# Patient Record
Sex: Male | Born: 1950 | Race: White | Hispanic: No | Marital: Married | Smoking: Never smoker
Health system: Southern US, Community
[De-identification: ages and names within clinical notes are randomized; demographics above are authoritative.]

## PROBLEM LIST (undated history)

## (undated) DIAGNOSIS — K219 Gastro-esophageal reflux disease without esophagitis: Secondary | ICD-10-CM

## (undated) DIAGNOSIS — E785 Hyperlipidemia, unspecified: Secondary | ICD-10-CM

## (undated) DIAGNOSIS — E7800 Pure hypercholesterolemia, unspecified: Secondary | ICD-10-CM

## (undated) DIAGNOSIS — E119 Type 2 diabetes mellitus without complications: Secondary | ICD-10-CM

## (undated) DIAGNOSIS — N200 Calculus of kidney: Secondary | ICD-10-CM

## (undated) DIAGNOSIS — M2550 Pain in unspecified joint: Secondary | ICD-10-CM

## (undated) HISTORY — PX: LITHOTRIPSY: SUR834

---

## 2009-06-24 ENCOUNTER — Ambulatory Visit: Payer: Self-pay | Admitting: Diagnostic Radiology

## 2009-06-25 ENCOUNTER — Emergency Department (HOSPITAL_BASED_OUTPATIENT_CLINIC_OR_DEPARTMENT_OTHER): Admission: EM | Admit: 2009-06-25 | Discharge: 2009-06-25 | Payer: Self-pay | Admitting: Emergency Medicine

## 2013-07-17 ENCOUNTER — Emergency Department (HOSPITAL_BASED_OUTPATIENT_CLINIC_OR_DEPARTMENT_OTHER)
Admission: EM | Admit: 2013-07-17 | Discharge: 2013-07-17 | Disposition: A | Discharge status: Home / Self Care | Payer: 59 | Attending: Emergency Medicine | Admitting: Emergency Medicine

## 2013-07-17 ENCOUNTER — Encounter (HOSPITAL_BASED_OUTPATIENT_CLINIC_OR_DEPARTMENT_OTHER): Payer: Self-pay

## 2013-07-17 ENCOUNTER — Emergency Department (HOSPITAL_BASED_OUTPATIENT_CLINIC_OR_DEPARTMENT_OTHER): Payer: 59

## 2013-07-17 DIAGNOSIS — Z8719 Personal history of other diseases of the digestive system: Secondary | ICD-10-CM | POA: Insufficient documentation

## 2013-07-17 DIAGNOSIS — Z79899 Other long term (current) drug therapy: Secondary | ICD-10-CM | POA: Insufficient documentation

## 2013-07-17 DIAGNOSIS — N200 Calculus of kidney: Secondary | ICD-10-CM

## 2013-07-17 DIAGNOSIS — E119 Type 2 diabetes mellitus without complications: Secondary | ICD-10-CM | POA: Insufficient documentation

## 2013-07-17 HISTORY — DX: Pure hypercholesterolemia, unspecified: E78.00

## 2013-07-17 HISTORY — DX: Pain in unspecified joint: M25.50

## 2013-07-17 HISTORY — DX: Type 2 diabetes mellitus without complications: E11.9

## 2013-07-17 HISTORY — DX: Calculus of kidney: N20.0

## 2013-07-17 HISTORY — DX: Gastro-esophageal reflux disease without esophagitis: K21.9

## 2013-07-17 LAB — URINALYSIS, ROUTINE W REFLEX MICROSCOPIC
Bilirubin Urine: NEGATIVE
Ketones, ur: 15 mg/dL — AB
Leukocytes, UA: NEGATIVE
Nitrite: NEGATIVE
Protein, ur: NEGATIVE mg/dL
Urobilinogen, UA: 1 mg/dL (ref 0.0–1.0)

## 2013-07-17 LAB — URINE MICROSCOPIC-ADD ON

## 2013-07-17 MED ORDER — HYDROCODONE-ACETAMINOPHEN 5-325 MG PO TABS: 1.0000 | ORAL_TABLET | Freq: Three times a day (TID) | ORAL | Status: DC | PRN

## 2013-07-17 MED ORDER — TAMSULOSIN HCL 0.4 MG PO CAPS: 0.4000 mg | ORAL_CAPSULE | Freq: Every day | ORAL | Status: DC

## 2013-07-17 MED ORDER — HYDROMORPHONE HCL PF 1 MG/ML IJ SOLN
1.0000 mg | Freq: Once | INTRAMUSCULAR | Status: AC
  Administered 2013-07-17: 1 mg via INTRAVENOUS
  Filled 2013-07-17: qty 1

## 2013-07-17 MED ORDER — KETOROLAC TROMETHAMINE 30 MG/ML IJ SOLN
30.0000 mg | Freq: Once | INTRAMUSCULAR | Status: AC
  Administered 2013-07-17: 30 mg via INTRAVENOUS
  Filled 2013-07-17: qty 1

## 2013-07-17 MED ORDER — METOCLOPRAMIDE HCL 10 MG PO TABS: 10.0000 mg | ORAL_TABLET | Freq: Four times a day (QID) | ORAL | Status: DC | PRN

## 2013-07-17 MED ORDER — SODIUM CHLORIDE 0.9 % IV BOLUS (SEPSIS)
1000.0000 mL | Freq: Once | INTRAVENOUS | Status: AC
  Administered 2013-07-17: 1000 mL via INTRAVENOUS

## 2013-07-17 NOTE — ED Notes (Signed)
Left flank pain, nausea since 430pm-reports advised he has kidney stones but has never passed one

## 2013-07-17 NOTE — ED Provider Notes (Signed)
CSN: [id number]     Arrival date & time 07/17/13  2142 History     First MD Initiated Contact with Patient 07/17/13 2159     Chief Complaint  Patient presents with  . Flank Pain   (Consider location/radiation/quality/duration/timing/severity/associated sxs/prior Treatment) HPI Patient presents with new left flank pain.  He had one brief episode several days ago, but essentially pain began today.  Since onset there been no clear alleviating or gastric factors.  Pain is severe, sharp.  No associated dysuria, hematuria, other abdominal pain, fevers, chills.  Past Medical History  Diagnosis Date  . Kidney stone   . High cholesterol   . GERD (gastroesophageal reflux disease)   . Diabetes mellitus without complication   . Joint pain    History reviewed. No pertinent past surgical history. No family history on file. History  Substance Use Topics  . Smoking status: Never Smoker   . Smokeless tobacco: Not on file  . Alcohol Use: No    Review of Systems  Constitutional:       Per HPI, otherwise negative  HENT:       Per HPI, otherwise negative  Respiratory:       Per HPI, otherwise negative  Cardiovascular:       Per HPI, otherwise negative  Gastrointestinal: Negative for nausea, vomiting and diarrhea.  Endocrine:       Negative aside from HPI  Genitourinary:       Neg aside from HPI   Musculoskeletal:       Per HPI, otherwise negative  Skin: Negative.   Neurological: Negative for syncope.    Allergies  Review of patient's allergies indicates no known allergies.  Home Medications   Current Outpatient Rx  Name  Route  Sig  Dispense  Refill  . Celecoxib (CELEBREX PO)   Oral   Take by mouth.         . METFORMIN HCL PO   Oral   Take by mouth.         Marland Kitchen SIMVASTATIN PO   Oral   Take by mouth.          BP 146/83  Pulse 86  Temp(Src) 97.7 F (36.5 C) (Oral)  Resp 16  Ht 6\' 1"  (1.854 m)  Wt 190 lb (86.183 kg)  BMI 25.07 kg/m2  SpO2 97% Physical Exam   Nursing note and vitals reviewed. Constitutional: He is oriented to person, place, and time. He appears well-developed. No distress.  HENT:  Head: Normocephalic and atraumatic.  Eyes: Conjunctivae and EOM are normal.  Cardiovascular: Normal rate and regular rhythm.   Pulmonary/Chest: Effort normal. No stridor. No respiratory distress.  Abdominal: He exhibits no distension. There is no tenderness.  Musculoskeletal: He exhibits no edema.  Neurological: He is alert and oriented to person, place, and time.  Skin: Skin is warm and dry.  Psychiatric: He has a normal mood and affect.    ED Course   Procedures (including critical care time)  Labs Reviewed  URINALYSIS, ROUTINE W REFLEX MICROSCOPIC - Abnormal; Notable for the following:    Hgb urine dipstick SMALL (*)    Ketones, ur 15 (*)    All other components within normal limits  URINE MICROSCOPIC-ADD ON   No results found. No diagnosis found.   11:38 PM I reviewed the CT imaging, demonstrated images to the patient and his wife.  MDM  This generally well-appearing male presents with new left flank pain.  On exam he is afebrile, in  no distress.  Patient has evidence of a 4 mm stone in the left proximal ureter.  No suggestion of infection.  Patient discharged to follow up with urology.  Gerhard Munch, MD 07/17/13 [phone]

## 2013-07-17 NOTE — ED Notes (Signed)
Pt aware of need for urine specimen. Urinal at bedside

## 2016-12-14 ENCOUNTER — Encounter (HOSPITAL_COMMUNITY): Payer: Self-pay | Admitting: Emergency Medicine

## 2016-12-14 ENCOUNTER — Emergency Department (HOSPITAL_COMMUNITY): Payer: 59

## 2016-12-14 ENCOUNTER — Emergency Department (HOSPITAL_COMMUNITY)
Admission: EM | Admit: 2016-12-14 | Discharge: 2016-12-14 | Disposition: A | Discharge status: Home / Self Care | Payer: 59 | Attending: Emergency Medicine | Admitting: Emergency Medicine

## 2016-12-14 DIAGNOSIS — Y999 Unspecified external cause status: Secondary | ICD-10-CM | POA: Diagnosis not present

## 2016-12-14 DIAGNOSIS — S0101XA Laceration without foreign body of scalp, initial encounter: Secondary | ICD-10-CM

## 2016-12-14 DIAGNOSIS — W19XXXA Unspecified fall, initial encounter: Secondary | ICD-10-CM

## 2016-12-14 DIAGNOSIS — Y9301 Activity, walking, marching and hiking: Secondary | ICD-10-CM | POA: Insufficient documentation

## 2016-12-14 DIAGNOSIS — W01198A Fall on same level from slipping, tripping and stumbling with subsequent striking against other object, initial encounter: Secondary | ICD-10-CM | POA: Insufficient documentation

## 2016-12-14 DIAGNOSIS — S0990XA Unspecified injury of head, initial encounter: Secondary | ICD-10-CM | POA: Diagnosis present

## 2016-12-14 DIAGNOSIS — Z7982 Long term (current) use of aspirin: Secondary | ICD-10-CM | POA: Insufficient documentation

## 2016-12-14 DIAGNOSIS — E119 Type 2 diabetes mellitus without complications: Secondary | ICD-10-CM | POA: Insufficient documentation

## 2016-12-14 DIAGNOSIS — Y92009 Unspecified place in unspecified non-institutional (private) residence as the place of occurrence of the external cause: Secondary | ICD-10-CM | POA: Diagnosis not present

## 2016-12-14 MED ORDER — TETANUS-DIPHTH-ACELL PERTUSSIS 5-2.5-18.5 LF-MCG/0.5 IM SUSP
0.5000 mL | Freq: Once | INTRAMUSCULAR | Status: AC
  Administered 2016-12-14: 0.5 mL via INTRAMUSCULAR
  Filled 2016-12-14: qty 0.5

## 2016-12-14 MED ORDER — ACETAMINOPHEN 500 MG PO TABS
1000.0000 mg | ORAL_TABLET | Freq: Once | ORAL | Status: AC
  Administered 2016-12-14: 1000 mg via ORAL
  Filled 2016-12-14: qty 2

## 2016-12-14 MED ORDER — LIDOCAINE-EPINEPHRINE 1 %-1:100000 IJ SOLN
20.0000 mL | Freq: Once | INTRAMUSCULAR | Status: AC
  Administered 2016-12-14: 20 mL via INTRADERMAL
  Filled 2016-12-14: qty 20

## 2016-12-14 NOTE — ED Notes (Signed)
Pt reports slipping on ice in their driveway while walking their dog this am.  Pt reports falling backwards, hitting the back of his head.  Reports LOC.  Pt is A&Ox 4. Denies any n/v or dizziness at this time.  Lac noted to back of his head.

## 2016-12-14 NOTE — ED Triage Notes (Addendum)
Pt reports that he was out walking the dog and slipped on the ice,. Does not think he passed out. Pt has a large abrasion on back of head. Raised area noted. Denies use of blood thinners.  Small abrasion noted on r/wrist. Slight swelling noted on wrist. Pt is alert, oriented and ambulatory. Wife at bedside.

## 2016-12-14 NOTE — ED Triage Notes (Signed)
Per EMS- Family called EMS, pt slipped and fell on the ice one hour ago. Abrasion to back of head, bleeding controlled. Pt denies LOC. Pt is alert, oriented and ambulatory. Denies blood thinners

## 2016-12-14 NOTE — ED Provider Notes (Signed)
[facility]-EMERGENCY DEPT Provider Note   CSN: [id number] Arrival date & time: 12/14/16  1016     History   Chief Complaint Chief Complaint  Patient presents with  . Fall  . Head Injury    HPI Raymond Stuart is a 66 y.o. male.  HPI Raymond Stuart is a 66 y.o. male with PMH significant for DM, GERD, nephrolithiasis, and hyperlipidemia who presents with fall and subsequent head injury.  Patient states he was walking his dog this morning when he slipped on ice and fell backwards striking his head against the concrete.  He also has small abrasion to volar wrist.  No anticoagulation/antiplatelet use.  Unsure last tetanus.  Unsure LOC, maybe "just a couple of seconds".  No diplopia, N/V, neck pain, back pain, numbness, or weakness.  Ambulatory since event. No preceding dizziness, syncope, CP, or SOB.  Past Medical History:  Diagnosis Date  . Diabetes mellitus without complication (HCC)   . GERD (gastroesophageal reflux disease)   . High cholesterol   . Joint pain   . Kidney stone     There are no active problems to display for this patient.   Past Surgical History:  Procedure Laterality Date  . LITHOTRIPSY         Home Medications    Prior to Admission medications   Medication Sig Start Date End Date Taking? Authorizing Provider  aspirin 81 MG chewable tablet Chew 81 mg by mouth daily.   Yes Historical Provider, MD  calcium carbonate (TUMS - DOSED IN MG ELEMENTAL CALCIUM) 500 MG chewable tablet Chew 1 tablet by mouth daily.   Yes Historical Provider, MD  cholecalciferol (VITAMIN D) 1000 units tablet Take 1,000 Units by mouth daily.   Yes Historical Provider, MD  Chromium 200 MCG CAPS Take 200 mcg by mouth 2 (two) times daily.   Yes Historical Provider, MD  Cinnamon 500 MG capsule Take 500 mg by mouth 2 (two) times daily.   Yes Historical Provider, MD  co-enzyme Q-10 30 MG capsule Take 250 mg by mouth daily.   Yes Historical Provider, MD  esomeprazole (NEXIUM) 20 MG capsule Take 20  mg by mouth daily at 12 noon.   Yes Historical Provider, MD  magnesium gluconate (MAGONATE) 500 MG tablet Take 500 mg by mouth daily.   Yes Historical Provider, MD  OVER THE COUNTER MEDICATION Take 1 capsule by mouth 2 (two) times daily. Alean Rinnesteoha   Yes Historical Provider, MD  simvastatin (ZOCOR) 40 MG tablet Take 40 mg by mouth at bedtime.  11/12/16  Yes Historical Provider, MD  triamcinolone (NASACORT ALLERGY 24HR) 55 MCG/ACT AERO nasal inhaler Place 2 sprays into the nose at bedtime.   Yes Historical Provider, MD  Turmeric 500 MG CAPS Take 500 mg by mouth daily.   Yes Historical Provider, MD  HYDROcodone-acetaminophen (NORCO/VICODIN) 5-325 MG per tablet Take 1 tablet by mouth every 8 (eight) hours as needed for pain. Patient not taking: Reported on 12/14/2016 07/17/13   Gerhard Munchobert Lockwood, MD  metoCLOPramide (REGLAN) 10 MG tablet Take 1 tablet (10 mg total) by mouth every 6 (six) hours as needed (nausea). Patient not taking: Reported on 12/14/2016 07/17/13   Gerhard Munchobert Lockwood, MD  tamsulosin (FLOMAX) 0.4 MG CAPS capsule Take 1 capsule (0.4 mg total) by mouth daily. Patient not taking: Reported on 12/14/2016 07/17/13   Gerhard Munchobert Lockwood, MD    Family History Family History  Problem Relation Age of Onset  . Hypertension Mother     Social History Social History  Substance  Use Topics  . Smoking status: Never Smoker  . Smokeless tobacco: Never Used  . Alcohol use No     Allergies   Patient has no known allergies.   Review of Systems Review of Systems All other systems negative unless otherwise stated in HPI   Physical Exam Updated Vital Signs BP 128/76   Pulse 97   Temp 98 F (36.7 C) (Oral)   Resp 16   Wt 93 kg   SpO2 98%   BMI 27.05 kg/m   Physical Exam  Constitutional: He is oriented to person, place, and time. He appears well-developed and well-nourished.  Non-toxic appearance. He does not have a sickly appearance. He does not appear ill.  HENT:  Head: Normocephalic.    Mouth/Throat: Oropharynx is clear and moist.  Hematoma to posterior scalp with 1.5 cm laceration.  Bleeding controlled with pressure.  No FBs seen or palpated within a bloodless field.   Eyes: Conjunctivae are normal. Pupils are equal, round, and reactive to light.  Neck: Normal range of motion. Neck supple.  No cervical midline tenderness.   Cardiovascular: Normal rate and regular rhythm.   Pulmonary/Chest: Effort normal and breath sounds normal. No accessory muscle usage or stridor. No respiratory distress. He has no wheezes. He has no rhonchi. He has no rales.  Abdominal: Soft. Bowel sounds are normal. He exhibits no distension. There is no tenderness.  Musculoskeletal: Normal range of motion.  No thoracic or lumbar midline tenderness.   Lymphadenopathy:    He has no cervical adenopathy.  Neurological: He is alert and oriented to person, place, and time.  Mental Status:   AOx3.  Speech clear without dysarthria. Cranial Nerves:  I-not tested  II-PERRLA  III, IV, VI-EOMs intact  V-temporal and masseter strength intact  VII-symmetrical facial movements intact, no facial droop  VIII-hearing grossly intact bilaterally  IX, X-gag intact  XI-strength of sternomastoid and trapezius muscles 5/5  XII-tongue midline Motor:   Good muscle bulk and tone  Strength 5/5 bilaterally in upper and lower extremities   Cerebellar--intact RAMs, finger to nose intact bilaterally.  Gait normal  No pronator drift Sensory:  Intact in upper and lower extremities  Skin: Skin is warm and dry.  Psychiatric: He has a normal mood and affect. His behavior is normal.     ED Treatments / Results  Labs (all labs ordered are listed, but only abnormal results are displayed) Labs Reviewed - No data to display  EKG  EKG Interpretation None       Radiology Ct Head Wo Contrast  Result Date: 12/14/2016 CLINICAL DATA:  Lare[place] Seat at home. Slipped on ice. Hit back of head. Laceration. Possible loss of  consciousness. EXAM: CT HEAD WITHOUT CONTRAST CT CERVICAL SPINE WITHOUT CONTRAST TECHNIQUE: Multidetector CT imaging of the head and cervical spine was performed following the standard protocol without intravenous contrast. Multiplanar CT image reconstructions of the cervical spine were also generated. COMPARISON:  None. FINDINGS: CT HEAD FINDINGS Brain: No acute infarct, hemorrhage, or mass lesion is present. The ventricles are of normal size. No significant extraaxial fluid collection is present. Vascular: No hyperdense vessel or unexpected calcification. Skull: Soft tissue swelling and hematoma is seen posteriorly near the vertex. There is no underlying fracture. No foreign body is present. Sinuses/Orbits: The left sphenoid sinus is near completely opacified. This appears chronic. Circumferential mucosal thickening and wall thickening is present in the right maxillary sinus. This also appears chronic. Remaining paranasal sinuses and the mastoid air cells are clear. CT  CERVICAL SPINE FINDINGS Alignment: AP alignment is anatomic. Skull base and vertebrae: The skullbase is within normal limits. Craniocervical junction is unremarkable. No acute fracture is present. Incomplete fusion of posterior C1 arch is noted. Soft tissues and spinal canal: The spinal canal is grossly patent. The soft tissues the neck demonstrate minimal atherosclerotic calcifications on the right. No other focal lesions are present. Disc levels: Chronic endplate change in uncovertebral spurring is most evident at C6-7 with osseous foraminal narrowing bilaterally. Upper chest: The lung apices are clear. The superior mediastinum is unremarkable. IMPRESSION: 1. Soft tissue swelling and hematoma along the posterior scalp near the vertex without underlying fracture. 2. Normal CT appearance of the brain. 3. Sinus disease as described. 4. 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  Electronically Signed   By: Marin Roberts M.D.   On: 12/14/2016 14:28   Ct Cervical Spine Wo Contrast  Result Date: 12/14/2016 CLINICAL DATA:  [place] Seat at home. Slipped on ice. Hit back of head. Laceration. Possible loss of consciousness. EXAM: CT HEAD WITHOUT CONTRAST CT CERVICAL SPINE WITHOUT CONTRAST TECHNIQUE: Multidetector CT imaging of the head and cervical spine was performed following the standard protocol without intravenous contrast. Multiplanar CT image reconstructions of the cervical spine were also generated. COMPARISON:  None. FINDINGS: CT HEAD FINDINGS Brain: No acute infarct, hemorrhage, or mass lesion is present. The ventricles are of normal size. No significant extraaxial fluid collection is present. Vascular: No hyperdense vessel or unexpected calcification. Skull: Soft tissue swelling and hematoma is seen posteriorly near the vertex. There is no underlying fracture. No foreign body is present. Sinuses/Orbits: The left sphenoid sinus is near completely opacified. This appears chronic. Circumferential mucosal thickening and wall thickening is present in the right maxillary sinus. This also appears chronic. Remaining paranasal sinuses and the mastoid air cells are clear. CT CERVICAL SPINE FINDINGS Alignment: AP alignment is anatomic. Skull base and vertebrae: The skullbase is within normal limits. Craniocervical junction is unremarkable. No acute fracture is present. Incomplete fusion of posterior C1 arch is noted. Soft tissues and spinal canal: The spinal canal is grossly patent. The soft tissues the neck demonstrate minimal atherosclerotic calcifications on the right. No other focal lesions are present. Disc levels: Chronic endplate change in uncovertebral spurring is most evident at C6-7 with osseous foraminal narrowing bilaterally. Upper chest: The lung apices are clear. The superior mediastinum is unremarkable. IMPRESSION: 1. Soft tissue swelling and hematoma along the posterior scalp  near the vertex without underlying fracture. 2. Normal CT appearance of the brain. 3. Sinus disease as described. 4. 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 Electronically Signed   By: Marin Roberts M.D.   On: 12/14/2016 14:28    Procedures Procedures (including critical care time)  LACERATION REPAIR Performed by: Cheri Fowler Consent: Verbal consent obtained. Risks and benefits: risks, benefits and alternatives were discussed Patient identity confirmed: provided demographic data Time out performed prior to procedure Prepped and Draped in normal sterile fashion Wound explored Laceration Location: posterior scalp Laceration Length: 1.5cm No Foreign Bodies seen or palpated Anesthesia: local infiltration Local anesthetic: lidocaine 1% with epinephrine Anesthetic total: 8 ml Irrigation method: syringe Amount of cleaning: standard Skin closure: staple Number of sutures or staples: 3 Technique: staples Patient tolerance: Patient tolerated the procedure well with no immediate complications.   Medications Ordered in ED Medications  lidocaine-EPINEPHrine (XYLOCAINE W/EPI) 1 %-1:100000 (with pres) injection 20  mL (not administered)  Tdap (BOOSTRIX) injection 0.5 mL (0.5 mLs Intramuscular Given 12/14/16 1439)  acetaminophen (TYLENOL) tablet 1,000 mg (1,000 mg Oral Given 12/14/16 1438)     Initial Impression / Assessment and Plan / ED Course  I have reviewed the triage vital signs and the nursing notes.  Pertinent labs & imaging results that were available during my care of the patient were reviewed by me and considered in my medical decision making (see chart for details).     Patient presents s/p mechanical fall now with head lac.  No focal neurological deficits.  CT head and cervical spine without acute abnormalities.  Lac repaired with 3 staples.  Tetanus up dated.  Strict return precautions.  Follow up  in 7 days for staple removal.  Stable for discharge.   Final Clinical Impressions(s) / ED Diagnoses   Final diagnoses:  Fall, initial encounter  Laceration of scalp without foreign body, initial encounter    New Prescriptions New Prescriptions   No medications on file     Cheri Fowler, PA-C 12/14/16 1505    Pricilla Loveless, MD 12/20/16 2319

## 2016-12-14 NOTE — Discharge Instructions (Signed)
Your head and cervical spine CTs are normal.  We placed 3 staples.  These need to be removed in 7 days.  You may take Ibuprofen or Tylenol for pain.  Return to the ED for visual changes, persistent vomiting, numbness, weakness, or any new or concerning symptoms.

## 2019-12-18 ENCOUNTER — Ambulatory Visit: Payer: Medicare Other | Attending: Internal Medicine

## 2019-12-18 DIAGNOSIS — Z23 Encounter for immunization: Secondary | ICD-10-CM | POA: Insufficient documentation

## 2019-12-18 NOTE — Progress Notes (Signed)
   Covid-19 Vaccination Clinic  Name:  Raymond Stuart    MRN: [id number] DOB: [birth year]  12/18/2019  [name] was observed post Covid-19 immunization for 15 minutes without incidence. He was provided with Vaccine Information Sheet and instruction to access the V-Safe system.   [name] was instructed to call 911 with any severe reactions post vaccine: Marland Kitchen Difficulty breathing  . Swelling of your face and throat  . A fast heartbeat  . A bad rash all over your body  . Dizziness and weakness    Immunizations Administered    Name Date Dose VIS Date Route   Pfizer COVID-19 Vaccine 12/18/2019  9:02 AM 0.3 mL 11/06/2019 Intramuscular   Manufacturer: [employer], [employer]   Lot: [id number]   NDC: [address]-2

## 2020-01-07 ENCOUNTER — Ambulatory Visit: Payer: Medicare Other | Attending: Internal Medicine

## 2020-01-07 DIAGNOSIS — Z23 Encounter for immunization: Secondary | ICD-10-CM

## 2020-01-07 NOTE — Progress Notes (Signed)
   Covid-19 Vaccination Clinic  Name:  Raymond Stuart    MRN: [id number] DOB: [birth year]  01/07/2020  [name] was observed post Covid-19 immunization for 15 minutes without incidence. He was provided with Vaccine Information Sheet and instruction to access the V-Safe system.   [name] was instructed to call 911 with any severe reactions post vaccine: Marland Kitchen Difficulty breathing  . Swelling of your face and throat  . A fast heartbeat  . A bad rash all over your body  . Dizziness and weakness    Immunizations Administered    Name Date Dose VIS Date Route   Pfizer COVID-19 Vaccine 01/07/2020  9:05 AM 0.3 mL 11/06/2019 Intramuscular   Manufacturer: [employer], [employer]   Lot: [id number]   NDC: [address]-5

## 2021-09-27 ENCOUNTER — Encounter (HOSPITAL_COMMUNITY): Payer: Self-pay

## 2021-09-27 ENCOUNTER — Emergency Department (HOSPITAL_COMMUNITY): Payer: Medicare Other

## 2021-09-27 ENCOUNTER — Other Ambulatory Visit: Payer: Self-pay

## 2021-09-27 ENCOUNTER — Inpatient Hospital Stay (HOSPITAL_COMMUNITY)
Admission: EM | Admit: 2021-09-27 | Discharge: 2021-10-10 | DRG: 698 | Disposition: A | Discharge status: Home / Self Care | Payer: Medicare Other | Attending: Internal Medicine | Admitting: Internal Medicine

## 2021-09-27 DIAGNOSIS — N319 Neuromuscular dysfunction of bladder, unspecified: Secondary | ICD-10-CM | POA: Diagnosis present

## 2021-09-27 DIAGNOSIS — Z79899 Other long term (current) drug therapy: Secondary | ICD-10-CM

## 2021-09-27 DIAGNOSIS — Z6823 Body mass index (BMI) 23.0-23.9, adult: Secondary | ICD-10-CM

## 2021-09-27 DIAGNOSIS — E8809 Other disorders of plasma-protein metabolism, not elsewhere classified: Secondary | ICD-10-CM | POA: Diagnosis present

## 2021-09-27 DIAGNOSIS — B370 Candidal stomatitis: Secondary | ICD-10-CM | POA: Diagnosis present

## 2021-09-27 DIAGNOSIS — B965 Pseudomonas (aeruginosa) (mallei) (pseudomallei) as the cause of diseases classified elsewhere: Secondary | ICD-10-CM | POA: Diagnosis present

## 2021-09-27 DIAGNOSIS — D649 Anemia, unspecified: Secondary | ICD-10-CM | POA: Diagnosis present

## 2021-09-27 DIAGNOSIS — R197 Diarrhea, unspecified: Secondary | ICD-10-CM | POA: Diagnosis present

## 2021-09-27 DIAGNOSIS — J9601 Acute respiratory failure with hypoxia: Secondary | ICD-10-CM | POA: Diagnosis present

## 2021-09-27 DIAGNOSIS — L89152 Pressure ulcer of sacral region, stage 2: Secondary | ICD-10-CM | POA: Diagnosis present

## 2021-09-27 DIAGNOSIS — R7989 Other specified abnormal findings of blood chemistry: Secondary | ICD-10-CM

## 2021-09-27 DIAGNOSIS — Z96641 Presence of right artificial hip joint: Secondary | ICD-10-CM | POA: Diagnosis present

## 2021-09-27 DIAGNOSIS — R7401 Elevation of levels of liver transaminase levels: Secondary | ICD-10-CM | POA: Diagnosis present

## 2021-09-27 DIAGNOSIS — E1165 Type 2 diabetes mellitus with hyperglycemia: Secondary | ICD-10-CM | POA: Diagnosis present

## 2021-09-27 DIAGNOSIS — E871 Hypo-osmolality and hyponatremia: Secondary | ICD-10-CM | POA: Diagnosis present

## 2021-09-27 DIAGNOSIS — E43 Unspecified severe protein-calorie malnutrition: Secondary | ICD-10-CM | POA: Insufficient documentation

## 2021-09-27 DIAGNOSIS — R6520 Severe sepsis without septic shock: Secondary | ICD-10-CM | POA: Diagnosis present

## 2021-09-27 DIAGNOSIS — T83518A Infection and inflammatory reaction due to other urinary catheter, initial encounter: Principal | ICD-10-CM | POA: Diagnosis present

## 2021-09-27 DIAGNOSIS — M25562 Pain in left knee: Secondary | ICD-10-CM

## 2021-09-27 DIAGNOSIS — A419 Sepsis, unspecified organism: Secondary | ICD-10-CM | POA: Diagnosis present

## 2021-09-27 DIAGNOSIS — R0902 Hypoxemia: Secondary | ICD-10-CM

## 2021-09-27 DIAGNOSIS — E872 Acidosis, unspecified: Secondary | ICD-10-CM | POA: Diagnosis present

## 2021-09-27 DIAGNOSIS — R627 Adult failure to thrive: Secondary | ICD-10-CM | POA: Diagnosis present

## 2021-09-27 DIAGNOSIS — Y846 Urinary catheterization as the cause of abnormal reaction of the patient, or of later complication, without mention of misadventure at the time of the procedure: Secondary | ICD-10-CM | POA: Diagnosis present

## 2021-09-27 DIAGNOSIS — I69354 Hemiplegia and hemiparesis following cerebral infarction affecting left non-dominant side: Secondary | ICD-10-CM

## 2021-09-27 DIAGNOSIS — R0602 Shortness of breath: Secondary | ICD-10-CM

## 2021-09-27 DIAGNOSIS — Z888 Allergy status to other drugs, medicaments and biological substances status: Secondary | ICD-10-CM

## 2021-09-27 DIAGNOSIS — E785 Hyperlipidemia, unspecified: Secondary | ICD-10-CM | POA: Diagnosis present

## 2021-09-27 DIAGNOSIS — Z20822 Contact with and (suspected) exposure to covid-19: Secondary | ICD-10-CM | POA: Diagnosis present

## 2021-09-27 DIAGNOSIS — Z8249 Family history of ischemic heart disease and other diseases of the circulatory system: Secondary | ICD-10-CM

## 2021-09-27 DIAGNOSIS — T17908A Unspecified foreign body in respiratory tract, part unspecified causing other injury, initial encounter: Secondary | ICD-10-CM | POA: Diagnosis present

## 2021-09-27 DIAGNOSIS — Z7982 Long term (current) use of aspirin: Secondary | ICD-10-CM

## 2021-09-27 DIAGNOSIS — Z7401 Bed confinement status: Secondary | ICD-10-CM

## 2021-09-27 DIAGNOSIS — B961 Klebsiella pneumoniae [K. pneumoniae] as the cause of diseases classified elsewhere: Secondary | ICD-10-CM | POA: Diagnosis present

## 2021-09-27 DIAGNOSIS — E440 Moderate protein-calorie malnutrition: Secondary | ICD-10-CM | POA: Diagnosis present

## 2021-09-27 DIAGNOSIS — I69391 Dysphagia following cerebral infarction: Secondary | ICD-10-CM

## 2021-09-27 DIAGNOSIS — A4159 Other Gram-negative sepsis: Secondary | ICD-10-CM | POA: Diagnosis present

## 2021-09-27 DIAGNOSIS — L8990 Pressure ulcer of unspecified site, unspecified stage: Secondary | ICD-10-CM | POA: Insufficient documentation

## 2021-09-27 DIAGNOSIS — K219 Gastro-esophageal reflux disease without esophagitis: Secondary | ICD-10-CM | POA: Diagnosis present

## 2021-09-27 DIAGNOSIS — I700 Atherosclerosis of aorta: Secondary | ICD-10-CM | POA: Diagnosis present

## 2021-09-27 DIAGNOSIS — N3000 Acute cystitis without hematuria: Secondary | ICD-10-CM

## 2021-09-27 DIAGNOSIS — N390 Urinary tract infection, site not specified: Secondary | ICD-10-CM | POA: Diagnosis present

## 2021-09-27 DIAGNOSIS — J690 Pneumonitis due to inhalation of food and vomit: Secondary | ICD-10-CM | POA: Diagnosis present

## 2021-09-27 DIAGNOSIS — E876 Hypokalemia: Secondary | ICD-10-CM | POA: Diagnosis not present

## 2021-09-27 HISTORY — DX: Hyperlipidemia, unspecified: E78.5

## 2021-09-27 LAB — COMPREHENSIVE METABOLIC PANEL
ALT: 78 U/L — ABNORMAL HIGH (ref 0–44)
AST: 46 U/L — ABNORMAL HIGH (ref 15–41)
Albumin: 2.6 g/dL — ABNORMAL LOW (ref 3.5–5.0)
Alkaline Phosphatase: 160 U/L — ABNORMAL HIGH (ref 38–126)
Anion gap: 9 (ref 5–15)
BUN: 27 mg/dL — ABNORMAL HIGH (ref 8–23)
CO2: 23 mmol/L (ref 22–32)
Calcium: 8.7 mg/dL — ABNORMAL LOW (ref 8.9–10.3)
Chloride: 105 mmol/L (ref 98–111)
Creatinine, Ser: 0.91 mg/dL (ref 0.61–1.24)
GFR, Estimated: >60 mL/min (ref >60)
Glucose, Bld: 161 mg/dL — ABNORMAL HIGH (ref 70–99)
Potassium: 4.4 mmol/L (ref 3.5–5.1)
Sodium: 137 mmol/L (ref 135–145)
Total Bilirubin: 0.9 mg/dL (ref 0.3–1.2)
Total Protein: 7 g/dL (ref 6.5–8.1)

## 2021-09-27 LAB — RESP PANEL BY RT-PCR (FLU A&B, COVID) ARPGX2
Influenza A by PCR: NEGATIVE
Influenza B by PCR: NEGATIVE
SARS Coronavirus 2 by RT PCR: NEGATIVE

## 2021-09-27 LAB — LACTIC ACID, PLASMA
Lactic Acid, Venous: 3 mmol/L (ref 0.5–1.9)
Lactic Acid, Venous: 3.1 mmol/L (ref 0.5–1.9)
Lactic Acid, Venous: 2.6 mmol/L (ref 0.5–1.9)
Lactic Acid, Venous: 1.8 mmol/L (ref 0.5–1.9)

## 2021-09-27 LAB — GLUCOSE, CAPILLARY
Glucose-Capillary: 104 mg/dL — ABNORMAL HIGH (ref 70–99)
Glucose-Capillary: 89 mg/dL (ref 70–99)

## 2021-09-27 LAB — CBC WITH DIFFERENTIAL/PLATELET
Abs Immature Granulocytes: 0.16 10*3/uL — ABNORMAL HIGH (ref 0.00–0.07)
Basophils Absolute: 0 10*3/uL (ref 0.0–0.1)
Basophils Relative: 0 %
Eosinophils Absolute: 0 10*3/uL (ref 0.0–0.5)
Eosinophils Relative: 0 %
HCT: 42.6 % (ref 39.0–52.0)
Hemoglobin: 13.6 g/dL (ref 13.0–17.0)
Immature Granulocytes: 1 %
Lymphocytes Relative: 12 %
Lymphs Abs: 1.9 10*3/uL (ref 0.7–4.0)
MCH: 28.9 pg (ref 26.0–34.0)
MCHC: 31.9 g/dL (ref 30.0–36.0)
MCV: 90.6 fL (ref 80.0–100.0)
Monocytes Absolute: 1.2 10*3/uL — ABNORMAL HIGH (ref 0.1–1.0)
Monocytes Relative: 8 %
Neutro Abs: 12.1 10*3/uL — ABNORMAL HIGH (ref 1.7–7.7)
Neutrophils Relative %: 79 %
Platelets: 280 10*3/uL (ref 150–400)
RBC: 4.7 MIL/uL (ref 4.22–5.81)
RDW: 14.3 % (ref 11.5–15.5)
WBC: 15.3 10*3/uL — ABNORMAL HIGH (ref 4.0–10.5)
nRBC: 0 % (ref 0.0–0.2)

## 2021-09-27 LAB — URINALYSIS, ROUTINE W REFLEX MICROSCOPIC
Bilirubin Urine: NEGATIVE
Glucose, UA: NEGATIVE mg/dL
Ketones, ur: NEGATIVE mg/dL
Nitrite: NEGATIVE
Protein, ur: 100 mg/dL — AB
RBC / HPF: >50 RBC/hpf — ABNORMAL HIGH (ref 0–5)
Specific Gravity, Urine: 1.026 (ref 1.005–1.030)
WBC, UA: >50 WBC/hpf — ABNORMAL HIGH (ref 0–5)
pH: 5 (ref 5.0–8.0)

## 2021-09-27 LAB — PROTIME-INR
INR: 1.1 (ref 0.8–1.2)
Prothrombin Time: 13.9 seconds (ref 11.4–15.2)

## 2021-09-27 LAB — APTT: aPTT: 31 seconds (ref 24–36)

## 2021-09-27 MED ORDER — ACETAMINOPHEN 650 MG RE SUPP: 650.0000 mg | Freq: Four times a day (QID) | RECTAL | Status: DC | PRN

## 2021-09-27 MED ORDER — ONDANSETRON HCL 4 MG PO TABS: 4.0000 mg | ORAL_TABLET | Freq: Four times a day (QID) | ORAL | Status: DC | PRN

## 2021-09-27 MED ORDER — INSULIN ASPART 100 UNIT/ML IJ SOLN: 0.0000 [IU] | Freq: Four times a day (QID) | INTRAMUSCULAR | Status: DC

## 2021-09-27 MED ORDER — ENOXAPARIN SODIUM 40 MG/0.4ML IJ SOSY
40.0000 mg | PREFILLED_SYRINGE | INTRAMUSCULAR | Status: DC
  Administered 2021-09-27: 40 mg via SUBCUTANEOUS
  Filled 2021-09-27: qty 0.4

## 2021-09-27 MED ORDER — SODIUM CHLORIDE 0.9 % IV SOLN
2.0000 g | INTRAVENOUS | Status: DC
  Administered 2021-09-27: 2 g via INTRAVENOUS
  Filled 2021-09-27: qty 20

## 2021-09-27 MED ORDER — ACETAMINOPHEN 325 MG PO TABS: 650.0000 mg | ORAL_TABLET | Freq: Four times a day (QID) | ORAL | Status: DC | PRN

## 2021-09-27 MED ORDER — LACTATED RINGERS IV BOLUS (SEPSIS)
1500.0000 mL | Freq: Once | INTRAVENOUS | Status: AC
  Administered 2021-09-27: 1500 mL via INTRAVENOUS

## 2021-09-27 MED ORDER — SODIUM CHLORIDE 0.9 % IV SOLN
2.0000 g | Freq: Three times a day (TID) | INTRAVENOUS | Status: DC
  Administered 2021-09-27 – 2021-09-28 (×3): 2 g via INTRAVENOUS
  Filled 2021-09-27 (×3): qty 2

## 2021-09-27 MED ORDER — CHLORHEXIDINE GLUCONATE CLOTH 2 % EX PADS
6.0000 | MEDICATED_PAD | Freq: Every day | CUTANEOUS | Status: DC
  Administered 2021-09-27 – 2021-10-10 (×14): 6 via TOPICAL

## 2021-09-27 MED ORDER — LACTATED RINGERS IV SOLN: INTRAVENOUS | Status: AC

## 2021-09-27 MED ORDER — LACTATED RINGERS IV BOLUS
1000.0000 mL | Freq: Once | INTRAVENOUS | Status: AC
  Administered 2021-09-27: 1000 mL via INTRAVENOUS

## 2021-09-27 MED ORDER — ACETAMINOPHEN 650 MG RE SUPP
650.0000 mg | Freq: Once | RECTAL | Status: AC
  Administered 2021-09-27: 650 mg via RECTAL
  Filled 2021-09-27: qty 1

## 2021-09-27 MED ORDER — SODIUM CHLORIDE 0.9 % IV SOLN
2.0000 g | Freq: Once | INTRAVENOUS | Status: AC
  Administered 2021-09-27: 2 g via INTRAVENOUS
  Filled 2021-09-27: qty 2

## 2021-09-27 MED ORDER — ONDANSETRON HCL 4 MG/2ML IJ SOLN
4.0000 mg | Freq: Four times a day (QID) | INTRAMUSCULAR | Status: DC | PRN
  Administered 2021-10-06: 4 mg via INTRAVENOUS
  Filled 2021-09-27: qty 2

## 2021-09-27 MED ORDER — ORAL CARE MOUTH RINSE
15.0000 mL | Freq: Two times a day (BID) | OROMUCOSAL | Status: DC
  Administered 2021-09-27 – 2021-10-10 (×27): 15 mL via OROMUCOSAL

## 2021-09-27 MED ORDER — SODIUM CHLORIDE 0.9 % IV SOLN
500.0000 mg | INTRAVENOUS | Status: DC
  Administered 2021-09-27: 500 mg via INTRAVENOUS
  Filled 2021-09-27: qty 500

## 2021-09-27 MED ORDER — LACTATED RINGERS IV BOLUS (SEPSIS)
1000.0000 mL | Freq: Once | INTRAVENOUS | Status: AC
  Administered 2021-09-27: 1000 mL via INTRAVENOUS

## 2021-09-27 NOTE — H&P (Signed)
History and Physical    Darey Hershberger SNK:[id number] DOB: May 10, 1951 DOA: 09/27/2021  PCP: Caffie Damme, MD   Patient coming from: SNF.  I have personally briefly reviewed patient's old medical records in [facility]  Chief Complaint: Aspiration.  HPI: Raymond Stuart is a 70 y.o. male with medical history significant of GERD hyperlipidemia, osteoarthritis, urolithiasis who was recently admitted to [facility] and then [facility] after having subdural hematoma, traumatic intraparenchymal hemorrhage, ventricular hemorrhage in the setting of acute head injury with an occipital fracture, ischemic stroke, followed by aspiration pneumonia in the setting of stroke dysphagia, PEG placement, transaminitis, AKI, acute urinary retention with indwelling Foley catheter who was transferred from his facility this morning after being found covered in vomit, febrile at 101.2 F, tachypneic/dyspneic and hypoxic in the 80s.  He responded to NRB oxygen at 10 LPM then subsequently brought to the emergency department.  At the facility, he was started getting tube feeding around 2300 last night.  He has been bedbound.  A significant portion of the history is taken from his wife, but he was able to say a few things and answer simple questions.  No headache, sore throat, chest pain, back or abdominal pain at this time.  ED Course: Initial vital signs were temperature 101.2 F, pulse 120, respirations 25, BP 122/85 mmHg and O2 sat 94% on NRB mask.  His O2 oxygen requirement has improved as he is satting mid 90s to 100% on nasal cannula oxygen at 3 LPM.  Lab work: His urinalysis from a catheterized sample was cloudy, showed large hemoglobin, proteinuria 100 mg/dL and moderate leukocyte esterase.  Microscopic examination with more than 50 RBC and more than 50 WBC with many bacteria, presence of mucus and WBC clumps.  CBC is her white count of 15.3, hemoglobin 13.6 g/dL platelets 937.  PT/INR/PTT within normal limits.  Lactic acid was 3.0,  then 3.1 and then 2.6 mmol/CMP showed normal electrolytes when calcium corrected to albumin.  Glucose 161, BUN 27 creatinine 0.91 mg deciliter with a GFR more than 60 mL/min.  Total protein 7.0, albumin 2.6 g/dL.  AST was 46, ALT was 78 and alkaline phosphatase was 116 units/L.  Total bilirubin was normal.  Imaging: Portable 1 view chest radiograph showed low lung volumes without evidence of acute cardiopulmonary disease.  There was aortic atherosclerosis.  Please see images and full radiology report for further detail.  Review of Systems: As per HPI otherwise all other systems reviewed and are negative.  Past Medical History:  Diagnosis Date   GERD (gastroesophageal reflux disease)    Hyperlipidemia    Joint pain    Kidney stone    Past Surgical History:  Procedure Laterality Date   LITHOTRIPSY     Social History  reports that he has never smoked. He has never used smokeless tobacco. He reports that he does not drink alcohol. No history on file for drug use.  No Known Allergies  Family History  Problem Relation Age of Onset   Hypertension Mother    Prior to Admission medications   Medication Sig Start Date End Date Taking? Authorizing Provider  aspirin 81 MG chewable tablet Chew 81 mg by mouth daily.    [provider]  calcium carbonate (TUMS - DOSED IN MG ELEMENTAL CALCIUM) 500 MG chewable tablet Chew 500 mg by mouth daily.    [provider]  cholecalciferol (VITAMIN D) 1000 units tablet Take 1,000 Units by mouth daily.    [provider]  Chromium  200 MCG CAPS Take 200 mcg by mouth 2 (two) times daily.    [provider]  Cinnamon 500 MG capsule Take 500 mg by mouth 2 (two) times daily.    [provider]  co-enzyme Q-10 30 MG capsule Take 250 mg by mouth daily.    [provider]  esomeprazole (NEXIUM) 20 MG capsule Take 20 mg by mouth daily at 12 noon.    [provider]  magnesium gluconate (MAGONATE) 500 MG  tablet Take 500 mg by mouth daily.    [provider]  OVER THE COUNTER MEDICATION Take 1 capsule by mouth 2 (two) times daily. Rufina Falco    [provider]  simvastatin (ZOCOR) 40 MG tablet Take 40 mg by mouth at bedtime.  11/12/16   [provider]  triamcinolone (NASACORT ALLERGY 24HR) 55 MCG/ACT AERO nasal inhaler Place 2 sprays into the nose at bedtime.    [provider]  Turmeric 500 MG CAPS Take 500 mg by mouth daily.    [provider]   Physical Exam: Vitals:   09/27/21 0745 09/27/21 0800 09/27/21 0815 09/27/21 0830  BP: 127/85 132/81 120/80 119/85  Pulse: (!) 111 (!) 110 (!) 106 (!) 105  Resp: (!) 27 (!) 24 20 (!) 25  Temp:      TempSrc:      SpO2: 100% 100% 100% 99%  Weight:      Height:       Constitutional: Chronically ill-appearing.  NAD, calm, comfortable Eyes: PERRL, lids and conjunctivae normal ENMT: Mucous membranes are moist. Posterior pharynx clear of any exudate or lesions. Neck: normal, supple, no masses, no thyromegaly Respiratory: Mildly dyspneic, disc creased breath sounds in the bases, but no crackles, rhonchi or wheezing.  No accessory muscle use.  Cardiovascular: Sinus tachycardia in the low 100s, no murmurs / rubs / gallops. No extremity edema. 2+ pedal pulses. No carotid bruits.  Abdomen: No distention.  PEG in place.  Bowel sounds positive.  Soft, no tenderness, no masses palpated. No hepatosplenomegaly. Musculoskeletal: Severe generalized weakness.  No clubbing / cyanosis.  Good ROM, no contractures. Normal muscle tone.  Skin: Stage I sacral pressure ulcer. Neurologic: CN 2-12 grossly intact. Sensation intact, DTR normal. Strength 3/5 left-sided hemiparesis. Psychiatric: Alert and oriented x 2, oriented to situation partially oriented to time/date..   Labs on Admission: I have personally reviewed following labs and imaging studies  CBC: Recent Labs  Lab 09/27/21 0648  WBC 15.3*  NEUTROABS 12.1*  HGB 13.6   HCT 42.6  MCV 90.6  PLT 123456   Basic Metabolic Panel: Recent Labs  Lab 09/27/21 0648  NA 137  K 4.4  CL 105  CO2 23  GLUCOSE 161*  BUN 27*  CREATININE 0.91  CALCIUM 8.7*    GFR: Estimated Creatinine Clearance: 85.3 mL/min (by C-G formula based on SCr of 0.91 mg/dL).  Liver Function Tests: Recent Labs  Lab 09/27/21 0648  AST 46*  ALT 78*  ALKPHOS 160*  BILITOT 0.9  PROT 7.0  ALBUMIN 2.6*   Urine analysis:    Component Value Date/Time   COLORURINE AMBER (A) 09/27/2021 0648   APPEARANCEUR CLOUDY (A) 09/27/2021 0648   LABSPEC 1.026 09/27/2021 0648   PHURINE 5.0 09/27/2021 0648   GLUCOSEU NEGATIVE 09/27/2021 0648   HGBUR LARGE (A) 09/27/2021 0648   BILIRUBINUR NEGATIVE 09/27/2021 0648   KETONESUR NEGATIVE 09/27/2021 0648   PROTEINUR 100 (A) 09/27/2021 0648   UROBILINOGEN 1.0 07/17/2013 2249   NITRITE NEGATIVE 09/27/2021  [id number]   LEUKOCYTESUR MODERATE (A) 09/27/2021 0648   Radiological Exams on Admission: DG Chest Port 1 View  Result Date: 09/27/2021 CLINICAL DATA:  70 year old male with possible sepsis. EXAM: PORTABLE CHEST 1 VIEW COMPARISON:  Chest x-ray 09/18/2021. FINDINGS: Lung volumes are low. No consolidative airspace disease. No pleural effusions. No pneumothorax. No pulmonary nodule or mass noted. Pulmonary vasculature and the cardiomediastinal silhouette are within normal limits. Atherosclerosis in the thoracic aorta. IMPRESSION: 1. Low lung volumes without radiographic evidence of acute cardiopulmonary disease. 2. Aortic atherosclerosis. Electronically Signed   By: Vinnie Langton M.D.   On: 09/27/2021 07:14    EKG: Independently reviewed.  Vent. rate 119 BPM PR interval 160 ms QRS duration 117 ms QT/QTcB 311/438 ms P-R-T axes 61 37 13 Sinus tachycardia Right bundle branch block Low voltage, precordial leads  Assessment/Plan Principal Problem:   Sepsis due to undetermined organism ([place]) Observation/PCU. Continue IV fluids. Continue Cefepime per  Pharmacy. Foley catheter was changed.   Follow urine culture and sensitivity. Follow blood culture and sensitivity.  Active Problems:   Lactic acidosis Trending down. Continue IV fluids per Continue IV antibiotics. Repeat level if no clinical improvement.    Stage I pressure ulcer POA Continue local care and preventive measures.    GERD (gastroesophageal reflux disease) Continue PPI.    Type 2 diabetes mellitus with hyperglycemia (HCC) Currently on tube feedings. Consult nutritional services. CBG monitoring every 6 hours with RI SS.    Hyperlipidemia/transaminitis Follow-up LFTs. Hold statin.    Moderate protein malnutrition ([place]) Consult nutritional services.   DVT prophylaxis: SCDs. Code Status:   Full code. Family Communication:  His wife was present in the ED room and provided HPI. Disposition Plan:   Patient is from:  SNF.  Anticipated DC to:  SNF.  Anticipated DC date:  09/29/2021 or 09/30/2021.  Anticipated DC barriers: Clinical status.  Consults called:   Admission status:  Observation/progressive care unit.   Severity of Illness:High severity after presenting with fever, tachypnea and tachycardia in the setting of sepsis due to undetermined organism.  The patient will remain in the hospital for IV antibiotic therapy for 2 to 3 days.  Reubin Milan MD [facility]  How to contact the [facility] Attending or Consulting provider [place] or covering provider during after hours [place], for this patient?   Check the care team in [facility] and look for a) attending/consulting [facility] provider listed and b) the [facility] team listed Log into [email or web address] and use 's universal password to access. If you do not have the password, please contact the hospital operator. Locate the [facility] provider you are looking for under [facility] and page to a number that you can be directly reached. If you still have difficulty reaching the provider, please page the [facility]  (Director on Call) for the Hospitalists listed on amion for assistance.  09/27/2021, 8:57 AM   This document was prepared using Dragon voice recognition software and may contain some unintended transcription errors.

## 2021-09-27 NOTE — ED Notes (Signed)
Pt to room 1239, nursing at bedside.

## 2021-09-27 NOTE — ED Notes (Signed)
Assumed care of pt at 0700 pt resting pts wife at bedside.

## 2021-09-27 NOTE — Sepsis Progress Note (Signed)
Notified bedside nurse of need to draw repeat lactic acid after fluid bolus. 

## 2021-09-27 NOTE — ED Triage Notes (Signed)
Pt is able to talk and knows where he is, said that he doesn't feel well. Lung sounds are diminished on the lower lobes

## 2021-09-27 NOTE — ED Notes (Signed)
Oral care performed.

## 2021-09-27 NOTE — ED Notes (Signed)
Repeat lactic sent 

## 2021-09-27 NOTE — ED Provider Notes (Addendum)
[facility]-EMERGENCY DEPT Provider Note   CSN: [id number] Arrival date & time: 09/27/21  0548     History Chief Complaint  Patient presents with   Aspiration    Pt was feed last night by peg tube at 2300, found this am laying flat, vomiting, now is tachypnic and has a high heart rate    Maycen Degregory is a 70 y.o. male.  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  He was brought to the emergency department today for fever and hypoxia.  Patient's oxygen level was reportedly in the 80s.  It was reported he received tube feed last night at around 11 PM, was lying flat overnight and was found covered with vomit this morning.  Fever of 101.2 F on arrival with elevated heart rate, respiratory rate.  Normal oxygen saturation on 10 L by mask.  Onset of symptoms acute.  Course is constant.         Past Medical History:  Diagnosis Date   Diabetes mellitus without complication (HCC)    GERD (gastroesophageal reflux disease)    High cholesterol    Joint pain    Kidney stone     There are no problems to display for this patient.   Past Surgical History:  Procedure Laterality Date   LITHOTRIPSY         Family History  Problem Relation Age of Onset   Hypertension Mother     Social History   Tobacco Use   Smoking status: Never   Smokeless tobacco: Never  Substance Use Topics   Alcohol use: No    Home Medications Prior to Admission medications   Medication Sig Start Date End Date Taking? Authorizing Provider  aspirin 81 MG chewable tablet Chew 81 mg by mouth daily.    [provider]  calcium carbonate (TUMS - DOSED IN  MG ELEMENTAL CALCIUM) 500 MG chewable tablet Chew 1 tablet by mouth daily.    [provider]  cholecalciferol (VITAMIN D) 1000 units tablet Take 1,000 Units by mouth daily.    [provider]  Chromium 200 MCG CAPS Take 200 mcg by mouth 2 (two) times daily.    [provider]  Cinnamon 500 MG capsule Take 500 mg by mouth 2 (two) times daily.    [provider]  co-enzyme Q-10 30 MG capsule Take 250 mg by mouth daily.    [provider]  esomeprazole (NEXIUM) 20 MG capsule Take 20 mg by mouth daily at 12 noon.    [provider]  HYDROcodone-acetaminophen (NORCO/VICODIN) 5-325 MG per tablet Take 1 tablet by mouth every 8 (eight) hours as needed for pain. Patient not taking: Reported on 12/14/2016 07/17/13   Gerhard Munch, MD  magnesium gluconate (MAGONATE) 500 MG tablet Take 500 mg by mouth daily.    [provider]  metoCLOPramide (REGLAN) 10 MG tablet Take 1 tablet (10 mg total) by mouth every 6 (six) hours as needed (nausea). Patient not taking: Reported on 12/14/2016 07/17/13   Gerhard Munch, MD  OVER THE COUNTER MEDICATION Take 1 capsule by mouth 2 (two) times daily. Alean Rinne    [provider]  simvastatin (ZOCOR) 40 MG tablet Take 40 mg by mouth at bedtime.  11/12/16   [provider]  tamsulosin (FLOMAX) 0.4 MG CAPS capsule Take 1 capsule (0.4 mg total) by mouth daily. Patient not taking: Reported on 12/14/2016 07/17/13   Carmin Muskrat, MD  triamcinolone (NASACORT ALLERGY 24HR) 55 MCG/ACT AERO nasal inhaler Place 2 sprays into the nose at bedtime.    [provider]  Turmeric 500 MG CAPS Take 500 mg by mouth daily.    [provider]    Allergies    Patient has no known allergies.  Review of Systems   Review of Systems  Constitutional:  Positive for chills, fatigue and fever.  HENT:  Negative for rhinorrhea and sore throat.   Eyes:  Negative for redness.  Respiratory:  Positive for  shortness of breath. Negative for cough.   Cardiovascular:  Negative for chest pain.  Gastrointestinal:  Positive for nausea and vomiting. Negative for abdominal pain and diarrhea.  Genitourinary:  Negative for dysuria and hematuria.  Musculoskeletal:  Negative for myalgias.  Skin:  Negative for rash.  Neurological:  Positive for weakness. Negative for headaches.   Physical Exam Updated Vital Signs BP 118/77 (BP Location: Right Arm)   Pulse (!) 120   Temp (!) 101.2 F (38.4 C) (Rectal)   Resp (!) 30   Ht 6' (1.829 m)   Wt 79.4 kg   SpO2 99%   BMI 23.73 kg/m   Physical Exam Vitals and nursing note reviewed.  Constitutional:      General: He is not in acute distress.    Appearance: He is well-developed.  HENT:     Head: Normocephalic and atraumatic.     Nose: Nose normal. No congestion or rhinorrhea.     Mouth/Throat:     Mouth: Mucous membranes are moist.  Eyes:     General:        Right eye: No discharge.        Left eye: No discharge.     Conjunctiva/sclera: Conjunctivae normal.  Cardiovascular:     Rate and Rhythm: Regular rhythm. Tachycardia present.     Heart sounds: Normal heart sounds.  Pulmonary:     Effort: Pulmonary effort is normal. Tachypnea (mild) present. No accessory muscle usage or respiratory distress.     Breath sounds: Examination of the right-lower field reveals decreased breath sounds. Examination of the left-lower field reveals decreased breath sounds. Decreased breath sounds present.  Abdominal:     Palpations: Abdomen is soft.     Tenderness: There is no abdominal tenderness.  Musculoskeletal:     Cervical back: Normal range of motion and neck supple.  Skin:    General: Skin is warm and dry.     Comments: There is a small area of skin breakdown just superior to the gluteal cleft.  This is superficial.  It is about the size of a dime.  There was some stool contaminating this area.  Over the sacrum, skin is intact and dry.  Neurological:      Mental Status: He is alert.    ED Results / Procedures / Treatments   Labs (all labs ordered are listed, but only abnormal results are displayed) Labs Reviewed  LACTIC ACID, PLASMA - Abnormal; Notable for the following components:      Result Value   Lactic Acid, Venous 3.0 (*)    All other components within normal limits  COMPREHENSIVE METABOLIC PANEL - Abnormal; Notable for the following components:   Glucose, Bld 161 (*)    BUN 27 (*)    Calcium 8.7 (*)    Albumin  2.6 (*)    AST 46 (*)    ALT 78 (*)    Alkaline Phosphatase 160 (*)    All other components within normal limits  CBC WITH DIFFERENTIAL/PLATELET - Abnormal; Notable for the following components:   WBC 15.3 (*)    Neutro Abs 12.1 (*)    Monocytes Absolute 1.2 (*)    Abs Immature Granulocytes 0.16 (*)    All other components within normal limits  URINALYSIS, ROUTINE W REFLEX MICROSCOPIC - Abnormal; Notable for the following components:   Color, Urine AMBER (*)    APPearance CLOUDY (*)    Hgb urine dipstick LARGE (*)    Protein, ur 100 (*)    Leukocytes,Ua MODERATE (*)    RBC / HPF >50 (*)    WBC, UA >50 (*)    Bacteria, UA MANY (*)    All other components within normal limits  RESP PANEL BY RT-PCR (FLU A&B, COVID) ARPGX2  CULTURE, BLOOD (ROUTINE X 2)  CULTURE, BLOOD (ROUTINE X 2)  URINE CULTURE  PROTIME-INR  APTT  LACTIC ACID, PLASMA    EKG EKG Interpretation  Date/Time:  Wednesday September 27 2021 06:06:43 EDT Ventricular Rate:  119 PR Interval:  160 QRS Duration: 117 QT Interval:  311 QTC Calculation: 438 R Axis:   37 Text Interpretation: Sinus tachycardia Right bundle branch block Low voltage, precordial leads Confirmed by Ripley Fraise [id number]) on 09/27/2021 7:27:37 AM  Radiology DG Chest Port 1 View  Result Date: 09/27/2021 CLINICAL DATA:  70 year old male with possible sepsis. EXAM: PORTABLE CHEST 1 VIEW COMPARISON:  Chest x-ray 09/18/2021. FINDINGS: Lung volumes are low. No consolidative  airspace disease. No pleural effusions. No pneumothorax. No pulmonary nodule or mass noted. Pulmonary vasculature and the cardiomediastinal silhouette are within normal limits. Atherosclerosis in the thoracic aorta. IMPRESSION: 1. Low lung volumes without radiographic evidence of acute cardiopulmonary disease. 2. Aortic atherosclerosis. Electronically Signed   By: Vinnie Langton M.D.   On: 09/27/2021 07:14    Procedures Procedures   Medications Ordered in ED Medications  lactated ringers infusion ( Intravenous New Bag/Given 09/27/21 0756)  ceFEPIme (MAXIPIME) 2 g in sodium chloride 0.9 % 100 mL IVPB (has no administration in time range)  lactated ringers bolus 1,500 mL (has no administration in time range)  acetaminophen (TYLENOL) suppository 650 mg (650 mg Rectal Given 09/27/21 0659)  lactated ringers bolus 1,000 mL (0 mLs Intravenous Stopped 09/27/21 0759)    ED Course  I have reviewed the triage vital signs and the nursing notes.  Pertinent labs & imaging results that were available during my care of the patient were reviewed by me and considered in my medical decision making (see chart for details).  Patient seen and examined. Work-up initiated. Medications ordered.   Vital signs reviewed and are as follows: BP 118/77 (BP Location: Right Arm)   Pulse (!) 120   Temp (!) 101.2 F (38.4 C) (Rectal)   Resp (!) 30   Ht 6' (1.829 m)   Wt 79.4 kg   SpO2 99%   BMI 23.73 kg/m   Lactic acid 3.0. Urine appears infected.   Discussed with Dr. Doren Custard.  He ordered cefepime due to apparent urine infection.  8:41 AM patient and wife Izora Gala updated on results and plan.  Patient is full code.  I discussed the case with Dr. Olevia Bowens.  Plan to admit to stepdown bed.  Requested that Foley catheter be changed.  On reassessment, patient appears stable.  Sepsis - Repeat Assessment  Performed at:    [place]     Blood pressure 119/85, pulse (!) 105, temperature (!) 101.2 F (38.4 C),  temperature source Rectal, resp. rate (!) 25, height 6' (1.829 m), weight 79.4 kg, SpO2 99 %.  Heart:     Regular rate and rhythm  Lungs:    CTA  Capillary Refill:   <2 sec  Peripheral Pulse:   Radial pulse palpable  Skin:     Normal Color   CRITICAL CARE Performed by: Carlisle Cater PA-C Total critical care time: 45 minutes Critical care time was exclusive of separately billable procedures and treating other patients. Critical care was necessary to treat or prevent imminent or life-threatening deterioration. Critical care was time spent personally by me on the following activities: development of treatment plan with patient and/or surrogate as well as nursing, discussions with consultants, evaluation of patient's response to treatment, examination of patient, obtaining history from patient or surrogate, ordering and performing treatments and interventions, ordering and review of laboratory studies, ordering and review of radiographic studies, pulse oximetry and re-evaluation of patient's condition.     MDM Rules/Calculators/A&P                           Patient with sepsis, possible pulmonary or urinary source.  Treatment as above.  Admission to hospital.    Final Clinical Impression(s) / ED Diagnoses Final diagnoses:  Sepsis with acute hypoxic respiratory failure without septic shock, due to unspecified organism [facility])  Acute cystitis without hematuria  Hypoxia    Rx / DC Orders ED Discharge Orders     None        Carlisle Cater, PA-C 09/27/21 0846    Carlisle Cater, PA-C 09/27/21 [id number]    Godfrey Pick, MD 09/27/21 1849

## 2021-09-27 NOTE — Progress Notes (Signed)
A consult was received from an ED physician for cefepime per pharmacy dosing.  The patient's profile has been reviewed for ht/wt/allergies/indication/available labs.   A one time order has been placed by EDP for cefepime 2 g.  Further antibiotics/pharmacy consults should be ordered by admitting physician if indicated.                       Thank you, Valentina Gu 09/27/2021  8:02 AM

## 2021-09-27 NOTE — Progress Notes (Signed)
Sepsis tracking by eLINK 

## 2021-09-27 NOTE — Sepsis Progress Note (Signed)
Notified bedside nurse of need to draw a third lactic acid after completion of the 1.5 L bolus.

## 2021-09-28 DIAGNOSIS — E785 Hyperlipidemia, unspecified: Secondary | ICD-10-CM

## 2021-09-28 DIAGNOSIS — R6520 Severe sepsis without septic shock: Secondary | ICD-10-CM | POA: Diagnosis present

## 2021-09-28 DIAGNOSIS — E876 Hypokalemia: Secondary | ICD-10-CM | POA: Diagnosis not present

## 2021-09-28 DIAGNOSIS — T17908D Unspecified foreign body in respiratory tract, part unspecified causing other injury, subsequent encounter: Secondary | ICD-10-CM | POA: Diagnosis not present

## 2021-09-28 DIAGNOSIS — G479 Sleep disorder, unspecified: Secondary | ICD-10-CM | POA: Diagnosis not present

## 2021-09-28 DIAGNOSIS — K219 Gastro-esophageal reflux disease without esophagitis: Secondary | ICD-10-CM | POA: Diagnosis present

## 2021-09-28 DIAGNOSIS — N390 Urinary tract infection, site not specified: Secondary | ICD-10-CM

## 2021-09-28 DIAGNOSIS — R609 Edema, unspecified: Secondary | ICD-10-CM | POA: Diagnosis not present

## 2021-09-28 DIAGNOSIS — A4159 Other Gram-negative sepsis: Secondary | ICD-10-CM | POA: Diagnosis present

## 2021-09-28 DIAGNOSIS — R5381 Other malaise: Secondary | ICD-10-CM | POA: Diagnosis not present

## 2021-09-28 DIAGNOSIS — L8990 Pressure ulcer of unspecified site, unspecified stage: Secondary | ICD-10-CM | POA: Insufficient documentation

## 2021-09-28 DIAGNOSIS — A419 Sepsis, unspecified organism: Secondary | ICD-10-CM | POA: Diagnosis not present

## 2021-09-28 DIAGNOSIS — R339 Retention of urine, unspecified: Secondary | ICD-10-CM | POA: Diagnosis not present

## 2021-09-28 DIAGNOSIS — L89152 Pressure ulcer of sacral region, stage 2: Secondary | ICD-10-CM | POA: Diagnosis present

## 2021-09-28 DIAGNOSIS — I69391 Dysphagia following cerebral infarction: Secondary | ICD-10-CM | POA: Diagnosis not present

## 2021-09-28 DIAGNOSIS — J9601 Acute respiratory failure with hypoxia: Secondary | ICD-10-CM | POA: Diagnosis present

## 2021-09-28 DIAGNOSIS — E872 Acidosis, unspecified: Secondary | ICD-10-CM

## 2021-09-28 DIAGNOSIS — E43 Unspecified severe protein-calorie malnutrition: Secondary | ICD-10-CM | POA: Diagnosis present

## 2021-09-28 DIAGNOSIS — E871 Hypo-osmolality and hyponatremia: Secondary | ICD-10-CM | POA: Diagnosis not present

## 2021-09-28 DIAGNOSIS — R0902 Hypoxemia: Secondary | ICD-10-CM | POA: Diagnosis present

## 2021-09-28 DIAGNOSIS — I639 Cerebral infarction, unspecified: Secondary | ICD-10-CM | POA: Diagnosis not present

## 2021-09-28 DIAGNOSIS — E8809 Other disorders of plasma-protein metabolism, not elsewhere classified: Secondary | ICD-10-CM | POA: Diagnosis present

## 2021-09-28 DIAGNOSIS — Y846 Urinary catheterization as the cause of abnormal reaction of the patient, or of later complication, without mention of misadventure at the time of the procedure: Secondary | ICD-10-CM | POA: Diagnosis present

## 2021-09-28 DIAGNOSIS — J690 Pneumonitis due to inhalation of food and vomit: Secondary | ICD-10-CM | POA: Diagnosis present

## 2021-09-28 DIAGNOSIS — Z931 Gastrostomy status: Secondary | ICD-10-CM | POA: Diagnosis not present

## 2021-09-28 DIAGNOSIS — E440 Moderate protein-calorie malnutrition: Secondary | ICD-10-CM

## 2021-09-28 DIAGNOSIS — B965 Pseudomonas (aeruginosa) (mallei) (pseudomallei) as the cause of diseases classified elsewhere: Secondary | ICD-10-CM | POA: Diagnosis present

## 2021-09-28 DIAGNOSIS — B370 Candidal stomatitis: Secondary | ICD-10-CM | POA: Diagnosis not present

## 2021-09-28 DIAGNOSIS — D62 Acute posthemorrhagic anemia: Secondary | ICD-10-CM | POA: Diagnosis not present

## 2021-09-28 DIAGNOSIS — E1165 Type 2 diabetes mellitus with hyperglycemia: Secondary | ICD-10-CM

## 2021-09-28 DIAGNOSIS — Z6823 Body mass index (BMI) 23.0-23.9, adult: Secondary | ICD-10-CM | POA: Diagnosis not present

## 2021-09-28 DIAGNOSIS — I700 Atherosclerosis of aorta: Secondary | ICD-10-CM | POA: Diagnosis present

## 2021-09-28 DIAGNOSIS — I69354 Hemiplegia and hemiparesis following cerebral infarction affecting left non-dominant side: Secondary | ICD-10-CM | POA: Diagnosis not present

## 2021-09-28 DIAGNOSIS — T83518A Infection and inflammatory reaction due to other urinary catheter, initial encounter: Secondary | ICD-10-CM | POA: Diagnosis present

## 2021-09-28 DIAGNOSIS — D649 Anemia, unspecified: Secondary | ICD-10-CM | POA: Diagnosis present

## 2021-09-28 DIAGNOSIS — B961 Klebsiella pneumoniae [K. pneumoniae] as the cause of diseases classified elsewhere: Secondary | ICD-10-CM | POA: Diagnosis present

## 2021-09-28 DIAGNOSIS — Z20822 Contact with and (suspected) exposure to covid-19: Secondary | ICD-10-CM | POA: Diagnosis present

## 2021-09-28 DIAGNOSIS — R7401 Elevation of levels of liver transaminase levels: Secondary | ICD-10-CM

## 2021-09-28 DIAGNOSIS — T17908A Unspecified foreign body in respiratory tract, part unspecified causing other injury, initial encounter: Secondary | ICD-10-CM | POA: Diagnosis present

## 2021-09-28 LAB — COMPREHENSIVE METABOLIC PANEL
ALT: 49 U/L — ABNORMAL HIGH (ref 0–44)
AST: 33 U/L (ref 15–41)
Albumin: 2 g/dL — ABNORMAL LOW (ref 3.5–5.0)
Alkaline Phosphatase: 113 U/L (ref 38–126)
Anion gap: 6 (ref 5–15)
BUN: 20 mg/dL (ref 8–23)
CO2: 24 mmol/L (ref 22–32)
Calcium: 8.3 mg/dL — ABNORMAL LOW (ref 8.9–10.3)
Chloride: 107 mmol/L (ref 98–111)
Creatinine, Ser: 0.6 mg/dL — ABNORMAL LOW (ref 0.61–1.24)
GFR, Estimated: >60 mL/min (ref >60)
Glucose, Bld: 83 mg/dL (ref 70–99)
Potassium: 4.2 mmol/L (ref 3.5–5.1)
Sodium: 137 mmol/L (ref 135–145)
Total Bilirubin: 0.8 mg/dL (ref 0.3–1.2)
Total Protein: 5.6 g/dL — ABNORMAL LOW (ref 6.5–8.1)

## 2021-09-28 LAB — GLUCOSE, CAPILLARY
Glucose-Capillary: 106 mg/dL — ABNORMAL HIGH (ref 70–99)
Glucose-Capillary: 88 mg/dL (ref 70–99)
Glucose-Capillary: 89 mg/dL (ref 70–99)
Glucose-Capillary: 89 mg/dL (ref 70–99)
Glucose-Capillary: 91 mg/dL (ref 70–99)
Glucose-Capillary: 92 mg/dL (ref 70–99)

## 2021-09-28 LAB — CBC WITH DIFFERENTIAL/PLATELET
Abs Immature Granulocytes: 0.09 10*3/uL — ABNORMAL HIGH (ref 0.00–0.07)
Basophils Absolute: 0 10*3/uL (ref 0.0–0.1)
Basophils Relative: 0 %
Eosinophils Absolute: 0.2 10*3/uL (ref 0.0–0.5)
Eosinophils Relative: 2 %
HCT: 33.8 % — ABNORMAL LOW (ref 39.0–52.0)
Hemoglobin: 10.9 g/dL — ABNORMAL LOW (ref 13.0–17.0)
Immature Granulocytes: 1 %
Lymphocytes Relative: 24 %
Lymphs Abs: 2.3 10*3/uL (ref 0.7–4.0)
MCH: 29.1 pg (ref 26.0–34.0)
MCHC: 32.2 g/dL (ref 30.0–36.0)
MCV: 90.4 fL (ref 80.0–100.0)
Monocytes Absolute: 0.8 10*3/uL (ref 0.1–1.0)
Monocytes Relative: 8 %
Neutro Abs: 6.1 10*3/uL (ref 1.7–7.7)
Neutrophils Relative %: 65 %
Platelets: 171 10*3/uL (ref 150–400)
RBC: 3.74 MIL/uL — ABNORMAL LOW (ref 4.22–5.81)
RDW: 14.1 % (ref 11.5–15.5)
WBC: 9.5 10*3/uL (ref 4.0–10.5)
nRBC: 0 % (ref 0.0–0.2)

## 2021-09-28 LAB — MRSA NEXT GEN BY PCR, NASAL: MRSA by PCR Next Gen: DETECTED — AB

## 2021-09-28 LAB — PHOSPHORUS
Phosphorus: 3.1 mg/dL (ref 2.5–4.6)
Phosphorus: 3.6 mg/dL (ref 2.5–4.6)

## 2021-09-28 LAB — MAGNESIUM
Magnesium: 2.1 mg/dL (ref 1.7–2.4)
Magnesium: 1.9 mg/dL (ref 1.7–2.4)

## 2021-09-28 LAB — HIV ANTIBODY (ROUTINE TESTING W REFLEX): HIV Screen 4th Generation wRfx: NONREACTIVE

## 2021-09-28 MED ORDER — LANSOPRAZOLE 3 MG/ML SUSP: 15.0000 mg | Freq: Every day | ORAL | Status: DC

## 2021-09-28 MED ORDER — GUAIFENESIN-DM 100-10 MG/5ML PO SYRP
5.0000 mL | ORAL_SOLUTION | ORAL | Status: DC | PRN
  Filled 2021-09-28: qty 10

## 2021-09-28 MED ORDER — GUAIFENESIN 100 MG/5ML PO LIQD
5.0000 mL | ORAL | Status: DC | PRN
  Administered 2021-09-28 – 2021-09-29 (×2): 5 mL
  Filled 2021-09-28: qty 10

## 2021-09-28 MED ORDER — JEVITY 1.5 CAL/FIBER PO LIQD
1000.0000 mL | ORAL | Status: DC
  Filled 2021-09-28: qty 1000

## 2021-09-28 MED ORDER — LEVALBUTEROL HCL 0.63 MG/3ML IN NEBU: 0.6300 mg | INHALATION_SOLUTION | Freq: Four times a day (QID) | RESPIRATORY_TRACT | Status: DC | PRN

## 2021-09-28 MED ORDER — MUPIROCIN 2 % EX OINT
1.0000 "application " | TOPICAL_OINTMENT | Freq: Two times a day (BID) | CUTANEOUS | Status: AC
  Administered 2021-09-28 – 2021-10-02 (×8): 1 via NASAL
  Filled 2021-09-28: qty 22

## 2021-09-28 MED ORDER — AMPICILLIN-SULBACTAM SODIUM 3 (2-1) G IJ SOLR
3.0000 g | Freq: Four times a day (QID) | INTRAMUSCULAR | Status: DC
Start: 2021-09-28 — End: 2021-09-29
  Administered 2021-09-28 – 2021-09-29 (×4): 3 g via INTRAVENOUS
  Filled 2021-09-28 (×5): qty 8

## 2021-09-28 MED ORDER — IPRATROPIUM BROMIDE 0.02 % IN SOLN: 0.5000 mg | Freq: Four times a day (QID) | RESPIRATORY_TRACT | Status: DC | PRN

## 2021-09-28 MED ORDER — ASPIRIN 81 MG PO CHEW
81.0000 mg | CHEWABLE_TABLET | Freq: Every morning | ORAL | Status: DC
  Administered 2021-09-29 – 2021-10-10 (×12): 81 mg
  Filled 2021-09-28 (×13): qty 1

## 2021-09-28 MED ORDER — PROSOURCE TF PO LIQD
45.0000 mL | Freq: Two times a day (BID) | ORAL | Status: DC
  Administered 2021-09-28 – 2021-10-08 (×21): 45 mL
  Filled 2021-09-28 (×22): qty 45

## 2021-09-28 MED ORDER — ACETAMINOPHEN 160 MG/5ML PO SOLN
650.0000 mg | Freq: Four times a day (QID) | ORAL | Status: DC | PRN
  Administered 2021-09-28 – 2021-10-09 (×11): 650 mg
  Filled 2021-09-28 (×12): qty 20.3

## 2021-09-28 MED ORDER — GUAIFENESIN 100 MG/5ML PO LIQD
5.0000 mL | ORAL | Status: DC | PRN
  Filled 2021-09-28: qty 10

## 2021-09-28 MED ORDER — PANTOPRAZOLE 2 MG/ML SUSPENSION
40.0000 mg | Freq: Every day | ORAL | Status: DC
  Administered 2021-09-29 – 2021-10-10 (×12): 40 mg
  Filled 2021-09-28 (×13): qty 20

## 2021-09-28 MED ORDER — FREE WATER
150.0000 mL | Status: DC
  Administered 2021-09-28 – 2021-10-10 (×71): 150 mL

## 2021-09-28 MED ORDER — SIMVASTATIN 40 MG PO TABS
40.0000 mg | ORAL_TABLET | Freq: Every evening | ORAL | Status: DC
  Administered 2021-09-28 – 2021-10-09 (×12): 40 mg
  Filled 2021-09-28 (×12): qty 1

## 2021-09-28 MED ORDER — VITAL HIGH PROTEIN PO LIQD: 1000.0000 mL | ORAL | Status: DC

## 2021-09-28 MED ORDER — JEVITY 1.5 CAL/FIBER PO LIQD
948.0000 mL | ORAL | Status: DC
  Administered 2021-09-28 – 2021-09-29 (×2): 948 mL
  Administered 2021-10-01: 712 mL
  Administered 2021-10-02 – 2021-10-04 (×3): 948 mL
  Filled 2021-09-28 (×16): qty 948

## 2021-09-28 MED ORDER — GERHARDT'S BUTT CREAM
TOPICAL_CREAM | Freq: Two times a day (BID) | CUTANEOUS | Status: DC
  Administered 2021-10-01 – 2021-10-08 (×4): 1 via TOPICAL
  Filled 2021-09-28 (×3): qty 1

## 2021-09-28 MED ORDER — INSULIN ASPART 100 UNIT/ML IJ SOLN
0.0000 [IU] | INTRAMUSCULAR | Status: DC
Start: 2021-09-28 — End: 2021-10-10
  Administered 2021-09-29 – 2021-10-03 (×16): 1 [IU] via SUBCUTANEOUS
  Administered 2021-10-03: 2 [IU] via SUBCUTANEOUS
  Administered 2021-10-03 – 2021-10-04 (×3): 1 [IU] via SUBCUTANEOUS
  Administered 2021-10-04: 2 [IU] via SUBCUTANEOUS
  Administered 2021-10-04 – 2021-10-05 (×2): 1 [IU] via SUBCUTANEOUS
  Administered 2021-10-05 (×2): 2 [IU] via SUBCUTANEOUS
  Administered 2021-10-05 – 2021-10-06 (×4): 1 [IU] via SUBCUTANEOUS
  Administered 2021-10-06: 2 [IU] via SUBCUTANEOUS
  Administered 2021-10-06 – 2021-10-09 (×12): 1 [IU] via SUBCUTANEOUS
  Administered 2021-10-09 – 2021-10-10 (×2): 2 [IU] via SUBCUTANEOUS

## 2021-09-28 NOTE — Consult Note (Signed)
WOC Nurse Consult Note: Reason for Consult:Stage 2 pressure injury to sacrum with surrounding moisture associated skin damage along gluteal fold at the apex. Wound type:Pressure and moisture Pressure Injury POA: Yes- was recently hospitalized for over 30 days at neighboring hospitals, wife says this began there.  Measurement: sacrum:  0.3 cm x 0.4 cm with thin fibrin to 25% and 75% pale pink Gluteal fold:  3 cmx 0.5 cm x 0.1 cm  Wound QQV:ZDGL and moist Drainage (amount, consistency, odor) scant weeping Periwound: MASD in gluteal fold Dressing procedure/placement/frequency:Cleanse sacral/gluteal skin breakdown with soap and water and pat dry. Apply gerhardts butt paste twice daily.  Open to air.   Turn and reposition every two hours.  Will not follow at this time.  Please re-consult if needed.  Maple Hudson MSN, RN, FNP-BC CWON Wound, Ostomy, Continence Nurse Pager [phone]

## 2021-09-28 NOTE — Evaluation (Signed)
Clinical/Bedside Swallow Evaluation Patient Details  Name: Raymond Stuart MRN: [id number] Date of Birth: 11-Oct-1951  Today's Date: 09/28/2021 Time: SLP Start Time (ACUTE ONLY): 1054 SLP Stop Time (ACUTE ONLY): 1120 SLP Time Calculation (min) (ACUTE ONLY): 26 min  Past Medical History:  Past Medical History:  Diagnosis Date   GERD (gastroesophageal reflux disease)    Hyperlipidemia    Joint pain    Kidney stone    Past Surgical History:  Past Surgical History:  Procedure Laterality Date   LITHOTRIPSY     HPI:  Raymond Stuart is a 70 y.o. male with medical history significant of GERD hyperlipidemia, osteoarthritis, urolithiasis who was recently admitted to [facility] and then [facility] after having subdural hematoma, traumatic intraparenchymal hemorrhage, ventricular hemorrhage in the setting of acute head injury with an occipital fracture, ischemic stroke, followed by aspiration pneumonia in the setting of stroke dysphagia, PEG placement, transaminitis, AKI, acute urinary retention with indwelling Foley catheter.  Pt was transferred from his facility after being there for less than 24 hours to hospital after being found covered in vomit, febrile at 101.2 F,, tachypneic/dyspneic and hypoxic in the 80s.  Per Md notes, "he responded to [facility] oxygen at 10 LPM then subsequently brought to the emergency department.  At the facility, he was started getting tube feeding around 2300 last night.  He has been bedbound.  A significant portion of the history is taken from his wife, but he was able to say a few things and answer simple questions."  Swallow eval ordered.  Pt has hx of dysphagia diagnosed initially 10/4 and mech soft diet ordered, mentation waxed/waned and pt was made npo with Dobhoff.  MBS then conducted 10/19 and 11/1 - recommendation was npo x frazier water protocol.  Large barrier to adequate po intake has been his mentation.    Rec npo and ice chips with aggressive SLP at next venue of care.  11/3, spouse  advised pt fully alert, much improved the last 24 hours compared to last 40 days!    Assessment / Plan / Recommendation  Clinical Impression  Pt with complex medical history with 2 admits to hospitals prior to one day at SNF before transferred to [facility] with n/v and ? aspiration of tube feeding.  Reviewed in detail prior hx with pt and his wife, Raymond Stuart.  Oral motor exam largely unremarkable except viscous secretions retained in oral cavity.  Pt able to orally manipulate to swish and orally suction with water to facilitate oral clearance.  Baseline cough x3 without po - ? due to secretion aspiration?  Cough and voice is strong despite pt's mild delay in verbal responses.    Few boluse of ice chips and thin water provided to pt -delayed cough after swallow with first ice chip that may indicate airway infiltration of even secretions.  No indication of aspiration and clinically timely swallow with water via tsp and cup. Laryngeal elevation appreciated via observation and palpation.    Pt then informed SLP he needed to have a bowel movement and could not do further boluses due to "pressure" down there.  SLP ceased BSE and planned to conduct MBS.    After approval by MD, RN, xray establishing time, pt/wife approval - when arrived back to room to share testing time, pt was nodding off to sleep.  Pt's waxing/waning mentation have been issue prior to this admit.  His wife states mornings are better for him and that he was awake at 0300 calling her.  Will defer MBS until next date to assure mentation is optimal for evaluation. Although pt just had MBS 09/26/2021, he was only observed to swallow 2 boluses of puree with pharyngeal retention  Today, pt clinically judged to be much improved and likely sensorimotor deficits from TBI/CVA, repeat MBS indicated.     In the interim, when pt fully alert, recommend ice chips and tsps of water - allowing pt to swish and orally suction water.  Educated wife and pt to  recommendations and posted precaution sign - RN, pt, wife in agreement to plan. Thanks for this consult.  SLP Visit Diagnosis: Dysphagia, unspecified (R13.10);Dysphagia, pharyngeal phase (R13.13)    Aspiration Risk  Moderate aspiration risk;Risk for inadequate nutrition/hydration    Diet Recommendation Ice chips PRN after oral care (tsps water after oral care)   Medication Administration: Via alternative means Supervision: Full supervision/cueing for compensatory strategies Postural Changes: Seated upright at 90 degrees    Other  Recommendations Oral Care Recommendations: Oral care QID;Oral care prior to ice chip/H20    Recommendations for follow up therapy are one component of a multi-disciplinary discharge planning process, led by the attending physician.  Recommendations may be updated based on patient status, additional functional criteria and insurance authorization.  Follow up Recommendations Skilled Nursing facility;Inpatient Rehab (vs)      Frequency and Duration min 1 x/week  2 weeks       Prognosis Prognosis for Safe Diet Advancement: Fair (depending on mentation) Barriers to Reach Goals: Other (Comment) (mentation)      Swallow Study   General HPI: Raymond Stuart is a 70 y.o. male with medical history significant of GERD hyperlipidemia, osteoarthritis, urolithiasis who was recently admitted to [facility] and then [facility] after having subdural hematoma, traumatic intraparenchymal hemorrhage, ventricular hemorrhage in the setting of acute head injury with an occipital fracture, ischemic stroke, followed by aspiration pneumonia in the setting of stroke dysphagia, PEG placement, transaminitis, AKI, acute urinary retention with indwelling Foley catheter.  Pt was transferred from his facility after being there for less than 24 hours to hospital after being found covered in vomit, febrile at 101.2 F,, tachypneic/dyspneic and hypoxic in the 80s.  Per Md notes, "he responded to [facility] oxygen at  10 LPM then subsequently brought to the emergency department.  At the facility, he was started getting tube feeding around 2300 last night.  He has been bedbound.  A significant portion of the history is taken from his wife, but he was able to say a few things and answer simple questions."  Swallow eval ordered.  Pt has hx of dysphagia diagnosed initially 10/4 and mech soft diet ordered, mentation waxed/waned and pt was made npo with Dobhoff.  MBS then conducted 10/19 and 11/1 - recommendation was npo x frazier water protocol.  Large barrier to adequate po intake has been his mentation.    Rec npo and ice chips with aggressive SLP at next venue of care.  11/3, spouse advised pt fully alert, much improved the last 24 hours compared to last 40 days! Previous Swallow Assessment: MBS 11/1 Limited MBS 09/26/2021 due to pt only swallowing a few boluses.  Minimal lingual propulsion noted with pt allowing spillage of boluses into pharynx with puree.  Could not form adequate intraoral pressure to siphon from straw with liquids.  All material retained at valleculae with limited hyoid excurtion and laryngeal elevation. Only assess with puree. Diet Prior to this Study: NPO Temperature Spikes Noted: No Respiratory Status: Room air History of Recent  Intubation: No Behavior/Cognition: Alert;Cooperative Oral Cavity Assessment: Dry;Dried secretions;Excessive secretions Oral Care Completed by SLP: Yes Oral Cavity - Dentition: Adequate natural dentition Vision: Functional for self-feeding Self-Feeding Abilities: Able to feed self Patient Positioning: Upright in chair Baseline Vocal Quality: Normal Volitional Cough: Strong (spouse reports pt's cough is at baseline) Volitional Swallow: Able to elicit    Oral/Motor/Sensory Function Overall Oral Motor/Sensory Function: Mild impairment (pt able to seal lips on spoon and suction) Facial ROM: Within Functional Limits Facial Strength: Within Functional Limits Lingual ROM:  Within Functional Limits Lingual Symmetry: Within Functional Limits Lingual Strength: Within Functional Limits Velum: Within Functional Limits   Ice Chips Ice chips: Impaired Presentation: Spoon Pharyngeal Phase Impairments: Cough - Delayed   Thin Liquid Thin Liquid: Within functional limits Presentation: Self Fed;Cup;Spoon    Nectar Thick Nectar Thick Liquid: Not tested   Honey Thick Honey Thick Liquid: Not tested   Puree Puree: Not tested   Solid     Solid: Not tested      Chales Abrahams 09/28/2021,1:49 PM  Rolena Infante, MS [facility] SLP Acute Rehab Services Office [phone] Pager [phone]

## 2021-09-28 NOTE — Progress Notes (Signed)
Initial Nutrition Assessment  DOCUMENTATION CODES:   Severe malnutrition in context of acute illness/injury  INTERVENTION:  - will order Jevity 1.5 @ 30 ml/hr to advance by 10 ml every 8 hours to reach goal rate of 60 ml/hr with 45 ml Prosource TF BID and 150 ml free water every 4 hours. - slow advancement d/t concern for aspiration. - at goal rate, this regimen will provide 2240 kcal, 114 grams protein (95% protein need), and 1993 ml free water.   - will monitor for ability for diet advancement and will adjust TF accordingly dependent on PO intakes.    NUTRITION DIAGNOSIS:   Severe Malnutrition related to acute illness (fall on 9/26 with subsequent hemorrages and strokes) as evidenced by moderate fat depletion, moderate muscle depletion, severe muscle depletion, percent weight loss.  GOAL:   Patient will meet greater than or equal to 90% of their needs  MONITOR:   Diet advancement, TF tolerance, Labs, Weight trends, Skin  REASON FOR ASSESSMENT:   Consult Enteral/tube feeding initiation and management  ASSESSMENT:   70 y.o. male with medical history of GERD, HLD, osteoarthritis, and urolithiasis. He had a fall on 08/21/21 which caused subdural hematoma, traumatic intraparenchymal hemorrhage, ventricular hemorrhage in the setting of acute head injury with an occipital fracture, ischemic stroke, followed by aspiration pneumonia in the setting of stroke dysphagia, PEG placement, transaminitis, AKI, acute urinary retention with indwelling Foley catheter. He presented to the [facility] ED from [facility], where he was for <24 hours, after an episode of vomiting, fever of 101.2 F, tachypneic/dyspneic and hypoxic in the 80s.  He has been NPO since admission. Able to talk with Charge Nurse and SLP prior to visit to patient's room. His wife was at bedside and provides all information.   While he was at [facility], he had PEG placed (RD unsure on date) and was initiated on Jevity 1.5 @ 20 ml/hr and  advanced by 10 ml every 4 hours to reach goal rate of 60 ml/hr. This regimen provides 2160 kcal, 92 grams protein, and 1093 ml free water.   SLP shared that patient had been advanced to a Soft diet previously but that mentation has been waxing and waning. Plan for MBS later today to assess.   Prior to patient's accident on 9/26 he was functioning at baseline with no changes in appetite, intakes, or weight. On 9/30 he weighed 206 lb which was a typical weight for him. He greatly enjoys eating.  Weight yesterday and at [facility] on 10/26 was 175 lb. This indicates 31 lb weight loss (15% body weight) in 5 weeks; significant for time frame.    Labs reviewed; CBGs: 88, 89, 89 mg/dl, creatinine: 0.6 mg/dl, Ca: 8.3 mg/dl. Medications reviewed; sliding scale novolog.    NUTRITION - FOCUSED PHYSICAL EXAM:  Flowsheet Row Most Recent Value  Orbital Region Mild depletion  Upper Arm Region Moderate depletion  Thoracic and Lumbar Region Unable to assess  Buccal Region Moderate depletion  Temple Region Mild depletion  Clavicle Bone Region Moderate depletion  Clavicle and Acromion Bone Region Moderate depletion  Scapular Bone Region Mild depletion  Dorsal Hand Mild depletion  Patellar Region Severe depletion  Anterior Thigh Region Severe depletion  Posterior Calf Region Severe depletion  Edema (RD Assessment) Mild  [BLE]  Hair Reviewed  Eyes Reviewed  Mouth Reviewed  Skin Reviewed  Nails Reviewed       Diet Order:   Diet Order  Diet NPO time specified  Diet effective now                   EDUCATION NEEDS:   No education needs have been identified at this time  Skin:  Skin Assessment: Skin Integrity Issues: Skin Integrity Issues:: Stage II, DTI DTI: L buttocks Stage II: coccyx  Last BM:  PTA/unknown  Height:   Ht Readings from Last 1 Encounters:  09/27/21 6' (1.829 m)    Weight:   Wt Readings from Last 1 Encounters:  09/27/21 79.4 kg     Estimated  Nutritional Needs:  Kcal:  2225-2540 kcal Protein:  120-135 grams Fluid:  >/= 2.4 L/day     Trenton Gammon, MS, RD, LDN, CNSC Inpatient Clinical Dietitian RD pager # available in [facility]  After hours/weekend pager # available in [facility]

## 2021-09-28 NOTE — Progress Notes (Addendum)
PROGRESS NOTE    Raymond Stuart  FYB:[id number] DOB: [birth year] DOA: 09/27/2021 PCP: Glendon Axe, MD  Brief Narrative:  The patient is a 70 year old Caucasian male with a past medical history significant for but not limited to GERD, hyperlipidemia, osteoarthritis, urolithiasis as well as other comorbidities who was recently admitted to [facility] and then [facility] after having a subdural hematoma, traumatic intraparenchymal hemorrhage, ventricular hemorrhage in the setting of acute head injury with occipital fracture, ischemic stroke followed by aspiration pneumonia in setting of stroke dysphagia status post PEG placement, transaminitis, AKI, acute urinary tension with indwelling Foley catheter was then transferred from his rehab facility yesterday morning after being found covered in vomit and febrile with 101.2 as well as being dyspneic and hypoxic in the 80s.  He responded to nonrebreather oxygen at 10L/min and then subsequently was brought to the ED.  At the facility he was started getting tube feedings around 11 PM and he has been bedbound since his stroke.  In the ED he is noted to be septic with a temperature of 101.2 and a pulse of 120 and respirations of 25.  He had a new O2 requirement and was placed on 3 L supplemental oxygen via nasal cannula.  His urinalysis was concerning for urinary tract infection and he had microscopic blood with 50 RBCs and 50 WBCs and many bacteria.  WBC was elevated at 15.3 and lactic acid was elevated.  He was also found to have some mildly abnormal LFTs.  Chest x-ray was done and showed low lung volumes without any evidence of acute cardiopulmonary disease.  He was admitted for sepsis secondary to likely aspiration pneumonia with concomitant UTI and he is improving.  Assessment & Plan:   Principal Problem:   Sepsis due to undetermined organism [facility]) Active Problems:   Lactic acidosis   GERD (gastroesophageal reflux disease)   Type 2 diabetes  mellitus with hyperglycemia (HCC)   Hyperlipidemia   Transaminitis   Moderate protein malnutrition (HCC)   Pressure injury of skin  Severe sepsis secondary to likely aspiration pneumonia with concomitant catheter associated UTI, present on admission -He was placed in the stepdown unit -Met sepsis criteria on admission as he is febrile, tachypneic, tachycardic, had a leukocytosis as well as a source of infection as well as a new O2 requirement -He was given IV fluid hydration with LR at 150 MLS per hour which is now stopped after 20 hours -Lactic acid was elevated at 3.1 and then trended down to 1.8 with fluid resuscitation -Patient has a chronic Foley since his last admission and he was unable to have it discontinued at the last hospitalization; he presented with a Foley and so this was changed yesterday but did show signs of an infection -WBC has trended down from 15.3 is now normalized at 9.5 -Blood cultures x2 show no growth to date less than 24 hours -He had a urinalysis done which showed a cloudy appearance with amber color urine, large hemoglobin, moderate leukocytes, many bacteria, greater than 50 RBCs per high-power field, greater than 50 WBCs and a urine culture that shows greater than 100,000 colony-forming unit of gram-negative rods -We will obtain SLP evaluation they are planning on doing a modified barium swallow in the morning -Continue to follow cultures and continue with empiric antibiotics but will change given concern for aspiration to IV Unasyn -Added guaifenesin 5 mL every 4 hours to loosen cough and some -Continue to monitor temperature curve and WBC and follow-up on the  cultures  Acute respiratory failure with hypoxia in the setting of aspiration from his vomit -Noted to have an O2 saturation in the 80s on arrival to the ED had to be placed on a nonrebreather -SpO2: 95 % O2 Flow Rate (L/min): 1 L/min -Now was weaned off of supplemental oxygen via nasal  cannula -Continuous pulse oximetry maintain O2 saturation greater than 90% -Continue supplemental oxygen via nasal cath warranted and wean to room air as able -We will repeat the chest x-ray in the a.m. -We will add Xopenex/Atrovent every 6 as needed for wheezing and shortness of breath  Pressure ulcer -Present on admission -Continue with local and preventive measures and will consult wound care for further evaluation -Continue with Gerhardt's Butt cream topically 2 times daily  GERD/GI prophylaxis -Continue with PPI with Lansoprazole 15 mg per tube  Diabetes mellitus type 2 with hyperglycemia -Continue with sensitive NovoLog sliding scale insulin every 4h -Continue to monitor blood sugars carefully; CBG's have been ranging from 88-104  Dysphagia in the setting of CVA -Continue tube feedings with Jevity at 60 mL/h continuous along with Prosource plus tube feedings 45 mL per tube twice daily and 150 mL free water flushes every 4h -Have consulted SLP for further evaluation and they are planning on MBS in the morning  Normocytic Anemia -Patient's hemoglobin/hematocrit went from 13.6/42.6 and trended down to 10.9/33.8 -Likely dilutional drop in the setting of IV fluid resuscitation -Check anemia panel in the a.m. -Continue to monitor for signs and symptoms of bleeding; currently no overt bleeding noted -Repeat CBC in a.m.  Hyperlipidemia  -Holding statin in the setting of his transaminitis and abnormal LFTs  Abnormal LFTs -Improving; likely in the setting of sepsis -AST went from 46 and trended down to 33 -ALT went from 78 and trended down to 49 -Continue to monitor and trend hepatic function carefully and not improving worsening will consider a right upper quadrant ultrasound as well as an acute hepatitis panel -Repeat CMP in a.m.  Generalized weakness and deconditioning in the setting of his acute CVA and prolonged hospitalization -Has some residual left hemiplegia -We will  consult PT and OT to further evaluate and treat  Severe malnutrition in the context of acute illness/injury -Nutritionist has been consulted for further evaluation recommendations -Have ordered the patient Jevity 1.5 x 30 mL/h to advance by 10 mils per hour every 8 hours to reach a goal rate of 60 mils per hour with 45 mL of Prosource TF twice daily and 150 mL free water flushes every 4 hours -They are recommending slow advancement due to concern for aspiration -SLP consulted for MBS  DVT prophylaxis: SCDs; will consider pharmacological prophylaxis in the morning with enoxaparin Code Status: FULL CODE Family Communication: Discussed with wife at bedside  Disposition Plan: Pending further clinical improvement and evaluation by PT and OT as well as SLP  Status is: Observation  The patient will require care spanning > 2 midnights and should be moved to inpatient because: Patient was meeting sepsis criteria and is improving however still not back to his baseline.  SLP is evaluating and planning for an MBS in the morning and PT OT will still need to see the patient for further evaluation.   Consultants:  None  Procedures: None  Antimicrobials:  Anti-infectives (From admission, onward)    Start     Dose/Rate Route Frequency Ordered Stop   09/27/21 1700  ceFEPIme (MAXIPIME) 2 g in sodium chloride 0.9 % 100 mL IVPB  2 g 200 mL/hr over 30 Minutes Intravenous Every 8 hours 09/27/21 0848     09/27/21 0815  ceFEPIme (MAXIPIME) 2 g in sodium chloride 0.9 % 100 mL IVPB        2 g 200 mL/hr over 30 Minutes Intravenous  Once 09/27/21 0800 09/27/21 0943   09/27/21 0645  cefTRIAXone (ROCEPHIN) 2 g in sodium chloride 0.9 % 100 mL IVPB  Status:  Discontinued        2 g 200 mL/hr over 30 Minutes Intravenous Every 24 hours 09/27/21 0642 09/27/21 0800   09/27/21 0645  azithromycin (ZITHROMAX) 500 mg in sodium chloride 0.9 % 250 mL IVPB  Status:  Discontinued        500 mg 250 mL/hr over 60  Minutes Intravenous Every 24 hours 09/27/21 2992 09/27/21 0801        Subjective: Seen and examined at bedside and he is awake and interactive.  Denied any chest pain or shortness of breath.  No nausea or vomiting.  Will be getting MBS in the morning.  His mentation is much better per nursing.  No other concerns or plans this time..  Objective: Vitals:   09/28/21 0200 09/28/21 0300 09/28/21 0400 09/28/21 0500  BP: (!) 117/56 113/86 (!) 127/55 (!) 111/55  Pulse: 88 73 84 81  Resp: 17 20 20 17   Temp:   (!) 97.5 F (36.4 C)   TempSrc:   Oral   SpO2: 97% 98% 97% 97%  Weight:      Height:        Intake/Output Summary (Last 24 hours) at 09/28/2021 0815 Last data filed at 09/28/2021 0700 Gross per 24 hour  Intake 5129.24 ml  Output 1750 ml  Net 3379.24 ml   Filed Weights   09/27/21 0601  Weight: 79.4 kg   Examination: Physical Exam:  Constitutional: WN/WD chronically ill-appearing Caucasian male currently in no acute distress appears calm  Eyes: Lids and conjunctivae normal, sclerae anicteric  ENMT: External Ears, Nose appear normal. Grossly normal hearing. Neck: Appears normal, supple, no cervical masses, normal ROM, no appreciable thyromegaly; no appreciable JVD Respiratory: Diminished to auscultation bilaterally with coarse breath sounds and some slight rhonchi.  No appreciable wheezing, rales, or crackles. Normal respiratory effort and patient is not tachypenic. No accessory muscle use.  Unlabored breathing and not wearing supplemental oxygen via nasal cannula Cardiovascular: RRR, no murmurs / rubs / gallops. S1 and S2 auscultated.  Very trace extremity edema Abdomen: Soft, non-tender, non-distended.  PEG tube is in place but is high up in the mid abdomen. bowel sounds positive.  GU: Deferred.  Foley catheter is in place Musculoskeletal: No clubbing / cyanosis of digits/nails. No joint deformity upper and lower extremities.  Skin: Has a pressure ulcer noted. no induration;  Warm and dry.  Neurologic: CN 2-12 grossly intact with no focal deficits.  Does have some left-sided weakness and mild hemiplegia Psychiatric: Normal judgment and insight. Alert and oriented x 3. Normal mood and appropriate affect.   Data Reviewed: I have personally reviewed following labs and imaging studies  CBC: Recent Labs  Lab 09/27/21 0648 09/28/21 0305  WBC 15.3* 9.5  NEUTROABS 12.1* 6.1  HGB 13.6 10.9*  HCT 42.6 33.8*  MCV 90.6 90.4  PLT 280 426   Basic Metabolic Panel: Recent Labs  Lab 09/27/21 0648 09/28/21 0305  NA 137 137  K 4.4 4.2  CL 105 107  CO2 23 24  GLUCOSE 161* 83  BUN 27* 20  CREATININE 0.91  0.60*  CALCIUM 8.7* 8.3*   GFR: Estimated Creatinine Clearance: 97 mL/min (A) (by C-G formula based on SCr of 0.6 mg/dL (L)). Liver Function Tests: Recent Labs  Lab 09/27/21 0648 09/28/21 0305  AST 46* 33  ALT 78* 49*  ALKPHOS 160* 113  BILITOT 0.9 0.8  PROT 7.0 5.6*  ALBUMIN 2.6* 2.0*   No results for input(s): LIPASE, AMYLASE in the last 168 hours. No results for input(s): AMMONIA in the last 168 hours. Coagulation Profile: Recent Labs  Lab 09/27/21 0648  INR 1.1   Cardiac Enzymes: No results for input(s): CKTOTAL, CKMB, CKMBINDEX, TROPONINI in the last 168 hours. BNP (last 3 results) No results for input(s): PROBNP in the last 8760 hours. HbA1C: No results for input(s): HGBA1C in the last 72 hours. CBG: Recent Labs  Lab 09/27/21 1556 09/27/21 2217 09/28/21 0606 09/28/21 0800  GLUCAP 104* 89 88 89   Lipid Profile: No results for input(s): CHOL, HDL, LDLCALC, TRIG, CHOLHDL, LDLDIRECT in the last 72 hours. Thyroid Function Tests: No results for input(s): TSH, T4TOTAL, FREET4, T3FREE, THYROIDAB in the last 72 hours. Anemia Panel: No results for input(s): VITAMINB12, FOLATE, FERRITIN, TIBC, IRON, RETICCTPCT in the last 72 hours. Sepsis Labs: Recent Labs  Lab 09/27/21 3220 09/27/21 0803 09/27/21 1000 09/27/21 1620  LATICACIDVEN  3.0* 3.1* 2.6* 1.8    Recent Results (from the past 240 hour(s))  Blood Culture (routine x 2)     Status: None (Preliminary result)   Collection Time: 09/27/21  6:47 AM   Specimen: BLOOD  Result Value Ref Range Status   Specimen Description   Final    BLOOD BLOOD RIGHT FOREARM Performed at [facility], [place] [address] [place], [place] [address]    Special Requests   Final    BOTTLES DRAWN AEROBIC AND ANAEROBIC Blood Culture adequate volume Performed at [facility] [address], [place], [place] [address]    Culture   Final    NO GROWTH < 24 HOURS Performed at [facility] [address] [place], [place] [address]    Report Status PENDING  Incomplete  Resp Panel by RT-PCR (Flu A&B, Covid) Nasopharyngeal Swab     Status: None   Collection Time: 09/27/21  6:48 AM   Specimen: Nasopharyngeal Swab; Nasopharyngeal(NP) swabs in vial transport medium  Result Value Ref Range Status   SARS Coronavirus 2 by RT PCR NEGATIVE NEGATIVE Final    Comment: (NOTE) SARS-CoV-2 target nucleic acids are NOT DETECTED.  The SARS-CoV-2 RNA is generally detectable in upper respiratory specimens during the acute phase of infection. The lowest concentration of SARS-CoV-2 viral copies this assay can detect is 138 copies/mL. A negative result does not preclude SARS-Cov-2 infection and should not be used as the sole basis for treatment or other patient management decisions. A negative result may occur with  improper specimen collection/handling, submission of specimen other than nasopharyngeal swab, presence of viral mutation(s) within the areas targeted by this assay, and inadequate number of viral copies(<138 copies/mL). A negative result must be combined with clinical observations, patient history, and epidemiological information. The expected result is Negative.  Fact Sheet for Patients:  EntrepreneurPulse.com.au  Fact Sheet for  Healthcare Providers:  IncredibleEmployment.be  This test is no t yet approved or cleared by the [place] FDA and  has been authorized for detection and/or diagnosis of SARS-CoV-2 by FDA under an Emergency Use Authorization (EUA). This EUA will remain  in effect (meaning this test can  be used) for the duration of the COVID-19 declaration under Section 564(b)(1) of the Act, 21 U.S.C.section 360bbb-3(b)(1), unless the authorization is terminated  or revoked sooner.       Influenza A by PCR NEGATIVE NEGATIVE Final   Influenza B by PCR NEGATIVE NEGATIVE Final    Comment: (NOTE) The Xpert Xpress SARS-CoV-2/FLU/RSV plus assay is intended as an aid in the diagnosis of influenza from Nasopharyngeal swab specimens and should not be used as a sole basis for treatment. Nasal washings and aspirates are unacceptable for Xpert Xpress SARS-CoV-2/FLU/RSV testing.  Fact Sheet for Patients: EntrepreneurPulse.com.au  Fact Sheet for Healthcare Providers: IncredibleEmployment.be  This test is not yet approved or cleared by the [place] FDA and has been authorized for detection and/or diagnosis of SARS-CoV-2 by FDA under an Emergency Use Authorization (EUA). This EUA will remain in effect (meaning this test can be used) for the duration of the COVID-19 declaration under Section 564(b)(1) of the Act, 21 U.S.C. section 360bbb-3(b)(1), unless the authorization is terminated or revoked.  Performed at [facility], [place] [address], [place], [place] [address]   Urine Culture     Status: Abnormal (Preliminary result)   Collection Time: 09/27/21  6:48 AM   Specimen: In/Out Cath Urine  Result Value Ref Range Status   Specimen Description   Final    IN/OUT CATH URINE Performed at [facility] [address], [place], [place] [address]    Special Requests   Final    NONE Performed at [facility], [place] [address] [place], [place] [address]    Culture >=100,000 COLONIES/mL GRAM NEGATIVE RODS (A)  Final   Report Status PENDING  Incomplete  Blood Culture (routine x 2)     Status: None (Preliminary result)   Collection Time: 09/27/21  6:53 AM   Specimen: BLOOD  Result Value Ref Range Status   Specimen Description   Final    BLOOD BLOOD LEFT FOREARM Performed at [facility] [address], [place], [place] [address]    Special Requests   Final    BOTTLES DRAWN AEROBIC AND ANAEROBIC Blood Culture results may not be optimal due to an inadequate volume of blood received in culture bottles Performed at [facility] [address], [place], [place] [address]    Culture   Final    NO GROWTH < 24 HOURS Performed at [facility] [address] [place], [place] [address]    Report Status PENDING  Incomplete  MRSA Next Gen by PCR, Nasal     Status: Abnormal   Collection Time: 09/27/21 12:34 PM   Specimen: Nasal Mucosa; Nasal Swab  Result Value Ref Range Status   MRSA by PCR Next Gen DETECTED (A) NOT DETECTED Final    Comment: RESULT CALLED TO, READ BACK BY AND VERIFIED WITH:  AMANDA WATSON RN 09/27/21 @ 2993 VS (NOTE) The GeneXpert MRSA Assay (FDA approved for NASAL specimens only), is one component of a comprehensive MRSA colonization surveillance program. It is not intended to diagnose MRSA infection nor to guide or monitor treatment for MRSA infections. Test performance is not FDA approved in patients less than 39 years old. Performed at [facility], [place] [address], [place], [place] [address]     RN Pressure Injury Documentation: Pressure Injury 09/27/21 Coccyx Medial Stage 2 -  Partial thickness loss of dermis presenting as a shallow open injury with a red, pink wound bed  without slough. stage 2, 1.5X.6 (Active)  09/27/21 1300  Location: Coccyx  Location Orientation: Medial  Staging: Stage  2 -  Partial thickness loss of dermis presenting as a shallow open injury with a red, pink wound bed without slough.  Wound Description (Comments): stage 2, 1.5X.6  Present on Admission: Yes     Pressure Injury Buttocks Left;Anterior;Medial Deep Tissue Pressure Injury - Purple or maroon localized area of discolored intact skin or blood-filled blister due to damage of underlying soft tissue from pressure and/or shear. 1.5X1 inner buttock beside s (Active)     Location: Buttocks  Location Orientation: Left;Anterior;Medial  Staging: Deep Tissue Pressure Injury - Purple or maroon localized area of discolored intact skin or blood-filled blister due to damage of underlying soft tissue from pressure and/or shear.  Wound Description (Comments): 1.5X1 inner buttock beside stage 2  Present on Admission: Yes    Estimated body mass index is 23.73 kg/m as calculated from the following:   Height as of this encounter: 6' (1.829 m).   Weight as of this encounter: 79.4 kg.  Malnutrition Type:   Malnutrition Characteristics:   Nutrition Interventions:   Radiology Studies: DG Chest Port 1 View  Result Date: 09/27/2021 CLINICAL DATA:  71 year old male with possible sepsis. EXAM: PORTABLE CHEST 1 VIEW COMPARISON:  Chest x-ray 09/18/2021. FINDINGS: Lung volumes are low. No consolidative airspace disease. No pleural effusions. No pneumothorax. No pulmonary nodule or mass noted. Pulmonary vasculature and the cardiomediastinal silhouette are within normal limits. Atherosclerosis in the thoracic aorta. IMPRESSION: 1. Low lung volumes without radiographic evidence of acute cardiopulmonary disease. 2. Aortic atherosclerosis. Electronically Signed   By: Vinnie Langton M.D.   On: 09/27/2021 07:14    Scheduled Meds:  Chlorhexidine Gluconate Cloth  6 each Topical Daily   insulin aspart  0-9 Units Subcutaneous Q6H   mouth rinse  15 mL Mouth Rinse BID   mupirocin ointment  1 application Nasal BID   Continuous  Infusions:  ceFEPime (MAXIPIME) IV Stopped (09/28/21 0603)    LOS: 0 days   Kerney Elbe, DO [facility] PAGER is on AMION  If 7PM-7AM, please contact night-coverage [email or web address]

## 2021-09-29 ENCOUNTER — Inpatient Hospital Stay (HOSPITAL_COMMUNITY): Payer: Medicare Other

## 2021-09-29 DIAGNOSIS — T17908D Unspecified foreign body in respiratory tract, part unspecified causing other injury, subsequent encounter: Secondary | ICD-10-CM | POA: Diagnosis not present

## 2021-09-29 DIAGNOSIS — E785 Hyperlipidemia, unspecified: Secondary | ICD-10-CM | POA: Diagnosis not present

## 2021-09-29 DIAGNOSIS — B965 Pseudomonas (aeruginosa) (mallei) (pseudomallei) as the cause of diseases classified elsewhere: Secondary | ICD-10-CM

## 2021-09-29 DIAGNOSIS — K219 Gastro-esophageal reflux disease without esophagitis: Secondary | ICD-10-CM | POA: Diagnosis not present

## 2021-09-29 DIAGNOSIS — A419 Sepsis, unspecified organism: Secondary | ICD-10-CM | POA: Diagnosis not present

## 2021-09-29 DIAGNOSIS — B9689 Other specified bacterial agents as the cause of diseases classified elsewhere: Secondary | ICD-10-CM

## 2021-09-29 LAB — COMPREHENSIVE METABOLIC PANEL
ALT: 46 U/L — ABNORMAL HIGH (ref 0–44)
AST: 37 U/L (ref 15–41)
Albumin: 2.1 g/dL — ABNORMAL LOW (ref 3.5–5.0)
Alkaline Phosphatase: 134 U/L — ABNORMAL HIGH (ref 38–126)
Anion gap: 6 (ref 5–15)
BUN: 21 mg/dL (ref 8–23)
CO2: 25 mmol/L (ref 22–32)
Calcium: 8 mg/dL — ABNORMAL LOW (ref 8.9–10.3)
Chloride: 105 mmol/L (ref 98–111)
Creatinine, Ser: 0.63 mg/dL (ref 0.61–1.24)
GFR, Estimated: >60 mL/min (ref >60)
Glucose, Bld: 112 mg/dL — ABNORMAL HIGH (ref 70–99)
Potassium: 3.4 mmol/L — ABNORMAL LOW (ref 3.5–5.1)
Sodium: 136 mmol/L (ref 135–145)
Total Bilirubin: 0.7 mg/dL (ref 0.3–1.2)
Total Protein: 5.6 g/dL — ABNORMAL LOW (ref 6.5–8.1)

## 2021-09-29 LAB — GLUCOSE, CAPILLARY
Glucose-Capillary: 110 mg/dL — ABNORMAL HIGH (ref 70–99)
Glucose-Capillary: 115 mg/dL — ABNORMAL HIGH (ref 70–99)
Glucose-Capillary: 136 mg/dL — ABNORMAL HIGH (ref 70–99)
Glucose-Capillary: 120 mg/dL — ABNORMAL HIGH (ref 70–99)
Glucose-Capillary: 134 mg/dL — ABNORMAL HIGH (ref 70–99)

## 2021-09-29 LAB — CBC WITH DIFFERENTIAL/PLATELET
Abs Immature Granulocytes: 0.06 10*3/uL (ref 0.00–0.07)
Basophils Absolute: 0 10*3/uL (ref 0.0–0.1)
Basophils Relative: 0 %
Eosinophils Absolute: 0.2 10*3/uL (ref 0.0–0.5)
Eosinophils Relative: 3 %
HCT: 33.4 % — ABNORMAL LOW (ref 39.0–52.0)
Hemoglobin: 10.6 g/dL — ABNORMAL LOW (ref 13.0–17.0)
Immature Granulocytes: 1 %
Lymphocytes Relative: 26 %
Lymphs Abs: 1.6 10*3/uL (ref 0.7–4.0)
MCH: 28.3 pg (ref 26.0–34.0)
MCHC: 31.7 g/dL (ref 30.0–36.0)
MCV: 89.3 fL (ref 80.0–100.0)
Monocytes Absolute: 0.6 10*3/uL (ref 0.1–1.0)
Monocytes Relative: 10 %
Neutro Abs: 3.8 10*3/uL (ref 1.7–7.7)
Neutrophils Relative %: 60 %
Platelets: 188 10*3/uL (ref 150–400)
RBC: 3.74 MIL/uL — ABNORMAL LOW (ref 4.22–5.81)
RDW: 13.9 % (ref 11.5–15.5)
WBC: 6.3 10*3/uL (ref 4.0–10.5)
nRBC: 0 % (ref 0.0–0.2)

## 2021-09-29 LAB — MAGNESIUM
Magnesium: 1.9 mg/dL (ref 1.7–2.4)
Magnesium: 1.9 mg/dL (ref 1.7–2.4)

## 2021-09-29 LAB — PHOSPHORUS
Phosphorus: 3.4 mg/dL (ref 2.5–4.6)
Phosphorus: 3.3 mg/dL (ref 2.5–4.6)

## 2021-09-29 MED ORDER — PIPERACILLIN-TAZOBACTAM 3.375 G IVPB
3.3750 g | Freq: Three times a day (TID) | INTRAVENOUS | Status: AC
  Administered 2021-09-29 – 2021-10-05 (×20): 3.375 g via INTRAVENOUS
  Filled 2021-09-29 (×21): qty 50

## 2021-09-29 NOTE — Progress Notes (Signed)
Inpatient Rehab Admissions Coordinator:   Per therapy recommendations pt was screened for CIR by Estill Dooms, PT, DPT.  Note that insurance authorization would be difficult (though not impossible) to get for CIR.  I do feel he could be a potential candidate, though, and I will place a consult order, per our protocol, so we can follow up for a full assessment.    Estill Dooms, PT, DPT Admissions Coordinator [phone] 09/29/21  12:56 PM

## 2021-09-29 NOTE — Evaluation (Signed)
Occupational Therapy Evaluation Patient Details Name: Raymond Stuart MRN: [id number] DOB: 10/20/1951 Today's Date: 09/29/2021   History of Present Illness 70 y.o. male admitted for sepsis secondary likely aspiration pnuemonia with UTI. PMH significant for GERD, OA, recently admitted to [facility] for Subdural hematoma, intaparyenchemal hemorrhage, ventricular hemorrhage, ischemic stroke.   Clinical Impression   PTA, pt had arrived to SNF (only there for 1 day), required Max-total A for hoyer to recliner and required extensive assist with ADLs. Per wife, pt was independent at baseline 5 weeks ago before accident and stroke. Upon evaluation pt with decreased functional strength (especially LUE and LLE) and coordination limiting functional UE and LE use. Pt also with decreased activity tolerance, balance, and cognition. Clock drawing test revealed visuospatial deficits, perseveration, decreased attention, problem solving, and short term recall. Pt wife reports he is much more aware and alert than PTA and and that pt was "much better". Pt currently requires Max A for UB ADLs, Total A for LB ADLs, and Max A +2 for partial sit to stand with stedy. Patient will benefit from skilled OT services while in hospital to improve deficits and learn compensatory strategies as needed in order to return to PLOF.  Pt wife expressed strong wish to pursue CIR, pt in agreeance. Due to pts motivation, good caregiver support, current level of function, and need for additional rehabilitation, recommending CIR post acute stay.      Recommendations for follow up therapy are one component of a multi-disciplinary discharge planning process, led by the attending physician.  Recommendations may be updated based on patient status, additional functional criteria and insurance authorization.   Follow Up Recommendations  Acute inpatient rehab (3hours/day)    Assistance Recommended at Discharge    Functional Status Assessment  Patient has  had a recent decline in their functional status and demonstrates the ability to make significant improvements in function in a reasonable and predictable amount of time.  Equipment Recommendations  Other (comment) (TBD)    Recommendations for Other Services       Precautions / Restrictions Precautions Precautions: Fall Restrictions Weight Bearing Restrictions: No      Mobility Bed Mobility Overal bed mobility: Needs Assistance Bed Mobility: Supine to Sit     Supine to sit: Max assist;+2 for physical assistance;HOB elevated     General bed mobility comments: required +2 with one therapist in back supporting trunk and one in front pivoting LEs off EOB into sitting    Transfers Overall transfer level: Needs assistance   Transfers: Sit to/from Stand Sit to Stand: Max assist;+2 physical assistance;+2 safety/equipment;From elevated surface           General transfer comment: partial stand with +2 assist and BUE support with stedty Transfer via Lift Equipment: Stedy    Balance Overall balance assessment: Needs assistance Sitting-balance support: Feet supported;No upper extremity supported Sitting balance-Leahy Scale: Fair Sitting balance - Comments: satic sitting fair, dynamic poor                                   ADL either performed or assessed with clinical judgement   ADL Overall ADL's : Needs assistance/impaired Eating/Feeding: Set up;Sitting   Grooming: Moderate assistance;Sitting   Upper Body Bathing: Maximal assistance;Sitting   Lower Body Bathing: Total assistance;Sitting/lateral leans;+2 for physical assistance   Upper Body Dressing : Maximal assistance;Sitting   Lower Body Dressing: Total assistance;+2 for physical assistance;Sit to/from stand  Toilet Transfer Details (indicate cue type and reason): toileting bed level at this time Toileting- Clothing Manipulation and Hygiene: Maximal assistance;Bed level       Functional  mobility during ADLs: +2 for safety/equipment;+2 for physical assistance;Maximal assistance       Vision Patient Visual Report: Blurring of vision Vision Assessment?: Vision impaired- to be further tested in functional context Additional Comments: Pt able to visually track all cardinal points, however required min verbal cues for attention to visual stimuli after ~15 seconds of testing. reported blurry vision when thearpist hand moved away from face, but able to read sign on opposite side of room with accuracy     Perception     Praxis      Pertinent Vitals/Pain Pain Assessment: Faces Faces Pain Scale: Hurts little more Pain Location: hip - left and aching in neck Pain Descriptors / Indicators: Aching;Sharp;Grimacing Pain Intervention(s): Monitored during session;Limited activity within patient's tolerance;Repositioned     Hand Dominance     Extremity/Trunk Assessment Upper Extremity Assessment Upper Extremity Assessment: RUE deficits/detail;LUE deficits/detail RUE Deficits / Details: MMT 3+/5, biceps and triceps 4/5, gross grip 4+/5 RUE Sensation: WNL RUE Coordination: decreased fine motor LUE Deficits / Details: MMT 2/5, biceps and triceps 4/5, gross grip 3+/5 LUE: Shoulder pain with ROM LUE Sensation: WNL LUE Coordination: decreased fine motor   Lower Extremity Assessment Lower Extremity Assessment: Defer to PT evaluation   Cervical / Trunk Assessment Cervical / Trunk Assessment: Kyphotic   Communication Communication Communication: No difficulties   Cognition Arousal/Alertness: Awake/alert Behavior During Therapy: Flat affect Overall Cognitive Status: Impaired/Different from baseline Area of Impairment: Orientation;Attention;Following commands;Safety/judgement;Awareness;Problem solving                 Orientation Level: Person;Place;Time;Situation Current Attention Level: Sustained   Following Commands: Follows one step commands with increased  time;Follows multi-step commands inconsistently Safety/Judgement: Decreased awareness of safety Awareness: Emergent Problem Solving: Slow processing;Requires verbal cues General Comments: A&O x4     General Comments       Exercises Exercises: Other exercises (educated spouse and pt on performing BUE hand gross flexion/extension and ankle pumps)   Shoulder Instructions      Home Living Family/patient expects to be discharged to:: Skilled nursing facility Living Arrangements: Spouse/significant other Available Help at Discharge: Family Type of Home: House Home Access: Level entry     Home Layout: One level     Bathroom Shower/Tub: Walk-in shower         Home Equipment: Shower seat;Rolling Environmental consultant (2 wheels)   Additional Comments: lived with wife before accident and stroke. discharged to SNF for one day before admittance to [facility]      Prior Functioning/Environment Prior Level of Function : Needs assist  Cognitive Assist : ADLs (cognitive);Mobility (cognitive)     Physical Assist : ADLs (physical);Mobility (physical)     Mobility Comments: required hoyer lift from bed to recliner in SNF ADLs Comments: max to total assist with ADLs bed level.        OT Problem List: Decreased strength;Decreased activity tolerance;Decreased range of motion;Impaired balance (sitting and/or standing);Decreased coordination;Decreased cognition;Decreased safety awareness;Decreased knowledge of use of DME or AE;Decreased knowledge of precautions;Pain;Impaired UE functional use      OT Treatment/Interventions: Self-care/ADL training;Therapeutic exercise;Neuromuscular education;DME and/or AE instruction;Therapeutic activities;Cognitive remediation/compensation;Visual/perceptual remediation/compensation;Patient/family education;Balance training;Manual therapy    OT Goals(Current goals can be found in the care plan section) Acute Rehab OT Goals Patient Stated Goal: to use BSC OT Goal Formulation:  With patient Time For Goal  Achievement: 10/13/21 Potential to Achieve Goals: Good  OT Frequency: Min 2X/week   Barriers to D/C:            Co-evaluation              AM-PAC OT "6 Clicks" Daily Activity     Outcome Measure Help from another person eating meals?: A Little Help from another person taking care of personal grooming?: A Lot Help from another person toileting, which includes using toliet, bedpan, or urinal?: A Lot Help from another person bathing (including washing, rinsing, drying)?: A Lot Help from another person to put on and taking off regular upper body clothing?: A Lot Help from another person to put on and taking off regular lower body clothing?: Total 6 Click Score: 12   End of Session Equipment Utilized During Treatment: Other (comment);Gait belt (stedy) Nurse Communication: Mobility status;Other (comment) (pt needing assistance to clean up after BM)  Activity Tolerance: Patient tolerated treatment well Patient left: with call bell/phone within reach;with bed alarm set;in bed;with family/visitor present  OT Visit Diagnosis: Unsteadiness on feet (R26.81);Other abnormalities of gait and mobility (R26.89);Muscle weakness (generalized) (M62.81);History of falling (Z91.81);Pain;Hemiplegia and hemiparesis                Time: 0815-0916 OT Time Calculation (min): 61 min Charges:  OT General Charges $OT Visit: 1 Visit OT Evaluation $OT Eval Moderate Complexity: 1 Mod OT Treatments $Therapeutic Activity: 23-37 mins  Prudence Davidson, OTS Acute Rehab Office: [phone]   [employer] 09/29/2021, 9:45 AM

## 2021-09-29 NOTE — Evaluation (Signed)
Physical Therapy Evaluation Patient Details Name: Raymond Stuart MRN: [id number] DOB: 04/21/1951 Today's Date: 09/29/2021  History of Present Illness  70 y.o. male admitted for sepsis secondary likely aspiration pnuemonia with UTI. PMH significant for GERD, OA, recently admitted to [facility] for Subdural hematoma, intaparyenchemal hemorrhage, ventricular hemorrhage, ischemic stroke.  Clinical Impression  Pt admitted with above diagnosis. +2 max assist for supine to sit and for sit to stand with stedy x 5 trials. Pt not able to fully come to upright position but came to partial stand for transfer to bedside commode, pericare, and transfer to recliner. Pt sat at EOB and in [place] for total of ~35 minutes. He puts forth good effort, was alert and oriented throughout PT session. He is a good CIR candidate.  Pt currently with functional limitations due to the deficits listed below (see PT Problem List). Pt will benefit from skilled PT to increase their independence and safety with mobility to allow discharge to the venue listed below.          Recommendations for follow up therapy are one component of a multi-disciplinary discharge planning process, led by the attending physician.  Recommendations may be updated based on patient status, additional functional criteria and insurance authorization.  Follow Up Recommendations Acute inpatient rehab (3hours/day)    Assistance Recommended at Discharge Frequent or constant Supervision/Assistance  Functional Status Assessment Patient has had a recent decline in their functional status and demonstrates the ability to make significant improvements in function in a reasonable and predictable amount of time.  Equipment Recommendations  Other (comment) (TBD at next venue)    Recommendations for Other Services Rehab consult     Precautions / Restrictions Precautions Precautions: Fall Precaution Comments: g-tube RLQ Restrictions Weight Bearing Restrictions: No       Mobility  Bed Mobility Overal bed mobility: Needs Assistance Bed Mobility: Supine to Sit     Supine to sit: Max assist;+2 for physical assistance;HOB elevated     General bed mobility comments: required +2 with one therapist in back supporting trunk and one in front pivoting LEs off EOB into sitting    Transfers Overall transfer level: Needs assistance   Transfers: Sit to/from Stand;Stand Pivot Transfers Sit to Stand: Max assist;+2 physical assistance;+2 safety/equipment;From elevated surface Stand pivot transfers: Total assist;+2 physical assistance         General transfer comment: pt came to partial stand with +2 assist and BUE support with stedy x 5 trials for transfer to bedside commode, pericare, then transfer to recliner; hips flexed in standing. Pt able to assist with pulling up on Stedy bar with BUEs, BLEs fatigued very quickly, was unable to come to full upright stand. Transfer via Lift Equipment: Stedy  Ambulation/Gait             General Gait Details: unable  Information systems manager Rankin (Stroke Patients Only)       Balance Overall balance assessment: Needs assistance Sitting-balance support: Feet supported;No upper extremity supported Sitting balance-Leahy Scale: Fair Sitting balance - Comments: static sitting fair, dynamic poor; sat at EOB and sat on Stedy for total of ~35 minutes. Pt reported feeling lightheaded in sitting, vitals: BP 134/85, SaO2 95% on RA, HR 104.  Pertinent Vitals/Pain Pain Assessment: Faces Faces Pain Scale: Hurts even more Pain Location: L knee with movement Pain Descriptors / Indicators: Aching;Sharp;Grimacing Pain Intervention(s): Limited activity within patient's tolerance;Monitored during session;Repositioned    Home Living Family/patient expects to be discharged to:: Skilled nursing facility Living Arrangements: Spouse/significant  other Available Help at Discharge: Family Type of Home: House Home Access: Level entry       Home Layout: One level Home Equipment: Pharmacist, hospital (2 wheels) Additional Comments: lived with wife before accident and stroke. discharged to SNF for one day before admittance to [facility]. At baseline was independent with mobility.    Prior Function Prior Level of Function : Needs assist  Cognitive Assist : ADLs (cognitive);Mobility (cognitive)     Physical Assist : ADLs (physical);Mobility (physical)     Mobility Comments: required hoyer lift from bed to recliner in SNF ADLs Comments: max to total assist with ADLs bed level.     Hand Dominance        Extremity/Trunk Assessment   Upper Extremity Assessment Upper Extremity Assessment: Defer to OT evaluation RUE Deficits / Details: MMT 3+/5, biceps and triceps 4/5, gross grip 4+/5 RUE Sensation: WNL RUE Coordination: decreased fine motor LUE Deficits / Details: MMT 2/5, biceps and triceps 4/5, gross grip 3+/5 LUE: Shoulder pain with ROM LUE Sensation: WNL LUE Coordination: decreased fine motor    Lower Extremity Assessment Lower Extremity Assessment: RLE deficits/detail;LLE deficits/detail RLE Deficits / Details: knee ext 4/5, reports B feet feel "a little numb" RLE Sensation: decreased light touch RLE Coordination: WNL LLE Deficits / Details: knee ext +2/5 limited by pain LLE Sensation: decreased light touch    Cervical / Trunk Assessment Cervical / Trunk Assessment: Kyphotic  Communication   Communication: No difficulties  Cognition Arousal/Alertness: Awake/alert Behavior During Therapy: Flat affect Overall Cognitive Status: Impaired/Different from baseline Area of Impairment: Orientation;Attention;Following commands;Safety/judgement;Awareness;Problem solving                 Orientation Level: Person;Place;Time;Situation Current Attention Level: Sustained   Following Commands: Follows one step  commands with increased time;Follows multi-step commands inconsistently Safety/Judgement: Decreased awareness of safety Awareness: Emergent Problem Solving: Slow processing;Requires verbal cues General Comments: A&O x4        General Comments      Exercises     Assessment/Plan    PT Assessment Patient needs continued PT services  PT Problem List Decreased strength;Decreased mobility;Decreased activity tolerance;Decreased balance;Pain;Decreased cognition       PT Treatment Interventions Therapeutic activities;Therapeutic exercise;Functional mobility training;Patient/family education;Cognitive remediation;DME instruction;Wheelchair mobility training    PT Goals (Current goals can be found in the Care Plan section)  Acute Rehab PT Goals Patient Stated Goal: to walk, return to doing yardwork, playing with 6 y.o. granddaughter PT Goal Formulation: With patient/family Time For Goal Achievement: 10/13/21 Potential to Achieve Goals: Good    Frequency Min 3X/week   Barriers to discharge        Co-evaluation               AM-PAC PT "6 Clicks" Mobility  Outcome Measure Help needed turning from your back to your side while in a flat bed without using bedrails?: A Lot Help needed moving from lying on your back to sitting on the side of a flat bed without using bedrails?: Total Help needed moving to and from a bed to a chair (including a wheelchair)?: Total Help needed standing up from a chair using your arms (e.g., wheelchair or bedside chair)?: Total Help needed  to walk in hospital room?: Total Help needed climbing 3-5 steps with a railing? : Total 6 Click Score: 7    End of Session Equipment Utilized During Treatment: Gait belt Activity Tolerance: Patient limited by fatigue;Patient tolerated treatment well Patient left: in chair;with call bell/phone within reach;with nursing/sitter in room;with chair alarm set (lift pad in recliner) Nurse Communication: Mobility  status;Need for lift equipment PT Visit Diagnosis: Difficulty in walking, not elsewhere classified (R26.2);Muscle weakness (generalized) (M62.81);Pain;Unsteadiness on feet (R26.81) Pain - Right/Left: Left Pain - part of body: Knee    Time: 0165-5374 PT Time Calculation (min) (ACUTE ONLY): 67 min   Charges:   PT Evaluation $PT Eval High Complexity: 1 High PT Treatments $Therapeutic Activity: 38-52 mins        Ralene Bathe Kistler PT 09/29/2021  Acute Rehabilitation Services Pager [phone] Office [phone]

## 2021-09-29 NOTE — Progress Notes (Signed)
Modified Barium Swallow Progress Note  Patient Details  Name: Raymond Stuart MRN: [id number] Date of Birth: [birth year]  Today's Date: 09/29/2021  Modified Barium Swallow completed.  Full report located under Chart Review in the Imaging Section.  Brief recommendations include the following:  Clinical Impression  Pt's swallow function appears to have improved since 11/1 MBS according to review of documentation from [facility].  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  Oral phase is only mildly impacted.  Primary issues are related to reduced base of tongue thrust and reduced pharyngeal squeeze, leading to incomplete epiglottic inversion beyond the horizontal and consistent residue in the valleculae and pyriform sinuses.  Residue was greater with heavier material (puree, cracker) and reduced with thin and nectar liquids - however, the liquids had a greater tendency to spill from pyriforms into the larynx AFTER the swallow response.  Mobility of the larynx was improved. Nectars tended to penetrate the larynx and rest on the vocal folds (trace amounts). Thin liquids were aspirated - also in trace amounts - and immediately evoked a cough response. Chin tucks and head turns did not appear to provide much help in airway protection or clearance.   Recommend continuing the ice chip/water protocol after oral care. Initiate trials of nectar liquids with SLP only and resume therapeutic exercise while in acute care.   After session, spent thirty minutes in room educating pt and his wife re: results of MBS. We reviewed the video of the study and pt's improvements, including oral movement/control and a reliable cough response when aspiration occurs. Pt brushed his teeth after set-up and fed himself several ice chips with active mastication, mild throat-clearing post-swallow.  He was hoping for a sip of mountain dew, so with supervision he consumed two small sips and seemed to derive some  satisfaction.   Pt is an excellent candidate for CIR and I anticipate he will make steady gains toward resuming a PO diet.  SLP will follow.   Swallow Evaluation Recommendations       SLP Diet Recommendations: Ice chips PRN after oral care;Free water protocol after oral care;NPO   Liquid Administration via: Spoon   Medication Administration: Via alternative means   Supervision: Patient able to self feed   Compensations: Minimize environmental distractions       Oral Care Recommendations: Oral care QID      Aboubacar Matsuo L. Samson Frederic, MA CCC/SLP Acute Rehabilitation Services Office number [phone] Pager [phone]   Blenda Mounts Laurice 09/29/2021,4:23 PM

## 2021-09-29 NOTE — Progress Notes (Signed)
PROGRESS NOTE    Raymond Stuart  EBR:[id number] DOB: December 22, 1950 DOA: 09/27/2021 PCP: Glendon Axe, MD  Brief Narrative:  The patient is a 70 year old Caucasian male with a past medical history significant for but not limited to GERD, hyperlipidemia, osteoarthritis, urolithiasis as well as other comorbidities who was recently admitted to [facility] and then [facility] after having a subdural hematoma, traumatic intraparenchymal hemorrhage, ventricular hemorrhage in the setting of acute head injury with occipital fracture, ischemic stroke followed by aspiration pneumonia in setting of stroke dysphagia status post PEG placement, transaminitis, AKI, acute urinary tension with indwelling Foley catheter was then transferred from his rehab facility yesterday morning after being found covered in vomit and febrile with 101.2 as well as being dyspneic and hypoxic in the 80s.  He responded to nonrebreather oxygen at 10L/min and then subsequently was brought to the ED.  At the facility he was started getting tube feedings around 11 PM and he has been bedbound since his stroke.  In the ED he is noted to be septic with a temperature of 101.2 and a pulse of 120 and respirations of 25.  He had a new O2 requirement and was placed on 3 L supplemental oxygen via nasal cannula.  His urinalysis was concerning for urinary tract infection and he had microscopic blood with 50 RBCs and 50 WBCs and many bacteria.  WBC was elevated at 15.3 and lactic acid was elevated.  He was also found to have some mildly abnormal LFTs.  Chest x-ray was done and showed low lung volumes without any evidence of acute cardiopulmonary disease.  He was admitted for sepsis secondary to likely aspiration pneumonia with concomitant UTI and he is improving.  Assessment & Plan:   Principal Problem:   Sepsis due to undetermined organism [facility]) Active Problems:   Lactic acidosis   GERD (gastroesophageal reflux disease)   Type 2 diabetes  mellitus with hyperglycemia (HCC)   Hyperlipidemia   Transaminitis   Moderate protein malnutrition (HCC)   Pressure injury of skin   Protein-calorie malnutrition, severe   Aspiration into airway  Severe sepsis secondary to likely aspiration pneumonia with concomitant catheter associated Klebsiella and Pseudomonas UTI, present on admission -He was placed in the stepdown unit -Met sepsis criteria on admission as he is febrile, tachypneic, tachycardic, had a leukocytosis as well as a source of infection as well as a new O2 requirement -He was given IV fluid hydration with LR at 150 MLS per hour which is now stopped after 20 hours -Lactic acid was elevated at 3.1 and then trended down to 1.8 with fluid resuscitation -Patient has a chronic Foley since his last admission and he was unable to have it discontinued at the last hospitalization; he presented with a Foley and so this was changed yesterday but did show signs of an infection -WBC has trended down from 15.3 -> 9.5 -> 6.3 -Blood cultures x2 show no growth to date less than 24 hours -He had a urinalysis done which showed a cloudy appearance with amber color urine, large hemoglobin, moderate leukocytes, many bacteria, greater than 50 RBCs per high-power field, greater than 50 WBCs and a urine culture that shows greater than 100,000 colony-forming unit of gram-negative rods and showing >100,000 of Klebsiella Pneumoniae and >100,000 of Pseudomonas Aeruginosa  with Sensitivities pending  -Will change IV Abx to IV Zosyn -We will obtain SLP evaluation they are planning on doing a modified barium swallow today  -Continue to follow cultures and continue Abx as  above -Added Guaifenesin 5 mL every 4 hours to loosen cough and some -Continue to monitor temperature curve and WBC and follow-up on the cultures  Acute respiratory failure with hypoxia in the setting of aspiration from his vomit -Noted to have an O2 saturation in the 80s on arrival to the ED  had to be placed on a nonrebreather -SpO2: 94 % O2 Flow Rate (L/min): 1 L/min -Now was weaned off of supplemental oxygen via nasal cannula -Continuous pulse oximetry maintain O2 saturation greater than 90% -Continue supplemental oxygen via nasal cath warranted and wean to room air as able -Repeat CXR this AM showed "There is interval increase in interstitial markings in the mid and lower lung fields suggesting interstitial edema or interstitial pneumonitis. Part of this finding may be due to poor inspiration. Linear densities in the left mid and right lower lung fields suggest subsegmental atelectasis. Possible minimal left pleural effusion." -We will add Xopenex/Atrovent every 6 as needed for wheezing and shortness of breath  Pressure ulcer -Present on admission -Continue with local and preventive measures and will consult wound care for further evaluation -Continue with Gerhardt's Butt cream topically 2 times daily  GERD/GI prophylaxis -Continue with PPI with Lansoprazole 15 mg per tube  Diabetes mellitus type 2 with hyperglycemia -Continue with sensitive NovoLog sliding scale insulin every 4h -Continue to monitor blood sugars carefully; CBG's have been ranging from 88-115  Dysphagia in the setting of CVA -Continue tube feedings with Jevity at 60 mL/h continuous along with Prosource plus tube feedings 45 mL per tube twice daily and 150 mL free water flushes every 4h -Have consulted SLP for further evaluation and they are planning on MBS to be done   Normocytic Anemia -Patient's hemoglobin/hematocrit went from 13.6/42.6 and trended down to 10.9/33.8 -> 10.6/33.4 -Likely dilutional drop in the setting of IV fluid resuscitation -Check anemia panel in the a.m. -Continue to monitor for signs and symptoms of bleeding; currently no overt bleeding noted -Repeat CBC in a.m.  Hypokalemia  -Mild. K+ went from 4.2 -> 3.4 -Replete with po Kcl 40 mEQ BID x2 via PEG -Continue to Monitor and  Replete as Necessary -Repeat CMP in the AM   Hyperlipidemia  -Holding statin in the setting of his transaminitis and abnormal LFTs  Abnormal LFTs -Improving; likely in the setting of sepsis -AST went from 46 and trended down to 33 -> 37 -ALT went from 78 and trended down to 49 -> 46 -Continue to monitor and trend hepatic function carefully and not improving worsening will consider a right upper quadrant ultrasound as well as an acute hepatitis panel -Repeat CMP in a.m.  Generalized weakness and deconditioning in the setting of his acute CVA and prolonged hospitalization -Has some residual left hemiplegia -Resume ASA and Atorvastatin  -We will consult PT and OT to further evaluate and treat and they are recommending Inpatient Rehab -Rehabs Coordinator consulted and feel that he could be a potential candidate though Insurance Auth may be difficult   Severe malnutrition in the context of acute illness/injury -Nutritionist has been consulted for further evaluation recommendations -Have ordered the patient Jevity 1.5 x 30 mL/h to advance by 10 mils per hour every 8 hours to reach a goal rate of 60 mils per hour with 45 mL of Prosource TF twice daily and 150 mL free water flushes every 4 hours -They are recommending slow advancement due to concern for aspiration -SLP consulted for MBS and pending to be done  DVT prophylaxis: SCDs; will consider  pharmacological prophylaxis in the morning with enoxaparin Code Status: FULL CODE Family Communication: Discussed with wife at bedside  Disposition Plan: Pending further clinical improvement and evaluation by PT and OT as well as SLP; Potentially CIR for Rehab   Status is: Inpatient   The patient will require care spanning > 2 midnights and should be moved to inpatient because: Patient was meeting sepsis criteria and is improving however still not back to his baseline.  SLP is evaluating and planning for an MBS in the morning and PT OT will still  need to see the patient for further evaluation and recommending Inpatient Rehab.   Consultants:  None  Procedures: None  Antimicrobials:  Anti-infectives (From admission, onward)    Start     Dose/Rate Route Frequency Ordered Stop   09/28/21 1200  Ampicillin-Sulbactam (UNASYN) 3 g in sodium chloride 0.9 % 100 mL IVPB        3 g 200 mL/hr over 30 Minutes Intravenous Every 6 hours 09/28/21 1012     09/27/21 1700  ceFEPIme (MAXIPIME) 2 g in sodium chloride 0.9 % 100 mL IVPB  Status:  Discontinued        2 g 200 mL/hr over 30 Minutes Intravenous Every 8 hours 09/27/21 0848 09/28/21 1012   09/27/21 0815  ceFEPIme (MAXIPIME) 2 g in sodium chloride 0.9 % 100 mL IVPB        2 g 200 mL/hr over 30 Minutes Intravenous  Once 09/27/21 0800 09/27/21 0943   09/27/21 0645  cefTRIAXone (ROCEPHIN) 2 g in sodium chloride 0.9 % 100 mL IVPB  Status:  Discontinued        2 g 200 mL/hr over 30 Minutes Intravenous Every 24 hours 09/27/21 0642 09/27/21 0800   09/27/21 0645  azithromycin (ZITHROMAX) 500 mg in sodium chloride 0.9 % 250 mL IVPB  Status:  Discontinued        500 mg 250 mL/hr over 60 Minutes Intravenous Every 24 hours 09/27/21 0642 09/27/21 0801        Subjective: Seen and examined at bedside and he was having some pain in his buttocks.  No chest pain or shortness of breath.  Felt okay.  No lightheadedness or dizziness.  Working with therapy and had some pain in his leg as well.  No other concerns complaints this time.  Objective: Vitals:   09/28/21 1842 09/28/21 2346 09/29/21 0358 09/29/21 0642  BP: 105/68 (!) 141/71 138/69 136/63  Pulse: 90 87 88 85  Resp: _0 Temp: 98.2 F (36.8 C) (!) 97.5 F (36.4 C) 97.8 F (36.6 C) 98.6 F (37 C)  TempSrc: Oral Oral Oral Oral  SpO2: 94% 95% 96% 94%  Weight:      Height:        Intake/Output Summary (Last 24 hours) at 09/29/2021 0858 Last data filed at 09/29/2021 0500 Gross per 24 hour  Intake 300 ml  Output 1475 ml  Net -1175  ml    Filed Weights   09/27/21 0601  Weight: 79.4 kg   Examination: Physical Exam:  Constitutional: WN/WD chronically ill-appearing Caucasian male currently in no acute distress appears calm Eyes: Lids and conjunctivae normal, sclerae anicteric  ENMT: External Ears, Nose appear normal. Grossly normal hearing. Mucous membranes are moist.  Neck: Appears normal, supple, no cervical masses, normal ROM, no appreciable thyromegaly; no appreciable JVD Respiratory: Diminished to auscultation bilaterally with coarse breath sounds and some slight rhonchi, no wheezing, rales, or crackles. Normal respiratory effort and patient  is not tachypenic. No accessory muscle use.  Unlabored breathing Cardiovascular: RRR, no murmurs / rubs / gallops. S1 and S2 auscultated.  Mild extremity edema. 2+ pedal pulses. No carotid bruits.  Abdomen: Soft, non-tender, nondistended.  Has a PEG tube in place.-The mid abdomen. Bowel sounds positive.  GU: Deferred.  Foley catheter is in place Musculoskeletal: No clubbing / cyanosis of digits/nails. No joint deformity upper and lower extremities.  Skin: Has a pressure ulcer noted on his backside no induration; Warm and dry.  Neurologic: CN 2-12 grossly intact with no focal deficits. Romberg sign and cerebellar reflexes not assessed.  Has left-sided weakness and some mild hemiplegia Psychiatric: Normal judgment and insight. Alert and oriented x 3. Normal mood and appropriate affect.   Data Reviewed: I have personally reviewed following labs and imaging studies  CBC: Recent Labs  Lab 09/27/21 0648 09/28/21 0305 09/29/21 0509  WBC 15.3* 9.5 6.3  NEUTROABS 12.1* 6.1 3.8  HGB 13.6 10.9* 10.6*  HCT 42.6 33.8* 33.4*  MCV 90.6 90.4 89.3  PLT 280 171 353    Basic Metabolic Panel: Recent Labs  Lab 09/27/21 0648 09/28/21 0305 09/28/21 1309 09/28/21 1821 09/29/21 0509  NA 137 137  --   --  136  K 4.4 4.2  --   --  3.4*  CL 105 107  --   --  105  CO2 23 24  --   --   25  GLUCOSE 161* 83  --   --  112*  BUN 27* 20  --   --  21  CREATININE 0.91 0.60*  --   --  0.63  CALCIUM 8.7* 8.3*  --   --  8.0*  MG  --   --  2.1 1.9 1.9  PHOS  --   --  3.1 3.6 3.4    GFR: Estimated Creatinine Clearance: 97 mL/min (by C-G formula based on SCr of 0.63 mg/dL). Liver Function Tests: Recent Labs  Lab 09/27/21 0648 09/28/21 0305 09/29/21 0509  AST 46* 33 37  ALT 78* 49* 46*  ALKPHOS 160* 113 134*  BILITOT 0.9 0.8 0.7  PROT 7.0 5.6* 5.6*  ALBUMIN 2.6* 2.0* 2.1*    No results for input(s): LIPASE, AMYLASE in the last 168 hours. No results for input(s): AMMONIA in the last 168 hours. Coagulation Profile: Recent Labs  Lab 09/27/21 0648  INR 1.1    Cardiac Enzymes: No results for input(s): CKTOTAL, CKMB, CKMBINDEX, TROPONINI in the last 168 hours. BNP (last 3 results) No results for input(s): PROBNP in the last 8760 hours. HbA1C: No results for input(s): HGBA1C in the last 72 hours. CBG: Recent Labs  Lab 09/28/21 1724 09/28/21 2050 09/28/21 2348 09/29/21 0400 09/29/21 0751  GLUCAP 92 91 106* 110* 115*    Lipid Profile: No results for input(s): CHOL, HDL, LDLCALC, TRIG, CHOLHDL, LDLDIRECT in the last 72 hours. Thyroid Function Tests: No results for input(s): TSH, T4TOTAL, FREET4, T3FREE, THYROIDAB in the last 72 hours. Anemia Panel: No results for input(s): VITAMINB12, FOLATE, FERRITIN, TIBC, IRON, RETICCTPCT in the last 72 hours. Sepsis Labs: Recent Labs  Lab 09/27/21 0648 09/27/21 0803 09/27/21 1000 09/27/21 1620  LATICACIDVEN 3.0* 3.1* 2.6* 1.8     Recent Results (from the past 240 hour(s))  Blood Culture (routine x 2)     Status: None (Preliminary result)   Collection Time: 09/27/21  6:47 AM   Specimen: BLOOD  Result Value Ref Range Status   Specimen Description   Final  BLOOD BLOOD RIGHT FOREARM Performed at [facility] [address] [place], [place] [address]    Special Requests   Final    BOTTLES  DRAWN AEROBIC AND ANAEROBIC Blood Culture adequate volume Performed at [facility] [address], [place], [place] [address]    Culture   Final    NO GROWTH < 24 HOURS Performed at [facility] [address] [place], [place] [address]    Report Status PENDING  Incomplete  Resp Panel by RT-PCR (Flu A&B, Covid) Nasopharyngeal Swab     Status: None   Collection Time: 09/27/21  6:48 AM   Specimen: Nasopharyngeal Swab; Nasopharyngeal(NP) swabs in vial transport medium  Result Value Ref Range Status   SARS Coronavirus 2 by RT PCR NEGATIVE NEGATIVE Final    Comment: (NOTE) SARS-CoV-2 target nucleic acids are NOT DETECTED.  The SARS-CoV-2 RNA is generally detectable in upper respiratory specimens during the acute phase of infection. The lowest concentration of SARS-CoV-2 viral copies this assay can detect is 138 copies/mL. A negative result does not preclude SARS-Cov-2 infection and should not be used as the sole basis for treatment or other patient management decisions. A negative result may occur with  improper specimen collection/handling, submission of specimen other than nasopharyngeal swab, presence of viral mutation(s) within the areas targeted by this assay, and inadequate number of viral copies(<138 copies/mL). A negative result must be combined with clinical observations, patient history, and epidemiological information. The expected result is Negative.  Fact Sheet for Patients:  EntrepreneurPulse.com.au  Fact Sheet for Healthcare Providers:  IncredibleEmployment.be  This test is no t yet approved or cleared by the [place] FDA and  has been authorized for detection and/or diagnosis of SARS-CoV-2 by FDA under an Emergency Use Authorization (EUA). This EUA will remain  in effect (meaning this test can be used) for the duration of the COVID-19 declaration under Section 564(b)(1) of the Act, 21 U.S.C.section  360bbb-3(b)(1), unless the authorization is terminated  or revoked sooner.       Influenza A by PCR NEGATIVE NEGATIVE Final   Influenza B by PCR NEGATIVE NEGATIVE Final    Comment: (NOTE) The Xpert Xpress SARS-CoV-2/FLU/RSV plus assay is intended as an aid in the diagnosis of influenza from Nasopharyngeal swab specimens and should not be used as a sole basis for treatment. Nasal washings and aspirates are unacceptable for Xpert Xpress SARS-CoV-2/FLU/RSV testing.  Fact Sheet for Patients: EntrepreneurPulse.com.au  Fact Sheet for Healthcare Providers: IncredibleEmployment.be  This test is not yet approved or cleared by the [place] FDA and has been authorized for detection and/or diagnosis of SARS-CoV-2 by FDA under an Emergency Use Authorization (EUA). This EUA will remain in effect (meaning this test can be used) for the duration of the COVID-19 declaration under Section 564(b)(1) of the Act, 21 U.S.C. section 360bbb-3(b)(1), unless the authorization is terminated or revoked.  Performed at [facility], [place] [address] [place], [place] [address]   Urine Culture     Status: Abnormal (Preliminary result)   Collection Time: 09/27/21  6:48 AM   Specimen: In/Out Cath Urine  Result Value Ref Range Status   Specimen Description   Final    IN/OUT CATH URINE Performed at [facility] [address], [place], [place] [address]    Special Requests   Final    NONE Performed at [facility], [place] [address] [place], [place] [address]    Culture (A)  Final    >=100,000 COLONIES/mL KLEBSIELLA PNEUMONIAE >=100,000 COLONIES/mL PSEUDOMONAS AERUGINOSA SUSCEPTIBILITIES TO FOLLOW Performed at [facility], [place] [address], [place], [place] [address]    Report Status PENDING  Incomplete  Blood Culture (routine x 2)     Status: None (Preliminary result)   Collection Time: 09/27/21  6:53  AM   Specimen: BLOOD  Result Value Ref Range Status   Specimen Description   Final    BLOOD BLOOD LEFT FOREARM Performed at [facility] [address], [place], [place] [address]    Special Requests   Final    BOTTLES DRAWN AEROBIC AND ANAEROBIC Blood Culture results may not be optimal due to an inadequate volume of blood received in culture bottles Performed at [facility] [address], [place], [place] [address]    Culture   Final    NO GROWTH < 24 HOURS Performed at [facility] [address], [place], [place] [address]    Report Status PENDING  Incomplete  MRSA Next Gen by PCR, Nasal     Status: Abnormal   Collection Time: 09/27/21 12:34 PM   Specimen: Nasal Mucosa; Nasal Swab  Result Value Ref Range Status   MRSA by PCR Next Gen DETECTED (A) NOT DETECTED Final    Comment: RESULT CALLED TO, READ BACK BY AND VERIFIED WITH:  AMANDA WATSON RN 09/27/21 @ 9678 VS (NOTE) The GeneXpert MRSA Assay (FDA approved for NASAL specimens only), is one component of a comprehensive MRSA colonization surveillance program. It is not intended to diagnose MRSA infection nor to guide or monitor treatment for MRSA infections. Test performance is not FDA approved in patients less than 29 years old. Performed at [facility], [place] [address] [place], [place] [address]      RN Pressure Injury Documentation: Pressure Injury 09/27/21 Coccyx Medial Stage 2 -  Partial thickness loss of dermis presenting as a shallow open injury with a red, pink wound bed without slough. stage 2, 1.5X.6 (Active)  09/27/21 1300  Location: Coccyx  Location Orientation: Medial  Staging: Stage 2 -  Partial thickness loss of dermis presenting as a shallow open injury with a red, pink wound bed without slough.  Wound Description (Comments): stage 2, 1.5X.6  Present on Admission: Yes     Pressure Injury Buttocks Left;Anterior;Medial Deep Tissue Pressure  Injury - Purple or maroon localized area of discolored intact skin or blood-filled blister due to damage of underlying soft tissue from pressure and/or shear. 1.5X1 inner buttock beside s (Active)     Location: Buttocks  Location Orientation: Left;Anterior;Medial  Staging: Deep Tissue Pressure Injury - Purple or maroon localized area of discolored intact skin or blood-filled blister due to damage of underlying soft tissue from pressure and/or shear.  Wound Description (Comments): 1.5X1 inner buttock beside stage 2  Present on Admission: Yes    Estimated body mass index is 23.73 kg/m as calculated from the following:   Height as of this encounter: 6' (1.829 m).   Weight as of this encounter: 79.4 kg.  Malnutrition Type: Nutrition Problem: Severe Malnutrition Etiology: acute illness (fall on 9/26 with subsequent hemorrages and strokes) Malnutrition Characteristics: Signs/Symptoms: moderate fat depletion, moderate muscle depletion, severe muscle depletion, percent weight loss Percent weight loss: 15 % Nutrition Interventions: Interventions: Tube feeding, Prostat Radiology Studies: DG CHEST PORT 1 VIEW  Result Date: 09/29/2021 CLINICAL DATA:  Shortness of breath EXAM: PORTABLE CHEST 1 VIEW COMPARISON:  Previous studies including  the examination of 09/27/2021 FINDINGS: There is poor inspiration. Cardiac size is within normal limits. Central pulmonary vessels are more prominent. Increased interstitial markings are seen in the parahilar regions and lower lung fields, more so in the right lower lung fields. Left lateral CP angle is indistinct. There is no pneumothorax IMPRESSION: There is interval increase in interstitial markings in the mid and lower lung fields suggesting interstitial edema or interstitial pneumonitis. Part of this finding may be due to poor inspiration. Linear densities in the left mid and right lower lung fields suggest subsegmental atelectasis. Possible minimal left pleural  effusion. Electronically Signed   By: Elmer Picker M.D.   On: 09/29/2021 08:09    Scheduled Meds:  aspirin  81 mg Per Tube q AM   Chlorhexidine Gluconate Cloth  6 each Topical Daily   feeding supplement (PROSource TF)  45 mL Per Tube BID   free water  150 mL Per Tube Q4H   Gerhardt's butt cream   Topical BID   insulin aspart  0-9 Units Subcutaneous Q4H   mouth rinse  15 mL Mouth Rinse BID   mupirocin ointment  1 application Nasal BID   pantoprazole sodium  40 mg Per Tube Daily   simvastatin  40 mg Per Tube QPM   Continuous Infusions:  ampicillin-sulbactam (UNASYN) IV 3 g (09/29/21 0532)   feeding supplement (JEVITY 1.5 CAL/FIBER) 50 mL/hr at 09/29/21 0824    LOS: 1 day   Kerney Elbe, DO [facility] PAGER is on AMION  If 7PM-7AM, please contact night-coverage [email or web address]

## 2021-09-30 DIAGNOSIS — K219 Gastro-esophageal reflux disease without esophagitis: Secondary | ICD-10-CM | POA: Diagnosis not present

## 2021-09-30 DIAGNOSIS — T17908D Unspecified foreign body in respiratory tract, part unspecified causing other injury, subsequent encounter: Secondary | ICD-10-CM | POA: Diagnosis not present

## 2021-09-30 DIAGNOSIS — A419 Sepsis, unspecified organism: Secondary | ICD-10-CM | POA: Diagnosis not present

## 2021-09-30 DIAGNOSIS — E785 Hyperlipidemia, unspecified: Secondary | ICD-10-CM | POA: Diagnosis not present

## 2021-09-30 LAB — GLUCOSE, CAPILLARY
Glucose-Capillary: 103 mg/dL — ABNORMAL HIGH (ref 70–99)
Glucose-Capillary: 118 mg/dL — ABNORMAL HIGH (ref 70–99)
Glucose-Capillary: 131 mg/dL — ABNORMAL HIGH (ref 70–99)
Glucose-Capillary: 135 mg/dL — ABNORMAL HIGH (ref 70–99)
Glucose-Capillary: 137 mg/dL — ABNORMAL HIGH (ref 70–99)
Glucose-Capillary: 137 mg/dL — ABNORMAL HIGH (ref 70–99)
Glucose-Capillary: 138 mg/dL — ABNORMAL HIGH (ref 70–99)

## 2021-09-30 LAB — COMPREHENSIVE METABOLIC PANEL
ALT: 52 U/L — ABNORMAL HIGH (ref 0–44)
AST: 44 U/L — ABNORMAL HIGH (ref 15–41)
Albumin: 1.9 g/dL — ABNORMAL LOW (ref 3.5–5.0)
Alkaline Phosphatase: 133 U/L — ABNORMAL HIGH (ref 38–126)
Anion gap: 8 (ref 5–15)
BUN: 19 mg/dL (ref 8–23)
CO2: 23 mmol/L (ref 22–32)
Calcium: 7.5 mg/dL — ABNORMAL LOW (ref 8.9–10.3)
Chloride: 102 mmol/L (ref 98–111)
Creatinine, Ser: 0.41 mg/dL — ABNORMAL LOW (ref 0.61–1.24)
GFR, Estimated: >60 mL/min (ref >60)
Glucose, Bld: 129 mg/dL — ABNORMAL HIGH (ref 70–99)
Potassium: 3.5 mmol/L (ref 3.5–5.1)
Sodium: 133 mmol/L — ABNORMAL LOW (ref 135–145)
Total Bilirubin: 0.4 mg/dL (ref 0.3–1.2)
Total Protein: 5.3 g/dL — ABNORMAL LOW (ref 6.5–8.1)

## 2021-09-30 LAB — CBC WITH DIFFERENTIAL/PLATELET
Abs Immature Granulocytes: 0.08 10*3/uL — ABNORMAL HIGH (ref 0.00–0.07)
Basophils Absolute: 0 10*3/uL (ref 0.0–0.1)
Basophils Relative: 0 %
Eosinophils Absolute: 0.2 10*3/uL (ref 0.0–0.5)
Eosinophils Relative: 3 %
HCT: 32.6 % — ABNORMAL LOW (ref 39.0–52.0)
Hemoglobin: 10.5 g/dL — ABNORMAL LOW (ref 13.0–17.0)
Immature Granulocytes: 1 %
Lymphocytes Relative: 29 %
Lymphs Abs: 2 10*3/uL (ref 0.7–4.0)
MCH: 29 pg (ref 26.0–34.0)
MCHC: 32.2 g/dL (ref 30.0–36.0)
MCV: 90.1 fL (ref 80.0–100.0)
Monocytes Absolute: 0.7 10*3/uL (ref 0.1–1.0)
Monocytes Relative: 11 %
Neutro Abs: 3.8 10*3/uL (ref 1.7–7.7)
Neutrophils Relative %: 56 %
Platelets: 184 10*3/uL (ref 150–400)
RBC: 3.62 MIL/uL — ABNORMAL LOW (ref 4.22–5.81)
RDW: 14 % (ref 11.5–15.5)
WBC: 6.8 10*3/uL (ref 4.0–10.5)
nRBC: 0 % (ref 0.0–0.2)

## 2021-09-30 LAB — URINE CULTURE: Culture: >=100000 — AB

## 2021-09-30 LAB — MAGNESIUM: Magnesium: 1.9 mg/dL (ref 1.7–2.4)

## 2021-09-30 LAB — PHOSPHORUS: Phosphorus: 2.4 mg/dL — ABNORMAL LOW (ref 2.5–4.6)

## 2021-09-30 MED ORDER — SODIUM PHOSPHATES 45 MMOLE/15ML IV SOLN
15.0000 mmol | Freq: Once | INTRAVENOUS | Status: AC
  Administered 2021-09-30: 15 mmol via INTRAVENOUS
  Filled 2021-09-30: qty 5

## 2021-09-30 MED ORDER — POTASSIUM CHLORIDE 20 MEQ PO PACK
40.0000 meq | PACK | Freq: Two times a day (BID) | ORAL | Status: AC
  Administered 2021-09-30 (×2): 40 meq
  Filled 2021-09-30 (×2): qty 2

## 2021-09-30 MED ORDER — HYDROCOD POLST-CPM POLST ER 10-8 MG/5ML PO SUER
5.0000 mL | Freq: Two times a day (BID) | ORAL | Status: DC | PRN
  Administered 2021-09-30: 5 mL
  Filled 2021-09-30: qty 5

## 2021-09-30 NOTE — Progress Notes (Signed)
PROGRESS NOTE    Akul Leggette  ZOX:[id number] DOB: [birth year] DOA: 09/27/2021 PCP: Glendon Axe, MD  Brief Narrative:  The patient is a 70 year old Caucasian male with a past medical history significant for but not limited to GERD, hyperlipidemia, osteoarthritis, urolithiasis as well as other comorbidities who was recently admitted to [facility] and then [facility] after having a subdural hematoma, traumatic intraparenchymal hemorrhage, ventricular hemorrhage in the setting of acute head injury with occipital fracture, ischemic stroke followed by aspiration pneumonia in setting of stroke dysphagia status post PEG placement, transaminitis, AKI, acute urinary tension with indwelling Foley catheter was then transferred from his rehab facility yesterday morning after being found covered in vomit and febrile with 101.2 as well as being dyspneic and hypoxic in the 80s.  He responded to nonrebreather oxygen at 10L/min and then subsequently was brought to the ED.  At the facility he was started getting tube feedings around 11 PM and he has been bedbound since his stroke.  In the ED he is noted to be septic with a temperature of 101.2 and a pulse of 120 and respirations of 25.  He had a new O2 requirement and was placed on 3 L supplemental oxygen via nasal cannula.  His urinalysis was concerning for urinary tract infection and he had microscopic blood with 50 RBCs and 50 WBCs and many bacteria.  WBC was elevated at 15.3 and lactic acid was elevated.  He was also found to have some mildly abnormal LFTs.  Chest x-ray was done and showed low lung volumes without any evidence of acute cardiopulmonary disease.  He was admitted for sepsis secondary to likely aspiration pneumonia with concomitant UTI and he is improving.  Assessment & Plan:   Principal Problem:   Sepsis due to undetermined organism [facility]) Active Problems:   Lactic acidosis   GERD (gastroesophageal reflux disease)   Type 2 diabetes  mellitus with hyperglycemia (HCC)   Hyperlipidemia   Transaminitis   Moderate protein malnutrition (HCC)   Pressure injury of skin   Protein-calorie malnutrition, severe   Aspiration into airway  Severe sepsis secondary to likely aspiration pneumonia with concomitant catheter associated Klebsiella and Pseudomonas UTI, present on admission -He was placed in the stepdown unit -Met sepsis criteria on admission as he is febrile, tachypneic, tachycardic, had a leukocytosis as well as a source of infection as well as a new O2 requirement -He was given IV fluid hydration with LR at 150 MLS per hour which is now stopped after 20 hours -Lactic acid was elevated at 3.1 and then trended down to 1.8 with fluid resuscitation -Patient has a chronic Foley since his last admission and he was unable to have it discontinued at the last hospitalization; he presented with a Foley and so this was changed yesterday but did show signs of an infection -WBC has trended down from 15.3 -> 9.5 -> 6.3 -Blood cultures x2 show no growth to date less than 24 hours -He had a urinalysis done which showed a cloudy appearance with amber color urine, large hemoglobin, moderate leukocytes, many bacteria, greater than 50 RBCs per high-power field, greater than 50 WBCs and a urine culture that shows greater than 100,000 colony-forming unit of gram-negative rods and showing >100,000 of Klebsiella Pneumoniae and >100,000 of Pseudomonas Aeruginosa  with Sensitivities sensitive to IV Zosyn -Change antibiotics to IV Zosyn yesterday and will continue for now -We will obtain SLP evaluation and they did a modified barium swallow; after MBS they are recommending ice  chips as needed after oral care and free water protocol after oral care and recommending continue n.p.o. -Continue to follow cultures and continue Abx as above -Added Guaifenesin 5 mL every 4 hours to loosen cough and some Tussionex for cough suppression -Continue to monitor  temperature curve and WBC and follow-up on the cultures  Acute respiratory failure with hypoxia in the setting of aspiration from his vomit -Noted to have an O2 saturation in the 80s on arrival to the ED had to be placed on a nonrebreather -SpO2: 96 % O2 Flow Rate (L/min): 1 L/min -Now was weaned off of supplemental oxygen via nasal cannula -Continuous pulse oximetry maintain O2 saturation greater than 90% -Continue supplemental oxygen via nasal cath warranted and wean to room air as able -Repeat CXR this AM showed "There is interval increase in interstitial markings in the mid and lower lung fields suggesting interstitial edema or interstitial pneumonitis. Part of this finding may be due to poor inspiration. Linear densities in the left mid and right lower lung fields suggest subsegmental atelectasis. Possible minimal left pleural effusion." -We will add Tussionex -We will add Xopenex/Atrovent every 6 as needed for wheezing and shortness of breath  Pressure ulcer -Present on admission -Continue with local and preventive measures and will consult wound care for further evaluation -Continue with Gerhardt's Butt cream topically 2 times daily  GERD/GI prophylaxis -Continue with PPI with Lansoprazole 15 mg per tube  Diabetes mellitus type 2 with hyperglycemia -Continue with sensitive NovoLog sliding scale insulin every 4h -Continue to monitor blood sugars carefully; CBG's have been ranging from 88-115  Dysphagia in the setting of CVA -Continue tube feedings with Jevity at 60 mL/h continuous along with Prosource plus tube feedings 45 mL per tube twice daily and 150 mL free water flushes every 4h -Have consulted SLP for further evaluation and they are planning on MBS to be done   Normocytic Anemia -Patient's hemoglobin/hematocrit went from 13.6/42.6 and trended down to 10.9/33.8 -> 10.6/33.4 and is now 10.5/32.6 -Likely dilutional drop in the setting of IV fluid resuscitation -Check anemia  panel in the a.m. -Continue to monitor for signs and symptoms of bleeding; currently no overt bleeding noted -Repeat CBC in a.m.  Hypokalemia  -Mild. K+ went from 4.2 -> 3.4 and is now 3.5 -Replete with po Kcl 40 mEQ BID x2 via PEG again  -Continue to Monitor and Replete as Necessary -Repeat CMP in the AM   Hyponatremia -Mild. Na+ went from 136 -> 133 -Replete with Sodium Phos 15 mmol IV x1 -Continue to Monitor and Trend -Repeat CMP in the AM   Hypophosphatemia -Patient's Phos Level was 2.4 -Replete with IV Sodium Phos 15 mmol -Continue to Monitor and Trend -Repeat Phos Level in the AM  Hyperlipidemia  -Holding statin in the setting of his transaminitis and abnormal LFTs  Abnormal LFTs -Improved and likely in the setting of sepsis initially but now slightly worsened  -AST went from 46 and trended down to 33 -> 37 -> 44 -ALT went from 78 and trended down to 49 -> 46 -> 52 -Continue to monitor and trend hepatic function carefully and not improving worsening will consider a right upper quadrant ultrasound as well as an acute hepatitis panel -Repeat CMP in a.m.  Generalized weakness and deconditioning in the setting of his acute CVA and prolonged hospitalization -Has some residual left hemiplegia -Resume ASA and Atorvastatin  -We will consult PT and OT to further evaluate and treat and they are recommending Inpatient Rehab -  Rehabs Coordinator consulted and feel that he could be a potential candidate though Insurance Auth may be difficult; CIR pursuing potential   Severe malnutrition in the context of acute illness/injury -Nutritionist has been consulted for further evaluation recommendations -Have ordered the patient Jevity 1.5 x 30 mL/h to advance by 10 mils per hour every 8 hours to reach a goal rate of 60 mils per hour with 45 mL of Prosource TF twice daily and 150 mL free water flushes every 4 hours -They are recommending slow advancement due to concern for aspiration -SLP  consulted for MBS and recommending Ice Chips   DVT prophylaxis: SCDs; will consider pharmacological prophylaxis in the morning with enoxaparin Code Status: FULL CODE Family Communication: Discussed with wife at bedside  Disposition Plan: Pending further clinical improvement and evaluation by PT and OT as well as SLP; Potentially CIR for Rehab   Status is: Inpatient   The patient will require care spanning > 2 midnights and should be moved to inpatient because: Patient was meeting sepsis criteria and is improving however still not back to his baseline.  SLP is evaluating and planning for an MBS in the morning and PT OT will still need to see the patient for further evaluation and recommending Inpatient Rehab.   Consultants:  None  Procedures: None  Antimicrobials:  Anti-infectives (From admission, onward)    Start     Dose/Rate Route Frequency Ordered Stop   09/29/21 1200  piperacillin-tazobactam (ZOSYN) IVPB 3.375 g        3.375 g 12.5 mL/hr over 240 Minutes Intravenous Every 8 hours 09/29/21 1037     09/28/21 1200  Ampicillin-Sulbactam (UNASYN) 3 g in sodium chloride 0.9 % 100 mL IVPB  Status:  Discontinued        3 g 200 mL/hr over 30 Minutes Intravenous Every 6 hours 09/28/21 1012 09/29/21 1037   09/27/21 1700  ceFEPIme (MAXIPIME) 2 g in sodium chloride 0.9 % 100 mL IVPB  Status:  Discontinued        2 g 200 mL/hr over 30 Minutes Intravenous Every 8 hours 09/27/21 0848 09/28/21 1012   09/27/21 0815  ceFEPIme (MAXIPIME) 2 g in sodium chloride 0.9 % 100 mL IVPB        2 g 200 mL/hr over 30 Minutes Intravenous  Once 09/27/21 0800 09/27/21 0943   09/27/21 0645  cefTRIAXone (ROCEPHIN) 2 g in sodium chloride 0.9 % 100 mL IVPB  Status:  Discontinued        2 g 200 mL/hr over 30 Minutes Intravenous Every 24 hours 09/27/21 0642 09/27/21 0800   09/27/21 0645  azithromycin (ZITHROMAX) 500 mg in sodium chloride 0.9 % 250 mL IVPB  Status:  Discontinued        500 mg 250 mL/hr over 60  Minutes Intravenous Every 24 hours 09/27/21 3435 09/27/21 0801        Subjective: Seen and examined at bedside and he was doing ok and states he was coughing last night. No CP or SOB. Feels ok but has having discomfort where his pressure sore is. No lightheadedness or dizziness. No other concerns or complaints at this time.   Objective: Vitals:   09/30/21 0424 09/30/21 0500 09/30/21 0600 09/30/21 1000  BP: 139/63     Pulse: 90     Resp: _0 Temp: 98 F (36.7 C)     TempSrc: Oral     SpO2: 96%     Weight: 86.6 kg   80  kg  Height:        Intake/Output Summary (Last 24 hours) at 09/30/2021 1258 Last data filed at 09/30/2021 0700 Gross per 24 hour  Intake 797.66 ml  Output 1800 ml  Net -1002.34 ml    Filed Weights   09/27/21 0601 09/30/21 0424 09/30/21 1000  Weight: 79.4 kg 86.6 kg 80 kg   Examination: Physical Exam:  Constitutional: Chronically ill-appearing Caucasian male currently no acute distress appears calm Eyes: Lids and conjunctivae normal, sclerae anicteric  ENMT: External Ears, Nose appear normal. Grossly normal hearing. Mucous membranes are moist.  Neck: Appears normal, supple, no cervical masses, normal ROM, no appreciable thyromegaly; no appreciable JVD Respiratory: Diminished to auscultation bilaterally, no wheezing, rales, rhonchi or crackles. Normal respiratory effort and patient is not tachypenic. No accessory muscle use. Unlabored breathing  Cardiovascular: RRR, no murmurs / rubs / gallops. S1 and S2 auscultated. Mild Extremity edema   Abdomen: Soft, non-tender, non-distended.  Has a PEG tube in place in the mid abdomen bowel sounds positive.  GU: Deferred.  Foley catheter is in place Musculoskeletal: No clubbing / cyanosis of digits/nails. No joint deformity upper and lower extremities.  Skin: No rashes on limited skin evaluation but did not turn to view the pressure ulcer on his backside. No induration; Warm and dry.  Neurologic: CN 2-12 grossly  intact with no focal deficits.  Has left-sided weakness and some mild hemiplegia.  Romberg sign and cerebellar reflexes not assessed.  Psychiatric: Normal judgment and insight. Alert and oriented x 3. Normal mood and appropriate affect.   Data Reviewed: I have personally reviewed following labs and imaging studies  CBC: Recent Labs  Lab 09/27/21 0648 09/28/21 0305 09/29/21 0509 09/30/21 0552  WBC 15.3* 9.5 6.3 6.8  NEUTROABS 12.1* 6.1 3.8 3.8  HGB 13.6 10.9* 10.6* 10.5*  HCT 42.6 33.8* 33.4* 32.6*  MCV 90.6 90.4 89.3 90.1  PLT 280 171 188 741    Basic Metabolic Panel: Recent Labs  Lab 09/27/21 0648 09/28/21 0305 09/28/21 1309 09/28/21 1821 09/29/21 0509 09/29/21 1646 09/30/21 0552  NA 137 137  --   --  136  --  133*  K 4.4 4.2  --   --  3.4*  --  3.5  CL 105 107  --   --  105  --  102  CO2 23 24  --   --  25  --  23  GLUCOSE 161* 83  --   --  112*  --  129*  BUN 27* 20  --   --  21  --  19  CREATININE 0.91 0.60*  --   --  0.63  --  0.41*  CALCIUM 8.7* 8.3*  --   --  8.0*  --  7.5*  MG  --   --  2.1 1.9 1.9 1.9 1.9  PHOS  --   --  3.1 3.6 3.4 3.3 2.4*    GFR: Estimated Creatinine Clearance: 97 mL/min (A) (by C-G formula based on SCr of 0.41 mg/dL (L)). Liver Function Tests: Recent Labs  Lab 09/27/21 0648 09/28/21 0305 09/29/21 0509 09/30/21 0552  AST 46* 33 37 44*  ALT 78* 49* 46* 52*  ALKPHOS 160* 113 134* 133*  BILITOT 0.9 0.8 0.7 0.4  PROT 7.0 5.6* 5.6* 5.3*  ALBUMIN 2.6* 2.0* 2.1* 1.9*    No results for input(s): LIPASE, AMYLASE in the last 168 hours. No results for input(s): AMMONIA in the last 168 hours. Coagulation Profile: Recent Labs  Lab 09/27/21 0648  INR 1.1    Cardiac Enzymes: No results for input(s): CKTOTAL, CKMB, CKMBINDEX, TROPONINI in the last 168 hours. BNP (last 3 results) No results for input(s): PROBNP in the last 8760 hours. HbA1C: No results for input(s): HGBA1C in the last 72 hours. CBG: Recent Labs  Lab  09/29/21 2004 09/30/21 0000 09/30/21 0429 09/30/21 0758 09/30/21 1235  GLUCAP 134* 135* 137* 118* 137*    Lipid Profile: No results for input(s): CHOL, HDL, LDLCALC, TRIG, CHOLHDL, LDLDIRECT in the last 72 hours. Thyroid Function Tests: No results for input(s): TSH, T4TOTAL, FREET4, T3FREE, THYROIDAB in the last 72 hours. Anemia Panel: No results for input(s): VITAMINB12, FOLATE, FERRITIN, TIBC, IRON, RETICCTPCT in the last 72 hours. Sepsis Labs: Recent Labs  Lab 09/27/21 3300 09/27/21 0803 09/27/21 1000 09/27/21 1620  LATICACIDVEN 3.0* 3.1* 2.6* 1.8     Recent Results (from the past 240 hour(s))  Blood Culture (routine x 2)     Status: None (Preliminary result)   Collection Time: 09/27/21  6:47 AM   Specimen: BLOOD  Result Value Ref Range Status   Specimen Description   Final    BLOOD BLOOD RIGHT FOREARM Performed at [facility], [place] [address], [place], [place] [address]    Special Requests   Final    BOTTLES DRAWN AEROBIC AND ANAEROBIC Blood Culture adequate volume Performed at [place] [address], [place], [place] [address]    Culture   Final    NO GROWTH 3 DAYS Performed at [facility], [place] [address], [place], [place] [address]    Report Status PENDING  Incomplete  Resp Panel by RT-PCR (Flu A&B, Covid) Nasopharyngeal Swab     Status: None   Collection Time: 09/27/21  6:48 AM   Specimen: Nasopharyngeal Swab; Nasopharyngeal(NP) swabs in vial transport medium  Result Value Ref Range Status   SARS Coronavirus 2 by RT PCR NEGATIVE NEGATIVE Final    Comment: (NOTE) SARS-CoV-2 target nucleic acids are NOT DETECTED.  The SARS-CoV-2 RNA is generally detectable in upper respiratory specimens during the acute phase of infection. The lowest concentration of SARS-CoV-2 viral copies this assay can detect is 138 copies/mL. A negative result does not preclude SARS-Cov-2 infection and should not be used as the sole  basis for treatment or other patient management decisions. A negative result may occur with  improper specimen collection/handling, submission of specimen other than nasopharyngeal swab, presence of viral mutation(s) within the areas targeted by this assay, and inadequate number of viral copies(<138 copies/mL). A negative result must be combined with clinical observations, patient history, and epidemiological information. The expected result is Negative.  Fact Sheet for Patients:  EntrepreneurPulse.com.au  Fact Sheet for Healthcare Providers:  IncredibleEmployment.be  This test is no t yet approved or cleared by the [place] FDA and  has been authorized for detection and/or diagnosis of SARS-CoV-2 by FDA under an Emergency Use Authorization (EUA). This EUA will remain  in effect (meaning this test can be used) for the duration of the COVID-19 declaration under Section 564(b)(1) of the Act, 21 U.S.C.section 360bbb-3(b)(1), unless the authorization is terminated  or revoked sooner.       Influenza A by PCR NEGATIVE NEGATIVE Final   Influenza B by PCR NEGATIVE NEGATIVE Final    Comment: (NOTE) The Xpert Xpress SARS-CoV-2/FLU/RSV plus assay is intended as an aid in the diagnosis of influenza from Nasopharyngeal swab specimens and should not be used as a sole  basis for treatment. Nasal washings and aspirates are unacceptable for Xpert Xpress SARS-CoV-2/FLU/RSV testing.  Fact Sheet for Patients: EntrepreneurPulse.com.au  Fact Sheet for Healthcare Providers: IncredibleEmployment.be  This test is not yet approved or cleared by the [place] FDA and has been authorized for detection and/or diagnosis of SARS-CoV-2 by FDA under an Emergency Use Authorization (EUA). This EUA will remain in effect (meaning this test can be used) for the duration of the COVID-19 declaration under Section 564(b)(1) of the Act,  21 U.S.C. section 360bbb-3(b)(1), unless the authorization is terminated or revoked.  Performed at [facility], [place] [address] [place], [place] [address]   Urine Culture     Status: Abnormal   Collection Time: 09/27/21  6:48 AM   Specimen: In/Out Cath Urine  Result Value Ref Range Status   Specimen Description   Final    IN/OUT CATH URINE Performed at [place] [address] [place], [place] [address]    Special Requests   Final    NONE Performed at [facility], [place] [address] [place], [place] [address]    Culture (A)  Final    >=100,000 COLONIES/mL KLEBSIELLA PNEUMONIAE >=100,000 COLONIES/mL PSEUDOMONAS AERUGINOSA    Report Status 09/30/2021 FINAL  Final   Organism ID, Bacteria KLEBSIELLA PNEUMONIAE (A)  Final   Organism ID, Bacteria PSEUDOMONAS AERUGINOSA (A)  Final      Susceptibility   Klebsiella pneumoniae - MIC*    AMPICILLIN >=32 RESISTANT Resistant     CEFAZOLIN <=4 SENSITIVE Sensitive     CEFEPIME <=0.12 SENSITIVE Sensitive     CEFTRIAXONE <=0.25 SENSITIVE Sensitive     CIPROFLOXACIN <=0.25 SENSITIVE Sensitive     GENTAMICIN <=1 SENSITIVE Sensitive     IMIPENEM <=0.25 SENSITIVE Sensitive     NITROFURANTOIN 64 INTERMEDIATE Intermediate     TRIMETH/SULFA <=20 SENSITIVE Sensitive     AMPICILLIN/SULBACTAM 4 SENSITIVE Sensitive     PIP/TAZO <=4 SENSITIVE Sensitive     * >=100,000 COLONIES/mL KLEBSIELLA PNEUMONIAE   Pseudomonas aeruginosa - MIC*    CEFTAZIDIME 4 SENSITIVE Sensitive     CIPROFLOXACIN <=0.25 SENSITIVE Sensitive     GENTAMICIN 2 SENSITIVE Sensitive     IMIPENEM 2 SENSITIVE Sensitive     PIP/TAZO 8 SENSITIVE Sensitive     CEFEPIME 2 SENSITIVE Sensitive     * >=100,000 COLONIES/mL PSEUDOMONAS AERUGINOSA  Blood Culture (routine x 2)     Status: None (Preliminary result)   Collection Time: 09/27/21  6:53 AM   Specimen: BLOOD  Result Value Ref Range Status   Specimen Description   Final     BLOOD BLOOD LEFT FOREARM Performed at [facility], [place] [address], [place], [place] [address]    Special Requests   Final    BOTTLES DRAWN AEROBIC AND ANAEROBIC Blood Culture results may not be optimal due to an inadequate volume of blood received in culture bottles Performed at [place] [address], [place], [place] [address]    Culture   Final    NO GROWTH 3 DAYS Performed at [facility], [place] [address], [place], [place] [address]    Report Status PENDING  Incomplete  MRSA Next Gen by PCR, Nasal     Status: Abnormal   Collection Time: 09/27/21 12:34 PM   Specimen: Nasal Mucosa; Nasal Swab  Result Value Ref Range Status   MRSA by PCR Next Gen DETECTED (A) NOT DETECTED Final    Comment: RESULT  CALLED TO, READ BACK BY AND VERIFIED WITH:  Marliss Czar RN 09/27/21 @ 2956 VS (NOTE) The GeneXpert MRSA Assay (FDA approved for NASAL specimens only), is one component of a comprehensive MRSA colonization surveillance program. It is not intended to diagnose MRSA infection nor to guide or monitor treatment for MRSA infections. Test performance is not FDA approved in patients less than 50 years old. Performed at [facility], [place] [address], [place], [place] [address]      RN Pressure Injury Documentation: Pressure Injury 09/27/21 Coccyx Medial Stage 2 -  Partial thickness loss of dermis presenting as a shallow open injury with a red, pink wound bed without slough. stage 2, 1.5X.6 (Active)  09/27/21 1300  Location: Coccyx  Location Orientation: Medial  Staging: Stage 2 -  Partial thickness loss of dermis presenting as a shallow open injury with a red, pink wound bed without slough.  Wound Description (Comments): stage 2, 1.5X.6  Present on Admission: Yes     Pressure Injury Buttocks Left;Anterior;Medial Deep Tissue Pressure Injury - Purple or maroon localized area of discolored intact skin or blood-filled blister  due to damage of underlying soft tissue from pressure and/or shear. 1.5X1 inner buttock beside s (Active)     Location: Buttocks  Location Orientation: Left;Anterior;Medial  Staging: Deep Tissue Pressure Injury - Purple or maroon localized area of discolored intact skin or blood-filled blister due to damage of underlying soft tissue from pressure and/or shear.  Wound Description (Comments): 1.5X1 inner buttock beside stage 2  Present on Admission: Yes    Estimated body mass index is 23.92 kg/m as calculated from the following:   Height as of this encounter: 6' (1.829 m).   Weight as of this encounter: 80 kg.  Malnutrition Type: Nutrition Problem: Severe Malnutrition Etiology: acute illness (fall on 9/26 with subsequent hemorrages and strokes) Malnutrition Characteristics: Signs/Symptoms: moderate fat depletion, moderate muscle depletion, severe muscle depletion, percent weight loss Percent weight loss: 15 % Nutrition Interventions: Interventions: Tube feeding, Prostat Radiology Studies: DG CHEST PORT 1 VIEW  Result Date: 09/29/2021 CLINICAL DATA:  Shortness of breath EXAM: PORTABLE CHEST 1 VIEW COMPARISON:  Previous studies including the examination of 09/27/2021 FINDINGS: There is poor inspiration. Cardiac size is within normal limits. Central pulmonary vessels are more prominent. Increased interstitial markings are seen in the parahilar regions and lower lung fields, more so in the right lower lung fields. Left lateral CP angle is indistinct. There is no pneumothorax IMPRESSION: There is interval increase in interstitial markings in the mid and lower lung fields suggesting interstitial edema or interstitial pneumonitis. Part of this finding may be due to poor inspiration. Linear densities in the left mid and right lower lung fields suggest subsegmental atelectasis. Possible minimal left pleural effusion. Electronically Signed   By: Elmer Picker M.D.   On: 09/29/2021 08:09   DG  Swallowing Func-Speech Pathology  Result Date: 09/29/2021 Table formatting from the original result was not included. Objective Swallowing Evaluation: Type of Study: MBS-Modified Barium Swallow Study  Patient Details Name: Twain Stenseth MRN: [id number] Date of Birth: [birth year] Today's Date: 09/29/2021 Time: SLP Start Time (ACUTE ONLY): 1245 -SLP Stop Time (ACUTE ONLY): 1315 SLP Time Calculation (min) (ACUTE ONLY): 30 min Past Medical History: Past Medical History: Diagnosis Date  GERD (gastroesophageal reflux disease)   Hyperlipidemia   Joint pain   Kidney stone  Past Surgical History: Past Surgical History: Procedure Laterality Date  LITHOTRIPSY   HPI: Corvin Sorbo is a 69 y.o. male with  medical history significant for GERD hyperlipidemia, osteoarthritis, urolithiasis who was recently admitted to [facility] (9/26-29) after falling, hitting his head on concrete and sustaining a large right frontal intraparenchymal hemorrhage, large intraventricular hemorrhage, SDH and right occipital bone fx. PEG 10/27.  D/Cd home, 9/30 admitted to [facility] then D/Cd  to SNF for rehab on 11/1.  Pt was transferred from his facility after being there for less than 24 hours to [facility] after being found covered in vomit, febrile at 101.2 F,, tachypneic/dyspneic and hypoxic in the 80s.  Per Md notes, "he responded to [facility] oxygen at 10 LPM then subsequently brought to the emergency department.  At the facility, he was started getting tube feeding around 2300 last night.  He has been bedbound.  A significant portion of the history is taken from his wife, but he was able to say a few things and answer simple questions."  Swallow eval ordered.  Pt has hx of dysphagia diagnosed initially 10/4 and mech soft diet ordered, mentation waxed/waned and pt was made npo with Dobhoff.  MBS then conducted 10/19 and 11/1 - recommendation was npo x frazier water protocol.  Large barrier to adequate po intake has been his mentation.    Recs from 11/1 MBS were npo and ice  chips with aggressive SLP at next venue of care.  11/3 clinical swallow evaluation recommended NPO except ice chips and repeat MBS now that pt is more alert.  Subjective: alert Assessment / Plan / Recommendation CHL IP CLINICAL IMPRESSIONS 09/29/2021 Clinical Impression Pt's swallow function appears to have improved since 11/1 MBS according to review of documentation from [facility].  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  Oral phase is only mildly impacted.  Primary issues are related to reduced base of tongue thrust and reduced pharyngeal squeeze, leading to incomplete epiglottic inversion beyond the horizontal and consistent residue in the valleculae and pyriform sinuses.  Residue was greater with heavier material (puree, cracker) and reduced with thin and nectar liquids - however, the liquids had a greater tendency to spill from pyriforms into the larynx AFTER the swallow response.  Mobility of the larynx was improved. Nectars tended to penetrate the larynx and rest on the vocal folds (trace amounts). Thin liquids were aspirated - also in trace amounts - and immediately evoked a cough response. Chin tucks and head turns did not appear to provide much help in airway protection or clearance. Recommend continuing the ice chip/water protocol after oral care. Initiate trials of nectar liquids with SLP only and resume therapeutic exercise while in acute care.  Agree that pt may benefit greatly from CIR for rehab. SLP Visit Diagnosis Dysphagia, oropharyngeal phase (R13.12) Attention and concentration deficit following -- Frontal lobe and executive function deficit following -- Impact on safety and function Moderate aspiration risk   CHL IP TREATMENT RECOMMENDATION 09/29/2021 Treatment Recommendations Therapy as outlined in treatment plan below   Prognosis 09/29/2021 Prognosis for Safe Diet Advancement Good Barriers to Reach Goals -- Barriers/Prognosis Comment -- CHL IP DIET RECOMMENDATION 09/29/2021  SLP Diet Recommendations Ice chips PRN after oral care;Free water protocol after oral care;NPO Liquid Administration via Spoon Medication Administration Via alternative means Compensations Minimize environmental distractions Postural Changes --   CHL IP OTHER RECOMMENDATIONS 09/29/2021 Recommended Consults -- Oral Care Recommendations Oral care QID Other Recommendations --   CHL IP FOLLOW UP RECOMMENDATIONS 09/29/2021 Follow up Recommendations Inpatient Rehab   CHL IP FREQUENCY AND DURATION 09/29/2021 Speech Therapy Frequency (ACUTE ONLY) min 2x/week  Treatment Duration 2 weeks      CHL IP ORAL PHASE 09/29/2021 Oral Phase Impaired Oral - Pudding Teaspoon -- Oral - Pudding Cup -- Oral - Honey Teaspoon -- Oral - Honey Cup -- Oral - Nectar Teaspoon -- Oral - Nectar Cup -- Oral - Nectar Straw -- Oral - Thin Teaspoon -- Oral - Thin Cup -- Oral - Thin Straw -- Oral - Puree WFL Oral - Mech Soft -- Oral - Regular Decreased bolus cohesion Oral - Multi-Consistency -- Oral - Pill -- Oral Phase - Comment --  CHL IP PHARYNGEAL PHASE 09/29/2021 Pharyngeal Phase Impaired Pharyngeal- Pudding Teaspoon -- Pharyngeal -- Pharyngeal- Pudding Cup -- Pharyngeal -- Pharyngeal- Honey Teaspoon -- Pharyngeal -- Pharyngeal- Honey Cup -- Pharyngeal -- Pharyngeal- Nectar Teaspoon -- Pharyngeal -- Pharyngeal- Nectar Cup Delayed swallow initiation-vallecula;Reduced pharyngeal peristalsis;Reduced epiglottic inversion;Reduced airway/laryngeal closure;Reduced tongue base retraction;Penetration/Apiration after swallow;Pharyngeal residue - valleculae;Pharyngeal residue - pyriform Pharyngeal Material enters airway, CONTACTS cords and not ejected out Pharyngeal- Nectar Straw Delayed swallow initiation-vallecula;Reduced pharyngeal peristalsis;Reduced epiglottic inversion;Reduced airway/laryngeal closure;Penetration/Apiration after swallow;Pharyngeal residue - valleculae;Pharyngeal residue - pyriform Pharyngeal Material enters airway, CONTACTS cords and not  ejected out Pharyngeal- Thin Teaspoon -- Pharyngeal -- Pharyngeal- Thin Cup Delayed swallow initiation-vallecula;Reduced pharyngeal peristalsis;Reduced epiglottic inversion;Reduced airway/laryngeal closure;Penetration/Aspiration during swallow;Penetration/Apiration after swallow;Trace aspiration;Pharyngeal residue - valleculae;Pharyngeal residue - pyriform Pharyngeal Material enters airway, passes BELOW cords and not ejected out despite cough attempt by patient Pharyngeal- Thin Straw Delayed swallow initiation-vallecula;Reduced pharyngeal peristalsis;Reduced airway/laryngeal closure;Reduced tongue base retraction;Penetration/Aspiration during swallow;Penetration/Apiration after swallow;Trace aspiration;Pharyngeal residue - valleculae;Pharyngeal residue - pyriform Pharyngeal Material enters airway, passes BELOW cords and not ejected out despite cough attempt by patient Pharyngeal- Puree Delayed swallow initiation-vallecula;Reduced pharyngeal peristalsis;Reduced epiglottic inversion;Reduced tongue base retraction;Pharyngeal residue - valleculae;Pharyngeal residue - pyriform Pharyngeal -- Pharyngeal- Mechanical Soft -- Pharyngeal -- Pharyngeal- Regular Delayed swallow initiation-vallecula;Reduced pharyngeal peristalsis;Reduced epiglottic inversion;Pharyngeal residue - valleculae;Pharyngeal residue - pyriform Pharyngeal -- Pharyngeal- Multi-consistency -- Pharyngeal -- Pharyngeal- Pill -- Pharyngeal -- Pharyngeal Comment --  No flowsheet data found. Juan Quam Laurice 09/29/2021, 4:29 PM               Scheduled Meds:  aspirin  81 mg Per Tube q AM   Chlorhexidine Gluconate Cloth  6 each Topical Daily   feeding supplement (PROSource TF)  45 mL Per Tube BID   free water  150 mL Per Tube Q4H   Gerhardt's butt cream   Topical BID   insulin aspart  0-9 Units Subcutaneous Q4H   mouth rinse  15 mL Mouth Rinse BID   mupirocin ointment  1 application Nasal BID   pantoprazole sodium  40 mg Per Tube Daily    simvastatin  40 mg Per Tube QPM   Continuous Infusions:  feeding supplement (JEVITY 1.5 CAL/FIBER) 60 mL/hr at 09/29/21 1830   piperacillin-tazobactam 3.375 g (09/30/21 1255)   sodium phosphate  Dextrose 5% IVPB 15 mmol (09/30/21 1004)    LOS: 2 days   Kerney Elbe, DO [facility] PAGER is on AMION  If 7PM-7AM, please contact night-coverage [email or web address]

## 2021-09-30 NOTE — Progress Notes (Addendum)
Inpatient Rehab Admissions Coordinator:  Consult received. Called pt at [facility] and got no answer. Also called pt's wife, [name] and got no answer. Left a message; awaiting return call. Will continue to follow.  ADDENDUM:  Received return call from pt's wife, [name]. Explained CIR goals and expectation. She acknowledged understanding. She is interested in pt pursuing CIR. She confirmed that she can provide 24/7 support and pt has good support from other family members, neighbors, and friends. Will continue to follow.    Wolfgang Phoenix, MS, CCC-SLP Admissions Coordinator [phone]

## 2021-10-01 DIAGNOSIS — T17908D Unspecified foreign body in respiratory tract, part unspecified causing other injury, subsequent encounter: Secondary | ICD-10-CM | POA: Diagnosis not present

## 2021-10-01 DIAGNOSIS — A419 Sepsis, unspecified organism: Secondary | ICD-10-CM | POA: Diagnosis not present

## 2021-10-01 DIAGNOSIS — K219 Gastro-esophageal reflux disease without esophagitis: Secondary | ICD-10-CM | POA: Diagnosis not present

## 2021-10-01 DIAGNOSIS — R197 Diarrhea, unspecified: Secondary | ICD-10-CM

## 2021-10-01 DIAGNOSIS — E785 Hyperlipidemia, unspecified: Secondary | ICD-10-CM | POA: Diagnosis not present

## 2021-10-01 LAB — COMPREHENSIVE METABOLIC PANEL
ALT: 62 U/L — ABNORMAL HIGH (ref 0–44)
AST: 47 U/L — ABNORMAL HIGH (ref 15–41)
Albumin: 2 g/dL — ABNORMAL LOW (ref 3.5–5.0)
Alkaline Phosphatase: 141 U/L — ABNORMAL HIGH (ref 38–126)
Anion gap: 9 (ref 5–15)
BUN: 17 mg/dL (ref 8–23)
CO2: 25 mmol/L (ref 22–32)
Calcium: 8.1 mg/dL — ABNORMAL LOW (ref 8.9–10.3)
Chloride: 101 mmol/L (ref 98–111)
Creatinine, Ser: 0.59 mg/dL — ABNORMAL LOW (ref 0.61–1.24)
GFR, Estimated: >60 mL/min (ref >60)
Glucose, Bld: 131 mg/dL — ABNORMAL HIGH (ref 70–99)
Potassium: 4.1 mmol/L (ref 3.5–5.1)
Sodium: 135 mmol/L (ref 135–145)
Total Bilirubin: 0.4 mg/dL (ref 0.3–1.2)
Total Protein: 5.8 g/dL — ABNORMAL LOW (ref 6.5–8.1)

## 2021-10-01 LAB — CBC WITH DIFFERENTIAL/PLATELET
Abs Immature Granulocytes: 0.18 10*3/uL — ABNORMAL HIGH (ref 0.00–0.07)
Basophils Absolute: 0 10*3/uL (ref 0.0–0.1)
Basophils Relative: 1 %
Eosinophils Absolute: 0.2 10*3/uL (ref 0.0–0.5)
Eosinophils Relative: 2 %
HCT: 37 % — ABNORMAL LOW (ref 39.0–52.0)
Hemoglobin: 11.7 g/dL — ABNORMAL LOW (ref 13.0–17.0)
Immature Granulocytes: 3 %
Lymphocytes Relative: 33 %
Lymphs Abs: 2.4 10*3/uL (ref 0.7–4.0)
MCH: 28.3 pg (ref 26.0–34.0)
MCHC: 31.6 g/dL (ref 30.0–36.0)
MCV: 89.6 fL (ref 80.0–100.0)
Monocytes Absolute: 0.7 10*3/uL (ref 0.1–1.0)
Monocytes Relative: 10 %
Neutro Abs: 3.7 10*3/uL (ref 1.7–7.7)
Neutrophils Relative %: 51 %
Platelets: 222 10*3/uL (ref 150–400)
RBC: 4.13 MIL/uL — ABNORMAL LOW (ref 4.22–5.81)
RDW: 14 % (ref 11.5–15.5)
WBC: 7.1 10*3/uL (ref 4.0–10.5)
nRBC: 0 % (ref 0.0–0.2)

## 2021-10-01 LAB — GLUCOSE, CAPILLARY
Glucose-Capillary: 129 mg/dL — ABNORMAL HIGH (ref 70–99)
Glucose-Capillary: 144 mg/dL — ABNORMAL HIGH (ref 70–99)
Glucose-Capillary: 128 mg/dL — ABNORMAL HIGH (ref 70–99)
Glucose-Capillary: 112 mg/dL — ABNORMAL HIGH (ref 70–99)
Glucose-Capillary: 133 mg/dL — ABNORMAL HIGH (ref 70–99)

## 2021-10-01 LAB — PHOSPHORUS: Phosphorus: 2.3 mg/dL — ABNORMAL LOW (ref 2.5–4.6)

## 2021-10-01 LAB — MAGNESIUM: Magnesium: 2.1 mg/dL (ref 1.7–2.4)

## 2021-10-01 MED ORDER — OXYCODONE HCL 5 MG PO TABS
5.0000 mg | ORAL_TABLET | Freq: Four times a day (QID) | ORAL | Status: DC | PRN
  Filled 2021-10-01: qty 1

## 2021-10-01 MED ORDER — DICLOFENAC SODIUM 1 % EX GEL
2.0000 g | Freq: Four times a day (QID) | CUTANEOUS | Status: DC
  Administered 2021-10-01 – 2021-10-10 (×32): 2 g via TOPICAL
  Filled 2021-10-01: qty 100

## 2021-10-01 MED ORDER — POTASSIUM PHOSPHATES 15 MMOLE/5ML IV SOLN
20.0000 mmol | Freq: Once | INTRAVENOUS | Status: AC
  Administered 2021-10-01: 20 mmol via INTRAVENOUS
  Filled 2021-10-01: qty 6.67

## 2021-10-01 MED ORDER — SODIUM CHLORIDE 0.9 % IV BOLUS
500.0000 mL | Freq: Once | INTRAVENOUS | Status: AC
  Administered 2021-10-01: 500 mL via INTRAVENOUS

## 2021-10-01 MED ORDER — OXYCODONE HCL 5 MG PO TABS
5.0000 mg | ORAL_TABLET | Freq: Four times a day (QID) | ORAL | Status: DC | PRN
  Administered 2021-10-01 – 2021-10-10 (×9): 5 mg
  Filled 2021-10-01 (×11): qty 1

## 2021-10-01 NOTE — Progress Notes (Signed)
PROGRESS NOTE    Raymond Stuart  KXF:[id number] DOB: [birth year] DOA: 09/27/2021 PCP: Glendon Axe, MD  Brief Narrative:  The patient is a 70 year old Caucasian male with a past medical history significant for but not limited to GERD, hyperlipidemia, osteoarthritis, urolithiasis as well as other comorbidities who was recently admitted to [facility] and then [facility] after having a subdural hematoma, traumatic intraparenchymal hemorrhage, ventricular hemorrhage in the setting of acute head injury with occipital fracture, ischemic stroke followed by aspiration pneumonia in setting of stroke dysphagia status post PEG placement, transaminitis, AKI, acute urinary tension with indwelling Foley catheter was then transferred from his rehab facility yesterday morning after being found covered in vomit and febrile with 101.2 as well as being dyspneic and hypoxic in the 80s.  He responded to nonrebreather oxygen at 10L/min and then subsequently was brought to the ED.  At the facility he was started getting tube feedings around 11 PM and he has been bedbound since his stroke.  In the ED he is noted to be septic with a temperature of 101.2 and a pulse of 120 and respirations of 25.  He had a new O2 requirement and was placed on 3 L supplemental oxygen via nasal cannula.  His urinalysis was concerning for urinary tract infection and he had microscopic blood with 50 RBCs and 50 WBCs and many bacteria.  WBC was elevated at 15.3 and lactic acid was elevated.  He was also found to have some mildly abnormal LFTs.  Chest x-ray was done and showed low lung volumes without any evidence of acute cardiopulmonary disease.  He was admitted for sepsis secondary to likely aspiration pneumonia with concomitant UTI and he is improving.  Overnight he had mulitple bowel movements and diarrhea but he is Afebrile, has no Leukocytosis, and is on TF and Abx. Will need to continue to Monitor and if continues to worsen and if he  spikes a temperature and worsens WBC will work up for Infectious Diarrhea.   Assessment & Plan:   Principal Problem:   Sepsis due to undetermined organism [facility]) Active Problems:   Lactic acidosis   GERD (gastroesophageal reflux disease)   Type 2 diabetes mellitus with hyperglycemia (HCC)   Hyperlipidemia   Transaminitis   Moderate protein malnutrition (HCC)   Pressure injury of skin   Protein-calorie malnutrition, severe   Aspiration into airway  Severe sepsis secondary to likely aspiration pneumonia with concomitant catheter associated Klebsiella and Pseudomonas UTI, present on admission -He was placed in the stepdown unit -Met sepsis criteria on admission as he is febrile, tachypneic, tachycardic, had a leukocytosis as well as a source of infection as well as a new O2 requirement -He was given IV fluid hydration with LR at 150 MLS per hour which is now stopped after 20 hours -Lactic acid was elevated at 3.1 and then trended down to 1.8 with fluid resuscitation -Patient has a chronic Foley since his last admission and he was unable to have it discontinued at the last hospitalization; he presented with a Foley and so this was changed yesterday but did show signs of an infection -WBC has trended down from 15.3 -> 9.5 -> 6.3 -> 7.1 -Blood cultures x2 show no growth to date at 3 Days  -He had a urinalysis done which showed a cloudy appearance with amber color urine, large hemoglobin, moderate leukocytes, many bacteria, greater than 50 RBCs per high-power field, greater than 50 WBCs and a urine culture that shows greater than 100,000 colony-forming  unit of gram-negative rods and showing >100,000 of Klebsiella Pneumoniae and >100,000 of Pseudomonas Aeruginosa  with Sensitivities sensitive to IV Zosyn -Change antibiotics to IV Zosyn 09/29/21 and will continue for now -We will obtain SLP evaluation and they did a modified barium swallow; after MBS they are recommending ice chips as needed after  oral care and free water protocol after oral care and recommending continue n.p.o. -Continue to follow cultures and continue Abx as above -Added Guaifenesin 5 mL every 4 hours to loosen cough and some Tussionex for cough suppression -Continue to monitor temperature curve and WBC and follow-up on the cultures  Acute respiratory failure with hypoxia in the setting of aspiration from his vomit -Noted to have an O2 saturation in the 80s on arrival to the ED had to be placed on a nonrebreather -SpO2: 93 % O2 Flow Rate (L/min): 1 L/min -Now was weaned off of supplemental oxygen via nasal cannula -Continuous pulse oximetry maintain O2 saturation greater than 90% -Continue supplemental oxygen via nasal cath warranted and wean to room air as able -Repeat CXR this AM showed "There is interval increase in interstitial markings in the mid and lower lung fields suggesting interstitial edema or interstitial pneumonitis. Part of this finding may be due to poor inspiration. Linear densities in the left mid and right lower lung fields suggest subsegmental atelectasis. Possible minimal left pleural effusion." -We will add Tussionex as above  -We will add Xopenex/Atrovent every 6 as needed for wheezing and shortness of breath  Diarrhea -In the setting of Tube Feedings and Abx with IV Zosyn  -Has had multiple bowel movements overnight -Continue to Monitor and will try Anti-Diarrheals and consider Infectious Workup if continues to Worsen   Pressure ulcer -Present on admission -Continue with local and preventive measures and will consult wound care for further evaluation -Continue with Gerhardt's Butt cream topically 2 times daily  GERD/GI prophylaxis -Continue with PPI with Lansoprazole 15 mg per tube  Diabetes mellitus type 2 with hyperglycemia -Continue with sensitive NovoLog sliding scale insulin every 4h -Continue to monitor blood sugars carefully; CBG's have been ranging from 103-144  Dysphagia in  the setting of CVA -Continue tube feedings with Jevity at 60 mL/h continuous along with Prosource plus tube feedings 45 mL per tube twice daily and 150 mL free water flushes every 4h -Have consulted SLP for further evaluation and MBS done  Normocytic Anemia -Patient's hemoglobin/hematocrit went from 13.6/42.6 and trended down to 10.9/33.8 -> 10.6/33.4 -> 10.5/32.6 -> 11.7/37.0 -Likely dilutional drop in the setting of IV fluid resuscitation but now IVF stopped -Check anemia panel in the a.m. -Continue to monitor for signs and symptoms of bleeding; currently no overt bleeding noted -Repeat CBC in a.m.  Hypokalemia  -Mild. K+ went from 4.2 -> 3.4 -> 3.5 -> 4.1 -Replete with po Kcl 40 mEQ BID x2 via PEG again  -Continue to Monitor and Replete as Necessary -Repeat CMP in the AM   Hyponatremia -Mild. Na+ went from 136 -> 133 -> 135 -Replete with Sodium Phos 15 mmol IV x1 yesterday  -Continue to Monitor and Trend -Repeat CMP in the AM   Hypophosphatemia -Patient's Phos Level was 2.3 -Replete with IV Sodium Phos 15 mmol yesterday and will replete with IV K Phos 20 mmol today  -Continue to Monitor and Trend -Repeat Phos Level in the AM  Urinary Retention and ? Neurogenic Bladder -Had a foley placed last hopsitalizaion and was unable to have it removed due to retention -May discuss with  Urology here and Try TOV  Hyperlipidemia  -Holding statin in the setting of his transaminitis and abnormal LFTs  Abnormal LFTs -Improved and likely in the setting of sepsis initially but now slightly worsened  -AST went from 46 and trended down to 33 -> 37 -> 44 -> 47 -ALT went from 78 and trended down to 49 -> 46 -> 52 -> 62 -Continue to monitor and trend hepatic function carefully and not improving worsening will consider a right upper quadrant ultrasound as well as an acute hepatitis panel -Repeat CMP in a.m.  Generalized weakness and deconditioning in the setting of his acute CVA and prolonged  hospitalization -Has some residual left hemiplegia -Resume ASA and Atorvastatin  -We will consult PT and OT to further evaluate and treat and they are recommending Inpatient Rehab -Rehabs Coordinator consulted and feel that he could be a potential candidate though Insurance Auth may be difficult; CIR pursuing potential   Hypoalbuminemia -T Bili went from 1.9 -> 2.0 -Continue to Monitor and trend -Repeat CMP in the AM  Severe malnutrition in the context of acute illness/injury -Nutritionist has been consulted for further evaluation recommendations -Have ordered the patient Jevity 1.5 x 30 mL/h to advance by 10 mils per hour every 8 hours to reach a goal rate of 60 mils per hour with 45 mL of Prosource TF twice daily and 150 mL free water flushes every 4 hours -They are recommending slow advancement due to concern for aspiration -SLP consulted for MBS and recommending Ice Chips   DVT prophylaxis: SCDs; will consider pharmacological prophylaxis in the morning with enoxaparin Code Status: FULL CODE Family Communication: Discussed with wife at bedside  Disposition Plan: Pending further clinical improvement and evaluation by PT and OT as well as SLP; Potentially CIR for Rehab   Status is: Inpatient   The patient will require care spanning > 2 midnights and should be moved to inpatient because: Patient was meeting sepsis criteria and is improving however still not back to his baseline.  SLP is evaluating and planning for an MBS in the morning and PT OT will still need to see the patient for further evaluation and recommending Inpatient Rehab.   Consultants:  None  Procedures: None  Antimicrobials:  Anti-infectives (From admission, onward)    Start     Dose/Rate Route Frequency Ordered Stop   09/29/21 1200  piperacillin-tazobactam (ZOSYN) IVPB 3.375 g        3.375 g 12.5 mL/hr over 240 Minutes Intravenous Every 8 hours 09/29/21 1037     09/28/21 1200  Ampicillin-Sulbactam (UNASYN) 3 g  in sodium chloride 0.9 % 100 mL IVPB  Status:  Discontinued        3 g 200 mL/hr over 30 Minutes Intravenous Every 6 hours 09/28/21 1012 09/29/21 1037   09/27/21 1700  ceFEPIme (MAXIPIME) 2 g in sodium chloride 0.9 % 100 mL IVPB  Status:  Discontinued        2 g 200 mL/hr over 30 Minutes Intravenous Every 8 hours 09/27/21 0848 09/28/21 1012   09/27/21 0815  ceFEPIme (MAXIPIME) 2 g in sodium chloride 0.9 % 100 mL IVPB        2 g 200 mL/hr over 30 Minutes Intravenous  Once 09/27/21 0800 09/27/21 0943   09/27/21 0645  cefTRIAXone (ROCEPHIN) 2 g in sodium chloride 0.9 % 100 mL IVPB  Status:  Discontinued        2 g 200 mL/hr over 30 Minutes Intravenous Every 24 hours 09/27/21 0383  09/27/21 0800   09/27/21 0645  azithromycin (ZITHROMAX) 500 mg in sodium chloride 0.9 % 250 mL IVPB  Status:  Discontinued        500 mg 250 mL/hr over 60 Minutes Intravenous Every 24 hours 09/27/21 3149 09/27/21 0801        Subjective: Seen and examined at bedside and and he denied any complaints and states that he has improvement of his coughing with the Tussionex.  States that his main issue is now having diarrhea he had multiple bowel movements overnight.  No chest pain or shortness of breath.  No other concerns or complaints at the time  Objective: Vitals:   09/30/21 2300 10/01/21 0000 10/01/21 0447 10/01/21 0521  BP:   114/75   Pulse:   88   Resp: _0 Temp:   (!) 97.5 F (36.4 C)   TempSrc:   Oral   SpO2:   93%   Weight:    87 kg  Height:        Intake/Output Summary (Last 24 hours) at 10/01/2021 7026 Last data filed at 10/01/2021 0700 Gross per 24 hour  Intake 2742.34 ml  Output 3850 ml  Net -1107.66 ml    Filed Weights   09/30/21 0424 09/30/21 1000 10/01/21 0521  Weight: 86.6 kg 80 kg 87 kg   Examination:  Physical Exam:  Constitutional: Chronically ill-appearing Caucasian male currently no acute distress appears calm  Eyes: Lids and conjunctivae normal, sclerae anicteric   ENMT: External Ears, Nose appear normal. Grossly normal hearing. Mucous membranes are moist. Neck: Appears normal, supple, no cervical masses, normal ROM, no appreciable thyromegaly; no appreciable JVD Respiratory: Diminished to auscultation bilaterally with coarse breath sounds, no wheezing, rales, rhonchi or crackles. Normal respiratory effort and patient is not tachypenic. No accessory muscle use.  Unlabored breathing Cardiovascular: RRR, no murmurs / rubs / gallops. S1 and S2 auscultated.  Some trace extremity edema Abdomen: Soft, non-tender, distended secondary body habitus..  Has a PEG tube in place in the mid abdomen.  Bowel sounds positive.  GU: Deferred. Musculoskeletal: No clubbing / cyanosis of digits/nails. No joint deformity upper and lower extremities. Skin: No rashes, lesions on limited skin evaluation and did not turn review his pressure ulcer on the backside no induration; Warm and dry.  Neurologic: CN 2-12 grossly intact with no focal deficits. Romberg sign and cerebellar reflexes not assessed.  Has some left-sided weakness and some mild Psychiatric: Normal judgment and insight. Alert and oriented x 3. Normal mood and appropriate affect.   Data Reviewed: I have personally reviewed following labs and imaging studies  CBC: Recent Labs  Lab 09/27/21 0648 09/28/21 0305 09/29/21 0509 09/30/21 0552 10/01/21 0608  WBC 15.3* 9.5 6.3 6.8 7.1  NEUTROABS 12.1* 6.1 3.8 3.8 3.7  HGB 13.6 10.9* 10.6* 10.5* 11.7*  HCT 42.6 33.8* 33.4* 32.6* 37.0*  MCV 90.6 90.4 89.3 90.1 89.6  PLT 280 171 188 184 378    Basic Metabolic Panel: Recent Labs  Lab 09/27/21 0648 09/28/21 0305 09/28/21 1309 09/28/21 1821 09/29/21 0509 09/29/21 1646 09/30/21 0552 10/01/21 0608  NA 137 137  --   --  136  --  133* 135  K 4.4 4.2  --   --  3.4*  --  3.5 4.1  CL 105 107  --   --  105  --  102 101  CO2 23 24  --   --  25  --  23 25  GLUCOSE 161* 83  --   --  112*  --  129* 131*  BUN 27* 20  --    --  21  --  19 17  CREATININE 0.91 0.60*  --   --  0.63  --  0.41* 0.59*  CALCIUM 8.7* 8.3*  --   --  8.0*  --  7.5* 8.1*  MG  --   --    < > 1.9 1.9 1.9 1.9 2.1  PHOS  --   --    < > 3.6 3.4 3.3 2.4* 2.3*   < > = values in this interval not displayed.    GFR: Estimated Creatinine Clearance: 97 mL/min (A) (by C-G formula based on SCr of 0.59 mg/dL (L)). Liver Function Tests: Recent Labs  Lab 09/27/21 0648 09/28/21 0305 09/29/21 0509 09/30/21 0552 10/01/21 0608  AST 46* 33 37 44* 47*  ALT 78* 49* 46* 52* 62*  ALKPHOS 160* 113 134* 133* 141*  BILITOT 0.9 0.8 0.7 0.4 0.4  PROT 7.0 5.6* 5.6* 5.3* 5.8*  ALBUMIN 2.6* 2.0* 2.1* 1.9* 2.0*    No results for input(s): LIPASE, AMYLASE in the last 168 hours. No results for input(s): AMMONIA in the last 168 hours. Coagulation Profile: Recent Labs  Lab 09/27/21 0648  INR 1.1    Cardiac Enzymes: No results for input(s): CKTOTAL, CKMB, CKMBINDEX, TROPONINI in the last 168 hours. BNP (last 3 results) No results for input(s): PROBNP in the last 8760 hours. HbA1C: No results for input(s): HGBA1C in the last 72 hours. CBG: Recent Labs  Lab 09/30/21 1626 09/30/21 1945 09/30/21 2348 10/01/21 0449 10/01/21 0725  GLUCAP 103* 131* 138* 129* 144*    Lipid Profile: No results for input(s): CHOL, HDL, LDLCALC, TRIG, CHOLHDL, LDLDIRECT in the last 72 hours. Thyroid Function Tests: No results for input(s): TSH, T4TOTAL, FREET4, T3FREE, THYROIDAB in the last 72 hours. Anemia Panel: No results for input(s): VITAMINB12, FOLATE, FERRITIN, TIBC, IRON, RETICCTPCT in the last 72 hours. Sepsis Labs: Recent Labs  Lab 09/27/21 4536 09/27/21 0803 09/27/21 1000 09/27/21 1620  LATICACIDVEN 3.0* 3.1* 2.6* 1.8     Recent Results (from the past 240 hour(s))  Blood Culture (routine x 2)     Status: None (Preliminary result)   Collection Time: 09/27/21  6:47 AM   Specimen: BLOOD  Result Value Ref Range Status   Specimen Description   Final     BLOOD BLOOD RIGHT FOREARM Performed at [facility], [place] [address], [place], [place] [address]    Special Requests   Final    BOTTLES DRAWN AEROBIC AND ANAEROBIC Blood Culture adequate volume Performed at [place] [address], [place], [place] [address]    Culture   Final    NO GROWTH 3 DAYS Performed at [facility], [place] [address] [place], [place] [address]    Report Status PENDING  Incomplete  Resp Panel by RT-PCR (Flu A&B, Covid) Nasopharyngeal Swab     Status: None   Collection Time: 09/27/21  6:48 AM   Specimen: Nasopharyngeal Swab; Nasopharyngeal(NP) swabs in vial transport medium  Result Value Ref Range Status   SARS Coronavirus 2 by RT PCR NEGATIVE NEGATIVE Final    Comment: (NOTE) SARS-CoV-2 target nucleic acids are NOT DETECTED.  The SARS-CoV-2 RNA is generally detectable in upper respiratory specimens during the acute phase of infection. The lowest concentration of SARS-CoV-2 viral copies this assay can detect is 138 copies/mL. A negative result does not preclude SARS-Cov-2 infection and should not be used  as the sole basis for treatment or other patient management decisions. A negative result may occur with  improper specimen collection/handling, submission of specimen other than nasopharyngeal swab, presence of viral mutation(s) within the areas targeted by this assay, and inadequate number of viral copies(<138 copies/mL). A negative result must be combined with clinical observations, patient history, and epidemiological information. The expected result is Negative.  Fact Sheet for Patients:  EntrepreneurPulse.com.au  Fact Sheet for Healthcare Providers:  IncredibleEmployment.be  This test is no t yet approved or cleared by the [place] FDA and  has been authorized for detection and/or diagnosis of SARS-CoV-2 by FDA under an Emergency Use Authorization (EUA). This  EUA will remain  in effect (meaning this test can be used) for the duration of the COVID-19 declaration under Section 564(b)(1) of the Act, 21 U.S.C.section 360bbb-3(b)(1), unless the authorization is terminated  or revoked sooner.       Influenza A by PCR NEGATIVE NEGATIVE Final   Influenza B by PCR NEGATIVE NEGATIVE Final    Comment: (NOTE) The Xpert Xpress SARS-CoV-2/FLU/RSV plus assay is intended as an aid in the diagnosis of influenza from Nasopharyngeal swab specimens and should not be used as a sole basis for treatment. Nasal washings and aspirates are unacceptable for Xpert Xpress SARS-CoV-2/FLU/RSV testing.  Fact Sheet for Patients: EntrepreneurPulse.com.au  Fact Sheet for Healthcare Providers: IncredibleEmployment.be  This test is not yet approved or cleared by the [place] FDA and has been authorized for detection and/or diagnosis of SARS-CoV-2 by FDA under an Emergency Use Authorization (EUA). This EUA will remain in effect (meaning this test can be used) for the duration of the COVID-19 declaration under Section 564(b)(1) of the Act, 21 U.S.C. section 360bbb-3(b)(1), unless the authorization is terminated or revoked.  Performed at [facility], [place] [address] [place], [place] [address]   Urine Culture     Status: Abnormal   Collection Time: 09/27/21  6:48 AM   Specimen: In/Out Cath Urine  Result Value Ref Range Status   Specimen Description   Final    IN/OUT CATH URINE Performed at [facility] [address], [place], [place] [address]    Special Requests   Final    NONE Performed at [facility], [place] [address], [place], [place] [address]    Culture (A)  Final    >=100,000 COLONIES/mL KLEBSIELLA PNEUMONIAE >=100,000 COLONIES/mL PSEUDOMONAS AERUGINOSA    Report Status 09/30/2021 FINAL  Final   Organism ID, Bacteria KLEBSIELLA PNEUMONIAE (A)  Final    Organism ID, Bacteria PSEUDOMONAS AERUGINOSA (A)  Final      Susceptibility   Klebsiella pneumoniae - MIC*    AMPICILLIN >=32 RESISTANT Resistant     CEFAZOLIN <=4 SENSITIVE Sensitive     CEFEPIME <=0.12 SENSITIVE Sensitive     CEFTRIAXONE <=0.25 SENSITIVE Sensitive     CIPROFLOXACIN <=0.25 SENSITIVE Sensitive     GENTAMICIN <=1 SENSITIVE Sensitive     IMIPENEM <=0.25 SENSITIVE Sensitive     NITROFURANTOIN 64 INTERMEDIATE Intermediate     TRIMETH/SULFA <=20 SENSITIVE Sensitive     AMPICILLIN/SULBACTAM 4 SENSITIVE Sensitive     PIP/TAZO <=4 SENSITIVE Sensitive     * >=100,000 COLONIES/mL KLEBSIELLA PNEUMONIAE   Pseudomonas aeruginosa - MIC*    CEFTAZIDIME 4 SENSITIVE Sensitive     CIPROFLOXACIN <=0.25 SENSITIVE Sensitive     GENTAMICIN 2 SENSITIVE Sensitive     IMIPENEM 2 SENSITIVE Sensitive     PIP/TAZO 8 SENSITIVE Sensitive  CEFEPIME 2 SENSITIVE Sensitive     * >=100,000 COLONIES/mL PSEUDOMONAS AERUGINOSA  Blood Culture (routine x 2)     Status: None (Preliminary result)   Collection Time: 09/27/21  6:53 AM   Specimen: BLOOD  Result Value Ref Range Status   Specimen Description   Final    BLOOD BLOOD LEFT FOREARM Performed at [place] [address] [place], [place] [address]    Special Requests   Final    BOTTLES DRAWN AEROBIC AND ANAEROBIC Blood Culture results may not be optimal due to an inadequate volume of blood received in culture bottles Performed at [place] [address] [place], [place] [address]    Culture   Final    NO GROWTH 3 DAYS Performed at [facility], [place] [address], [place], [place] [address]    Report Status PENDING  Incomplete  MRSA Next Gen by PCR, Nasal     Status: Abnormal   Collection Time: 09/27/21 12:34 PM   Specimen: Nasal Mucosa; Nasal Swab  Result Value Ref Range Status   MRSA by PCR Next Gen DETECTED (A) NOT DETECTED Final    Comment: RESULT CALLED TO, READ BACK BY AND VERIFIED  WITH:  AMANDA WATSON RN 09/27/21 @ 9379 VS (NOTE) The GeneXpert MRSA Assay (FDA approved for NASAL specimens only), is one component of a comprehensive MRSA colonization surveillance program. It is not intended to diagnose MRSA infection nor to guide or monitor treatment for MRSA infections. Test performance is not FDA approved in patients less than 33 years old. Performed at [facility], [place] [address], [place], [place] [address]      RN Pressure Injury Documentation: Pressure Injury 09/27/21 Coccyx Medial Stage 2 -  Partial thickness loss of dermis presenting as a shallow open injury with a red, pink wound bed without slough. stage 2, 1.5X.6 (Active)  09/27/21 1300  Location: Coccyx  Location Orientation: Medial  Staging: Stage 2 -  Partial thickness loss of dermis presenting as a shallow open injury with a red, pink wound bed without slough.  Wound Description (Comments): stage 2, 1.5X.6  Present on Admission: Yes     Pressure Injury Buttocks Left;Anterior;Medial Deep Tissue Pressure Injury - Purple or maroon localized area of discolored intact skin or blood-filled blister due to damage of underlying soft tissue from pressure and/or shear. 1.5X1 inner buttock beside s (Active)     Location: Buttocks  Location Orientation: Left;Anterior;Medial  Staging: Deep Tissue Pressure Injury - Purple or maroon localized area of discolored intact skin or blood-filled blister due to damage of underlying soft tissue from pressure and/or shear.  Wound Description (Comments): 1.5X1 inner buttock beside stage 2  Present on Admission: Yes    Estimated body mass index is 26.01 kg/m as calculated from the following:   Height as of this encounter: 6' (1.829 m).   Weight as of this encounter: 87 kg.  Malnutrition Type: Nutrition Problem: Severe Malnutrition Etiology: acute illness (fall on 9/26 with subsequent hemorrages and strokes) Malnutrition Characteristics: Signs/Symptoms:  moderate fat depletion, moderate muscle depletion, severe muscle depletion, percent weight loss Percent weight loss: 15 % Nutrition Interventions: Interventions: Tube feeding, Prostat Radiology Studies: DG Swallowing Func-Speech Pathology  Result Date: 09/29/2021 Table formatting from the original result was not included. Objective Swallowing Evaluation: Type of Study: MBS-Modified Barium Swallow Study  Patient Details Name: Raymond Stuart MRN: [id number] Date of Birth: 1951/06/13 Today's Date: 09/29/2021 Time: SLP Start Time (ACUTE ONLY): 2683 -  SLP Stop Time (ACUTE ONLY): 1315 SLP Time Calculation (min) (ACUTE ONLY): 30 min Past Medical History: Past Medical History: Diagnosis Date  GERD (gastroesophageal reflux disease)   Hyperlipidemia   Joint pain   Kidney stone  Past Surgical History: Past Surgical History: Procedure Laterality Date  LITHOTRIPSY   HPI: Gleason Ardoin is a 70 y.o. male with medical history significant for GERD hyperlipidemia, osteoarthritis, urolithiasis who was recently admitted to [facility] (9/26-29) after falling, hitting his head on concrete and sustaining a large right frontal intraparenchymal hemorrhage, large intraventricular hemorrhage, SDH and right occipital bone fx. PEG 10/27.  D/Cd home, 9/30 admitted to [facility] then D/Cd  to SNF for rehab on 11/1.  Pt was transferred from his facility after being there for less than 24 hours to [facility] after being found covered in vomit, febrile at 101.2 F,, tachypneic/dyspneic and hypoxic in the 80s.  Per Md notes, "he responded to [facility] oxygen at 10 LPM then subsequently brought to the emergency department.  At the facility, he was started getting tube feeding around 2300 last night.  He has been bedbound.  A significant portion of the history is taken from his wife, but he was able to say a few things and answer simple questions."  Swallow eval ordered.  Pt has hx of dysphagia diagnosed initially 10/4 and mech soft diet ordered, mentation waxed/waned and pt  was made npo with Dobhoff.  MBS then conducted 10/19 and 11/1 - recommendation was npo x frazier water protocol.  Large barrier to adequate po intake has been his mentation.    Recs from 11/1 MBS were npo and ice chips with aggressive SLP at next venue of care.  11/3 clinical swallow evaluation recommended NPO except ice chips and repeat MBS now that pt is more alert.  Subjective: alert Assessment / Plan / Recommendation CHL IP CLINICAL IMPRESSIONS 09/29/2021 Clinical Impression Pt's swallow function appears to have improved since 11/1 MBS according to review of documentation from [facility].  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  Oral phase is only mildly impacted.  Primary issues are related to reduced base of tongue thrust and reduced pharyngeal squeeze, leading to incomplete epiglottic inversion beyond the horizontal and consistent residue in the valleculae and pyriform sinuses.  Residue was greater with heavier material (puree, cracker) and reduced with thin and nectar liquids - however, the liquids had a greater tendency to spill from pyriforms into the larynx AFTER the swallow response.  Mobility of the larynx was improved. Nectars tended to penetrate the larynx and rest on the vocal folds (trace amounts). Thin liquids were aspirated - also in trace amounts - and immediately evoked a cough response. Chin tucks and head turns did not appear to provide much help in airway protection or clearance. Recommend continuing the ice chip/water protocol after oral care. Initiate trials of nectar liquids with SLP only and resume therapeutic exercise while in acute care.  Agree that pt may benefit greatly from CIR for rehab. SLP Visit Diagnosis Dysphagia, oropharyngeal phase (R13.12) Attention and concentration deficit following -- Frontal lobe and executive function deficit following -- Impact on safety and function Moderate aspiration risk   CHL IP TREATMENT RECOMMENDATION 09/29/2021 Treatment  Recommendations Therapy as outlined in treatment plan below   Prognosis 09/29/2021 Prognosis for Safe Diet Advancement Good Barriers to Reach Goals -- Barriers/Prognosis Comment -- CHL IP DIET RECOMMENDATION 09/29/2021 SLP Diet Recommendations Ice chips PRN after oral care;Free water protocol after oral care;NPO Liquid Administration  via Spoon Medication Administration Via alternative means Compensations Minimize environmental distractions Postural Changes --   CHL IP OTHER RECOMMENDATIONS 09/29/2021 Recommended Consults -- Oral Care Recommendations Oral care QID Other Recommendations --   CHL IP FOLLOW UP RECOMMENDATIONS 09/29/2021 Follow up Recommendations Inpatient Rehab   CHL IP FREQUENCY AND DURATION 09/29/2021 Speech Therapy Frequency (ACUTE ONLY) min 2x/week Treatment Duration 2 weeks      CHL IP ORAL PHASE 09/29/2021 Oral Phase Impaired Oral - Pudding Teaspoon -- Oral - Pudding Cup -- Oral - Honey Teaspoon -- Oral - Honey Cup -- Oral - Nectar Teaspoon -- Oral - Nectar Cup -- Oral - Nectar Straw -- Oral - Thin Teaspoon -- Oral - Thin Cup -- Oral - Thin Straw -- Oral - Puree WFL Oral - Mech Soft -- Oral - Regular Decreased bolus cohesion Oral - Multi-Consistency -- Oral - Pill -- Oral Phase - Comment --  CHL IP PHARYNGEAL PHASE 09/29/2021 Pharyngeal Phase Impaired Pharyngeal- Pudding Teaspoon -- Pharyngeal -- Pharyngeal- Pudding Cup -- Pharyngeal -- Pharyngeal- Honey Teaspoon -- Pharyngeal -- Pharyngeal- Honey Cup -- Pharyngeal -- Pharyngeal- Nectar Teaspoon -- Pharyngeal -- Pharyngeal- Nectar Cup Delayed swallow initiation-vallecula;Reduced pharyngeal peristalsis;Reduced epiglottic inversion;Reduced airway/laryngeal closure;Reduced tongue base retraction;Penetration/Apiration after swallow;Pharyngeal residue - valleculae;Pharyngeal residue - pyriform Pharyngeal Material enters airway, CONTACTS cords and not ejected out Pharyngeal- Nectar Straw Delayed swallow initiation-vallecula;Reduced pharyngeal  peristalsis;Reduced epiglottic inversion;Reduced airway/laryngeal closure;Penetration/Apiration after swallow;Pharyngeal residue - valleculae;Pharyngeal residue - pyriform Pharyngeal Material enters airway, CONTACTS cords and not ejected out Pharyngeal- Thin Teaspoon -- Pharyngeal -- Pharyngeal- Thin Cup Delayed swallow initiation-vallecula;Reduced pharyngeal peristalsis;Reduced epiglottic inversion;Reduced airway/laryngeal closure;Penetration/Aspiration during swallow;Penetration/Apiration after swallow;Trace aspiration;Pharyngeal residue - valleculae;Pharyngeal residue - pyriform Pharyngeal Material enters airway, passes BELOW cords and not ejected out despite cough attempt by patient Pharyngeal- Thin Straw Delayed swallow initiation-vallecula;Reduced pharyngeal peristalsis;Reduced airway/laryngeal closure;Reduced tongue base retraction;Penetration/Aspiration during swallow;Penetration/Apiration after swallow;Trace aspiration;Pharyngeal residue - valleculae;Pharyngeal residue - pyriform Pharyngeal Material enters airway, passes BELOW cords and not ejected out despite cough attempt by patient Pharyngeal- Puree Delayed swallow initiation-vallecula;Reduced pharyngeal peristalsis;Reduced epiglottic inversion;Reduced tongue base retraction;Pharyngeal residue - valleculae;Pharyngeal residue - pyriform Pharyngeal -- Pharyngeal- Mechanical Soft -- Pharyngeal -- Pharyngeal- Regular Delayed swallow initiation-vallecula;Reduced pharyngeal peristalsis;Reduced epiglottic inversion;Pharyngeal residue - valleculae;Pharyngeal residue - pyriform Pharyngeal -- Pharyngeal- Multi-consistency -- Pharyngeal -- Pharyngeal- Pill -- Pharyngeal -- Pharyngeal Comment --  No flowsheet data found. Juan Quam Laurice 09/29/2021, 4:29 PM               Scheduled Meds:  aspirin  81 mg Per Tube q AM   Chlorhexidine Gluconate Cloth  6 each Topical Daily   feeding supplement (PROSource TF)  45 mL Per Tube BID   free water  150 mL Per Tube  Q4H   Gerhardt's butt cream   Topical BID   insulin aspart  0-9 Units Subcutaneous Q4H   mouth rinse  15 mL Mouth Rinse BID   mupirocin ointment  1 application Nasal BID   pantoprazole sodium  40 mg Per Tube Daily   simvastatin  40 mg Per Tube QPM   Continuous Infusions:  feeding supplement (JEVITY 1.5 CAL/FIBER) 60 mL/hr at 09/29/21 1830   piperacillin-tazobactam 3.375 g (10/01/21 0500)    LOS: 3 days   Kerney Elbe, DO [facility] PAGER is on AMION  If 7PM-7AM, please contact night-coverage [email or web address]

## 2021-10-01 NOTE — Progress Notes (Signed)
Occupational Therapy Treatment Patient Details Name: Raymond Stuart MRN: [id number] DOB: [birth year] Today's Date: 10/01/2021   History of present illness 70 y.o. male admitted for sepsis secondary likely aspiration pnuemonia with UTI. PMH significant for GERD, OA, recently admitted to [facility] for Subdural hematoma, intaparyenchemal hemorrhage, ventricular hemorrhage, ischemic stroke.   OT comments  Patient supine in bed when therapist entered the room. His affect is flatter than on evaluation with less talking and engagement during session. Patient appears to be discouraged from frequent episodes of diarrhea and his overall debility. Patient total assist x 1 to transfer to side of bed with second person min guard for safety. Patient limited by significant pain in left hip and knee guarding and not assisting with transfer. Once in sitting patient reports "I'm gonna pass out" and then he was quickly returned to supine. Dynamap had to be located and then once BP taken in supine was 108/63. Returned to 127/83. Therapist provided PROM to left hip and knee to promote mobility at joint and reduce pain. Patient's LLE positioning tending to stay externally rotated with knee not fully extended. Patient performed exercises in bed to promote strength and motor control in left upper and lower extremity. LLE positioned with towel rolls to promote neutral positioning. Patient and family educated throughout on aspects of treatment. Continue to recommend short term rehab.    Recommendations for follow up therapy are one component of a multi-disciplinary discharge planning process, led by the attending physician.  Recommendations may be updated based on patient status, additional functional criteria and insurance authorization.    Follow Up Recommendations  Acute inpatient rehab (3hours/day)    Assistance Recommended at Discharge    Equipment Recommendations   (TBD)    Recommendations for Other Services       Precautions / Restrictions Precautions Precautions: Fall Precaution Comments: g-tube RLQ, catheter, orthostatic, left hip pain Restrictions Weight Bearing Restrictions: No       Mobility Bed Mobility Overal bed mobility: Needs Assistance Bed Mobility: Rolling;Supine to Sit     Supine to sit: Max assist     General bed mobility comments: Rolling to left to assess if patient had bowel movement  - max assist x 1 in flat position. Total assist x 1 to transfer in to sitting with second person there just for safety. Patient limited by left hip and knee pain. once seated at head of bed patient stated "I'm gonna pass out" and Returned to supine with total assist ( x 2).    Transfers                         Balance Overall balance assessment: Needs assistance Sitting-balance support: No upper extremity supported;Feet unsupported Sitting balance-Leahy Scale: Fair                                     ADL either performed or assessed with clinical judgement   ADL                                               Vision Patient Visual Report: Blurring of vision Vision Assessment?: Vision impaired- to be further tested in functional context   Perception     Praxis      Cognition Arousal/Alertness: Awake/alert  Behavior During Therapy: Flat affect Overall Cognitive Status: Within Functional Limits for tasks assessed                                            Exercises Other Exercises Other Exercises: PROM Left hip flexion/knee flexion Other Exercises: Shoulder flexion with elbow extension x 10, shoulder external rotation, in hand manipulation task with each hand.   Shoulder Instructions       General Comments      Pertinent Vitals/ Pain       Pain Assessment: PAINAD Breathing: normal Negative Vocalization: repeated troubled calling out, loud moaning/groaning, crying Facial Expression: facial grimacing Body  Language: tense, distressed pacing, fidgeting Consolability: distracted or reassured by voice/touch PAINAD Score: 6 Pain Location: Left hip and left knee Pain Descriptors / Indicators: Aching;Sharp;Grimacing;Crying;Guarding Pain Intervention(s): Limited activity within patient's tolerance;Monitored during session;Repositioned;Relaxation;Premedicated before session  Home Living                                          Prior Functioning/Environment              Frequency  Min 2X/week        Progress Toward Goals  OT Goals(current goals can now be found in the care plan section)  Progress towards OT goals: OT to reassess next treatment (limited by pain and BP today)  Acute Rehab OT Goals OT Goal Formulation: With patient Time For Goal Achievement: 10/13/21 Potential to Achieve Goals: Good  Plan Discharge plan remains appropriate    Co-evaluation                 AM-PAC OT "6 Clicks" Daily Activity     Outcome Measure   Help from another person eating meals?: A Little Help from another person taking care of personal grooming?: A Lot Help from another person toileting, which includes using toliet, bedpan, or urinal?: Total Help from another person bathing (including washing, rinsing, drying)?: A Lot Help from another person to put on and taking off regular upper body clothing?: A Lot Help from another person to put on and taking off regular lower body clothing?: Total 6 Click Score: 11    End of Session    OT Visit Diagnosis: Unsteadiness on feet (R26.81);Other abnormalities of gait and mobility (R26.89);Muscle weakness (generalized) (M62.81);History of falling (Z91.81);Pain;Hemiplegia and hemiparesis Hemiplegia - Right/Left: Left Hemiplegia - dominant/non-dominant: Non-Dominant Hemiplegia - caused by: Cerebral infarction Pain - Right/Left: Left Pain - part of body: Hip;Knee   Activity Tolerance Patient limited by pain   Patient Left in  bed;with call bell/phone within reach;with bed alarm set;with family/visitor present   Nurse Communication  (okay to see)        Time: 1104-1140 OT Time Calculation (min): 36 min  Charges: OT General Charges $OT Visit: 1 Visit OT Treatments $Therapeutic Activity: 8-22 mins $Therapeutic Exercise: 8-22 mins  Mahlik Lenn, OTR/L Acute Care Rehab Services  Office [phone] Pager: [phone]   Kelli Churn 10/01/2021, 1:11 PM

## 2021-10-01 NOTE — PMR Pre-admission (Signed)
PMR Admission Coordinator Pre-Admission Assessment  Patient: Raymond Stuart is an 69 y.o., male MRN: [id number] DOB: 10/23/1951 Height: 6' (182.9 cm) Weight: 81.9 kg  Insurance Information HMO: yes    PPO:      PCP:      IPA:      80/20:      OTHER:  PRIMARY: [employer] Medicare      Policy#: [id number]      Subscriber: patient CM Name: Elizabeth      Phone#: [phone]     Fax#: [phone] Pre-Cert#: [id number] auth for CIR provided by Elizabeth after expedited appeal with health plan.  Updates due to fax listed above on 11/18.      Employer:  Benefits:  Phone #: [email or web address]     Name:  Eff. Date: 11/26/20-11/25/21  Deduct: $0 (does not have deductible)    Out of Pocket Max: $4,500 ($2,186.94 met) Life Max: NA CIR: $325/day co-pay for days 1-5, 100% coverage days 6+      SNF: 100% coverage days 1-20, $188/day co-pay days 21-44, 100% coverage Outpatient: $30/visit co-pay     Co-Pay:  Home Health: 100% coverage      Co-Pay:  DME: 80% coverage     Co-Pay: 20% co-insurance Providers: in-network SECONDARY:       Policy#:      Phone#:   Financial Counselor:       Phone#:   The "Data Collection Information Summary" for patients in Inpatient Rehabilitation Facilities with attached "Privacy Act Statement-Health Care Records" was provided and verbally reviewed with: Family  Emergency Contact Information Contact Information     Name Relation Home Work Mobile   Reindel,Nancy Spouse   [phone]       Current Medical History  Patient Admitting Diagnosis: debility d/t sepsis secondary to aspiration PNA with UTI History of Present Illness: Pt is a 69 y/o male admitted to [facility] on 11/2 with AMS and SOB.  Admitted from SNF where he was receiving therapy services for SDH, traumatic IPH, ventricular hemorrhage in the setting of acute head injury with occipital fracture, and ischemic stroke.  Pt with PNA 2/2 dysphagia from stroke.  PEG placed at OSH.  In ED pt febrile at 101.2,  tachypneic/dyspneic and hypoxic in the 80s.  Placed on NRB at 10L.  Pulse 120.  BP 122/85.  Hospital course management of acute hypoxic respiratory failure 2/2 aspiration pneumonia and klebsiella/pseudomonas UTI.  He completed abx course.  Therapy evaluations were completed and pt was recommended for CIR.   .  PMH significant for OA, urolithiasis.      Patient's medical record from [facility] has been reviewed by the rehabilitation admission coordinator and physician.  Past Medical History  Past Medical History:  Diagnosis Date   GERD (gastroesophageal reflux disease)    Hyperlipidemia    Joint pain    Kidney stone     Has the patient had major surgery during 100 days prior to admission? No  Family History   family history includes Hypertension in his mother.  Current Medications  Current Facility-Administered Medications:    acetaminophen (TYLENOL) 160 MG/5ML solution 650 mg, 650 mg, Per Tube, Q6H PRN, Sheikh, Omair Latif, DO, 650 mg at 10/09/21 1400   alum & mag hydroxide-simeth (MAALOX/MYLANTA) 200-200-20 MG/5ML suspension 15 mL, 15 mL, Oral, Once, Kakrakandy, Arshad N, MD   aspirin chewable tablet 81 mg, 81 mg, Per Tube, q AM, Sheikh, Omair Latif, DO, 81 mg at 10/09/21 1051   Chlorhexidine Gluconate Cloth 2 %   PADS 6 each, 6 each, Topical, Daily, Ortiz, David Manuel, MD, 6 each at 10/09/21 1054   chlorpheniramine-HYDROcodone (TUSSIONEX) 10-8 MG/5ML suspension 5 mL, 5 mL, Per Tube, Q12H PRN, Sheikh, Omair Latif, DO, 5 mL at 09/30/21 2011   diclofenac Sodium (VOLTAREN) 1 % topical gel 2 g, 2 g, Topical, QID, Sheikh, Omair Latif, DO, 2 g at 10/09/21 1401   enoxaparin (LOVENOX) injection 40 mg, 40 mg, Subcutaneous, Q24H, Xu, Fang, MD, 40 mg at 10/08/21 1754   feeding supplement (JEVITY 1.2 CAL) liquid 1,000 mL, 1,000 mL, Per Tube, Continuous, Xu, Fang, MD, Last Rate: 70 mL/hr at 10/09/21 0612, 1,000 mL at 10/09/21 0612   feeding supplement (PROSource TF) liquid 45 mL, 45 mL,  Per Tube, TID, Xu, Fang, MD, 45 mL at 10/09/21 1620   free water 150 mL, 150 mL, Per Tube, Q4H, Sheikh, Omair Latif, DO, 150 mL at 10/09/21 1620   Gerhardt's butt cream, , Topical, BID, Sheikh, Omair Latif, DO, Given at 10/09/21 1053   guaiFENesin (ROBITUSSIN) 100 MG/5ML liquid 5 mL, 5 mL, Per Tube, Q4H PRN, Sheikh, Omair Latif, DO, 5 mL at 09/29/21 1833   hydrocortisone (ANUSOL-HC) suppository 25 mg, 25 mg, Rectal, BID, Xu, Fang, MD, 25 mg at 10/09/21 1052   insulin aspart (novoLOG) injection 0-9 Units, 0-9 Units, Subcutaneous, Q4H, Sheikh, Omair Latif, DO, 1 Units at 10/09/21 1620   ipratropium (ATROVENT) nebulizer solution 0.5 mg, 0.5 mg, Nebulization, Q6H PRN, Sheikh, Omair Latif, DO   levalbuterol (XOPENEX) nebulizer solution 0.63 mg, 0.63 mg, Nebulization, Q6H PRN, Sheikh, Omair Latif, DO   lidocaine (LIDODERM) 5 % 1 patch, 1 patch, Transdermal, Q24H, Xu, Fang, MD, 1 patch at 10/09/21 1054   loperamide (IMODIUM) capsule 2 mg, 2 mg, Oral, Q6H PRN, Xu, Fang, MD, 2 mg at 10/08/21 1756   MEDLINE mouth rinse, 15 mL, Mouth Rinse, BID, Ortiz, David Manuel, MD, 15 mL at 10/09/21 1055   nutrition supplement (JUVEN) (JUVEN) powder packet 1 packet, 1 packet, Per Tube, BID BM, Xu, Fang, MD, 1 packet at 10/09/21 1400   nystatin (MYCOSTATIN) 100000 UNIT/ML suspension 500,000 Units, 5 mL, Oral, QID, Xu, Fang, MD, 500,000 Units at 10/09/21 1400   ondansetron (ZOFRAN) tablet 4 mg, 4 mg, Oral, Q6H PRN **OR** ondansetron (ZOFRAN) injection 4 mg, 4 mg, Intravenous, Q6H PRN, Ortiz, David Manuel, MD, 4 mg at 10/06/21 1306   oxyCODONE (Oxy IR/ROXICODONE) immediate release tablet 5 mg, 5 mg, Per Tube, Q6H PRN, Sheikh, Omair Latif, DO, 5 mg at 10/09/21 1140   pantoprazole sodium (PROTONIX) 40 mg/20 mL oral suspension 40 mg, 40 mg, Per Tube, Daily, Legge, Justin M, RPH, 40 mg at 10/09/21 1140   saccharomyces boulardii (FLORASTOR) capsule 250 mg, 250 mg, Oral, BID, Xu, Fang, MD, 250 mg at 10/09/21 1050   simvastatin  (ZOCOR) tablet 40 mg, 40 mg, Per Tube, QPM, Sheikh, Omair Latif, DO, 40 mg at 10/08/21 1756   tamsulosin (FLOMAX) capsule 0.4 mg, 0.4 mg, Oral, QPC supper, Xu, Fang, MD, 0.4 mg at 10/08/21 1756  Patients Current Diet:  Diet Order             DIET - DYS 1 Room service appropriate? Yes; Fluid consistency: Thin  Diet effective now           Diet general                   Precautions / Restrictions Precautions Precautions: Fall Precaution Comments: g-tube, orthostatic, left hip pain Restrictions Weight Bearing Restrictions:   No   Has the patient had 2 or more falls or a fall with injury in the past year? Yes  Prior Activity Level Community (5-7x/wk): drives, gets out of house daily  Prior Functional Level Self Care: Did the patient need help bathing, dressing, using the toilet or eating? Independent  Indoor Mobility: Did the patient need assistance with walking from room to room (with or without device)? Independent  Stairs: Did the patient need assistance with internal or external stairs (with or without device)? Independent  Functional Cognition: Did the patient need help planning regular tasks such as shopping or remembering to take medications? Independent  Patient Information Are you of Hispanic, Latino/a,or Spanish origin?: X. Patient unable to respond, A. No, not of Hispanic, Latino/a, or Spanish origin What is your race?: X. Patient unable to respond, A. White Do you need or want an interpreter to communicate with a doctor or health care staff?: 9. Unable to respond  Patient's Response To:  Health Literacy and Transportation Is the patient able to respond to health literacy and transportation needs?: No Health Literacy - How often do you need to have someone help you when you read instructions, pamphlets, or other written material from your doctor or pharmacy?: Patient unable to respond  [place] / [place] Devices/Equipment: Blood  pressure cuff, Grab bars around toilet, Grab bars in shower, Hand-held shower hose, Environmental consultant (specify type), Wheelchair, Feeding equipment, Scales, Hospital bed, Other (Comment) (foley catheter, peg tube) Home Equipment: Shower seat, Conservation officer, nature (2 wheels)  Prior Device Use: Indicate devices/aids used by the patient prior to current illness, exacerbation or injury? None of the above  Current Functional Level Cognition  Overall Cognitive Status: Within Functional Limits for tasks assessed Current Attention Level: Sustained Orientation Level: Oriented X4 Following Commands: Follows one step commands with increased time, Follows multi-step commands inconsistently Safety/Judgement: Decreased awareness of safety General Comments: wife was present, pt continues with flat affect and slightly anxious about OOB mobility, pt reassured.    Extremity Assessment (includes Sensation/Coordination)  Upper Extremity Assessment: Defer to OT evaluation RUE Deficits / Details: MMT 3+/5, biceps and triceps 4/5, gross grip 4+/5 RUE Sensation: WNL RUE Coordination: decreased fine motor LUE Deficits / Details: MMT 2/5, biceps and triceps 4/5, gross grip 3+/5 LUE: Shoulder pain with ROM LUE Sensation: WNL LUE Coordination: decreased fine motor  Lower Extremity Assessment: RLE deficits/detail, LLE deficits/detail RLE Deficits / Details: knee ext 4/5, reports B feet feel "a little numb" RLE Sensation: decreased light touch RLE Coordination: WNL LLE Deficits / Details: knee ext +2/5 limited by pain LLE Sensation: decreased light touch    ADLs  Overall ADL's : Needs assistance/impaired Eating/Feeding: Sitting, Set up Grooming: Moderate assistance, Sitting Upper Body Bathing: Minimal assistance, Sitting Lower Body Bathing: Maximal assistance, Sitting/lateral leans, +2 for physical assistance Upper Body Dressing : Moderate assistance, Sitting Lower Body Dressing: Maximal assistance, +2 for physical  assistance, Sit to/from stand Toilet Transfer Details (indicate cue type and reason): toileting bed level at this time Toileting- Clothing Manipulation and Hygiene: Maximal assistance, Bed level Functional mobility during ADLs: +2 for safety/equipment, +2 for physical assistance, Maximal assistance General ADL Comments: attempted to participate in sitting on edge of bed for grooming tasks with patient reporting need to have BM. patient was mod A to roll to each side in bed to complete hygiene tasks. patient upon second attempt to sit on edge of bed reported he needed to void again. patient was noted to have had  another loose BM. session was ended with nurse in room to assist with hygiene tasks.patients wife inquired about CIR v.s. home with HH. patients wife was educated on differences of each. patients wife reported she still wants CIR for sure at this time.    Mobility  Overal bed mobility: Needs Assistance Bed Mobility: Rolling, Sidelying to Sit Rolling: Min assist Sidelying to sit: Mod assist Supine to sit: Max assist, +2 for safety/equipment, HOB elevated Sit to sidelying: Max assist General bed mobility comments: required physical assist to bring LEs off side of bed and power up into sitting.    Transfers  Overall transfer level: Needs assistance Transfers: Sit to/from Stand, Bed to chair/wheelchair/BSC Sit to Stand: +2 physical assistance, +2 safety/equipment, From elevated surface, Mod assist Stand pivot transfers: Total assist, +2 physical assistance Transfer via Lift Equipment: Stedy General transfer comment: +2 mod A (pt 60%) to rise from elevated bed, pt actively assisted with  pulling up on bar on Stedy; pt stood with active weight bearing through BLEs (~30 seconds+ 10 seconds + 10 seconds) x 3 trials then fatigued and sat on flaps of Stedy.    Ambulation / Gait / Stairs / Wheelchair Mobility  Ambulation/Gait General Gait Details: unable    Posture / Balance Dynamic Sitting  Balance Sitting balance - Comments: BUE support to maintain sitting balance. Sat EOB for ~8 minutes. Mild posterior lean intially, then fair. Balance Overall balance assessment: Needs assistance Sitting-balance support: No upper extremity supported, Feet supported Sitting balance-Leahy Scale: Fair Sitting balance - Comments: BUE support to maintain sitting balance. Sat EOB for ~8 minutes. Mild posterior lean intially, then fair. Postural control: Right lateral lean    Special needs/care consideration Diabetic management yes   Previous Home Environment (from acute therapy documentation) Living Arrangements: Spouse/significant other, Children  Lives With: Spouse, Daughter Available Help at Discharge: Family, Available 24 hours/day Type of Home: House Home Layout: One level Home Access: Level entry Bathroom Shower/Tub: Walk-in shower Bathroom Toilet: Standard Bathroom Accessibility: Yes How Accessible: Accessible via walker Home Care Services: No Additional Comments: lived with wife before accident and stroke. discharged to SNF for one day before admittance to [facility]. At baseline was independent with mobility.  Discharge Living Setting Plans for Discharge Living Setting: Patient's home Type of Home at Discharge: House Discharge Home Layout: One level Discharge Home Access: Level entry Discharge Bathroom Shower/Tub: Walk-in shower Discharge Bathroom Toilet: Standard Discharge Bathroom Accessibility: Yes How Accessible: Accessible via walker Does the patient have any problems obtaining your medications?: No  Social/Family/Support Systems Patient Roles: Spouse, Parent Anticipated Caregiver: Nancy, wife Anticipated Caregiver's Contact Information: [phone] Caregiver Availability: 24/7 Discharge Plan Discussed with Primary Caregiver: Yes Is Caregiver In Agreement with Plan?: Yes Does Caregiver/Family have Issues with Lodging/Transportation while Pt is in Rehab?:  No  Goals Patient/Family Goal for Rehab: Supervision:PT/OT, Min A:ST Expected length of stay: 24-26 days Pt/Family Agrees to Admission and willing to participate: Yes Program Orientation Provided & Reviewed with Pt/Caregiver Including Roles  & Responsibilities: Yes  Decrease burden of Care through IP rehab admission: NA  Possible need for SNF placement upon discharge: Not anticipated  Patient Condition: I have reviewed medical records from [facility], spoken with CM, and spouse. I discussed via phone for inpatient rehabilitation assessment.  Patient will benefit from ongoing PT, OT, and SLP, can actively participate in 3 hours of therapy a day 5 days of the week, and can make measurable gains during the admission.  Patient will also benefit from the   coordinated team approach during an Inpatient Acute Rehabilitation admission.  The patient will receive intensive therapy as well as Rehabilitation physician, nursing, social worker, and care management interventions.  Due to bladder management, bowel management, safety, skin/wound care, disease management, medication administration, pain management, and patient education the patient requires 24 hour a day rehabilitation nursing.  The patient is currently min to mod assist +2 with mobility and basic ADLs.  Discharge setting and therapy post discharge at home with home health is anticipated.  Patient has agreed to participate in the Acute Inpatient Rehabilitation Program and will admit today.  Preadmission Screen Completed By:  Caitlin E Warren, 10/09/2021 4:31 PM ______________________________________________________________________   Discussed status with Dr.   on 10/10/21 at 0930 and received approval for admission today.  Admission Coordinator:  Caitlin E Warren, PT, time 1030/Date 10/10/21 Assessment/Plan: Diagnosis: Does the need for close, 24 hr/day Medical supervision in concert with the patient's rehab needs make it unreasonable for  this patient to be served in a less intensive setting? Yes Co-Morbidities requiring supervision/potential complications: pneumonia due to stroke/aspiration- SOB; recent SDH/IPH/IVH due to occipital fx/ischemic stroke Due to bladder management, bowel management, safety, skin/wound care, disease management, medication administration, pain management, and patient education, does the patient require 24 hr/day rehab nursing? Yes Does the patient require coordinated care of a physician, rehab nurse, PT, OT, and SLP to address physical and functional deficits in the context of the above medical diagnosis(es)? Yes Addressing deficits in the following areas: balance, endurance, locomotion, strength, transferring, bowel/bladder control, bathing, dressing, feeding, grooming, toileting, cognition, speech, language, and swallowing Can the patient actively participate in an intensive therapy program of at least 3 hrs of therapy 5 days a week? Yes The potential for patient to make measurable gains while on inpatient rehab is good and fair Anticipated functional outcomes upon discharge from inpatient rehab: min assist PT, min assist OT, min assist SLP Estimated rehab length of stay to reach the above functional goals is: 24-26 days Anticipated discharge destination: Home 10. Overall Rehab/Functional Prognosis: good and fair   MD Signature:  

## 2021-10-02 ENCOUNTER — Inpatient Hospital Stay (HOSPITAL_COMMUNITY): Payer: Medicare Other

## 2021-10-02 DIAGNOSIS — E785 Hyperlipidemia, unspecified: Secondary | ICD-10-CM | POA: Diagnosis not present

## 2021-10-02 DIAGNOSIS — A419 Sepsis, unspecified organism: Secondary | ICD-10-CM | POA: Diagnosis not present

## 2021-10-02 DIAGNOSIS — T17908D Unspecified foreign body in respiratory tract, part unspecified causing other injury, subsequent encounter: Secondary | ICD-10-CM | POA: Diagnosis not present

## 2021-10-02 DIAGNOSIS — K219 Gastro-esophageal reflux disease without esophagitis: Secondary | ICD-10-CM | POA: Diagnosis not present

## 2021-10-02 LAB — CBC WITH DIFFERENTIAL/PLATELET
Abs Immature Granulocytes: 0.19 10*3/uL — ABNORMAL HIGH (ref 0.00–0.07)
Basophils Absolute: 0.1 10*3/uL (ref 0.0–0.1)
Basophils Relative: 1 %
Eosinophils Absolute: 0.1 10*3/uL (ref 0.0–0.5)
Eosinophils Relative: 2 %
HCT: 38.1 % — ABNORMAL LOW (ref 39.0–52.0)
Hemoglobin: 12.4 g/dL — ABNORMAL LOW (ref 13.0–17.0)
Immature Granulocytes: 2 %
Lymphocytes Relative: 32 %
Lymphs Abs: 2.9 10*3/uL (ref 0.7–4.0)
MCH: 28.5 pg (ref 26.0–34.0)
MCHC: 32.5 g/dL (ref 30.0–36.0)
MCV: 87.6 fL (ref 80.0–100.0)
Monocytes Absolute: 0.9 10*3/uL (ref 0.1–1.0)
Monocytes Relative: 9 %
Neutro Abs: 5 10*3/uL (ref 1.7–7.7)
Neutrophils Relative %: 54 %
Platelets: 251 10*3/uL (ref 150–400)
RBC: 4.35 MIL/uL (ref 4.22–5.81)
RDW: 14 % (ref 11.5–15.5)
WBC: 9.2 10*3/uL (ref 4.0–10.5)
nRBC: 0 % (ref 0.0–0.2)

## 2021-10-02 LAB — PHOSPHORUS: Phosphorus: 2.9 mg/dL (ref 2.5–4.6)

## 2021-10-02 LAB — COMPREHENSIVE METABOLIC PANEL
ALT: 76 U/L — ABNORMAL HIGH (ref 0–44)
AST: 54 U/L — ABNORMAL HIGH (ref 15–41)
Albumin: 2.2 g/dL — ABNORMAL LOW (ref 3.5–5.0)
Alkaline Phosphatase: 154 U/L — ABNORMAL HIGH (ref 38–126)
Anion gap: 9 (ref 5–15)
BUN: 18 mg/dL (ref 8–23)
CO2: 23 mmol/L (ref 22–32)
Calcium: 8.3 mg/dL — ABNORMAL LOW (ref 8.9–10.3)
Chloride: 102 mmol/L (ref 98–111)
Creatinine, Ser: 0.49 mg/dL — ABNORMAL LOW (ref 0.61–1.24)
GFR, Estimated: >60 mL/min (ref >60)
Glucose, Bld: 127 mg/dL — ABNORMAL HIGH (ref 70–99)
Potassium: 4.1 mmol/L (ref 3.5–5.1)
Sodium: 134 mmol/L — ABNORMAL LOW (ref 135–145)
Total Bilirubin: 0.3 mg/dL (ref 0.3–1.2)
Total Protein: 6.2 g/dL — ABNORMAL LOW (ref 6.5–8.1)

## 2021-10-02 LAB — CULTURE, BLOOD (ROUTINE X 2)
Culture: NO GROWTH
Culture: NO GROWTH
Special Requests: ADEQUATE

## 2021-10-02 LAB — GLUCOSE, CAPILLARY
Glucose-Capillary: 117 mg/dL — ABNORMAL HIGH (ref 70–99)
Glucose-Capillary: 122 mg/dL — ABNORMAL HIGH (ref 70–99)
Glucose-Capillary: 133 mg/dL — ABNORMAL HIGH (ref 70–99)
Glucose-Capillary: 136 mg/dL — ABNORMAL HIGH (ref 70–99)
Glucose-Capillary: 143 mg/dL — ABNORMAL HIGH (ref 70–99)
Glucose-Capillary: 149 mg/dL — ABNORMAL HIGH (ref 70–99)
Glucose-Capillary: 150 mg/dL — ABNORMAL HIGH (ref 70–99)

## 2021-10-02 LAB — MAGNESIUM: Magnesium: 2.1 mg/dL (ref 1.7–2.4)

## 2021-10-02 NOTE — Care Management Important Message (Signed)
Important Message  Patient Details IM Letter given to the Patient. Name: Dwon Sky MRN: [id number] Date of Birth: July 09, 1951   Medicare Important Message Given:  Yes     Caren Macadam 10/02/2021, 10:02 AM

## 2021-10-02 NOTE — Progress Notes (Signed)
Speech Language Pathology Treatment: Dysphagia  Patient Details Name: Raymond Stuart MRN: [id number] DOB: 1951/07/05 Today's Date: 10/02/2021 Time: 9449-6759 SLP Time Calculation (min) (ACUTE ONLY): 30 min  Assessment / Plan / Recommendation Clinical Impression  Patient seen with wife present in room for skilled SLP intervention focusing on dysphagia treatment and education. Patient alert and cooperative but with flat affect overall. He asked if it would be easier to have him sit in the recliner, however wife and SLP redirected to just staying in bed (patient is a difficult transfer from bed). SLP observed mild amount of thick, whitish, sticky secretions throughout oral cavity. After setup, patient able perform oral care via toothbrushing, followed by use of toothette sponge soaked in water and mild amount of oral suctioning. Patient then able to give self small sips of nectar thick liquids via cup. No overt s/s aspiration or penetration observed but do suspect delayed swallow initiation and pharyngeal retention based on recent MBS report. SLP recommended to patient and wife to be diligent with oral care and keeping oral mucosa moist and clean, and to continue PRN ice/water. Wife said that patient has been accepting a piece or two of ice but then will decline much more than that. SLP to continue to follow patient for readiness to start full PO's.    HPI HPI: Raymond Stuart is a 70 y.o. male with medical history significant for GERD hyperlipidemia, osteoarthritis, urolithiasis who was recently admitted to [facility] (9/26-29) after falling, hitting his head on concrete and sustaining a large right frontal intraparenchymal hemorrhage, large intraventricular hemorrhage, SDH and right occipital bone fx. PEG 10/27.  D/Cd home, 9/30 admitted to [facility] then D/Cd  to SNF for rehab on 11/1.  Pt was transferred from his facility after being there for less than 24 hours to [facility] after being found covered in vomit, febrile at 101.2  F,, tachypneic/dyspneic and hypoxic in the 80s.  Per Md notes, "he responded to NRB oxygen at 10 LPM then subsequently brought to the emergency department.  At the facility, he was started getting tube feeding around 2300 last night.  He has been bedbound.  A significant portion of the history is taken from his wife, but he was able to say a few things and answer simple questions."  Swallow eval ordered.  Pt has hx of dysphagia diagnosed initially 10/4 and mech soft diet ordered, mentation waxed/waned and pt was made npo with Dobhoff.  MBS then conducted 10/19 and 11/1 - recommendation was npo x frazier water protocol.  Large barrier to adequate po intake has been his mentation.    Recs from 11/1 MBS were npo and ice chips with aggressive SLP at next venue of care.  11/3 clinical swallow evaluation recommended NPO except ice chips and repeat MBS. Repeat MBS completed 11/4 with recommendation to continue NPO except for ice/water PRN and nectar thick liquid trials with SLP only.      SLP Plan  Continue with current plan of care      Recommendations for follow up therapy are one component of a multi-disciplinary discharge planning process, led by the attending physician.  Recommendations may be updated based on patient status, additional functional criteria and insurance authorization.    Recommendations  Diet recommendations: NPO;Other(comment) (ice chips/water sips PRN after oral care; nectar thick liquid trials with SLP only) Medication Administration: Via alternative means Compensations: Minimize environmental distractions Postural Changes and/or Swallow Maneuvers: Seated upright 90 degrees  General recommendations: Rehab consult Oral Care Recommendations: Oral care QID;Oral care prior to ice chip/H20;Staff/trained caregiver to provide oral care Follow up Recommendations: Inpatient Rehab SLP Visit Diagnosis: Dysphagia, oropharyngeal phase (R13.12) Plan: Continue with current  plan of care       Angela Nevin, MA, CCC-SLP Speech Therapy

## 2021-10-02 NOTE — Progress Notes (Signed)
PROGRESS NOTE    Raymond Stuart  HGD:[id number] DOB: 1951/04/30 DOA: 09/27/2021 PCP: Glendon Axe, MD  Brief Narrative:  The patient is a 70 year old Caucasian male with a past medical history significant for but not limited to GERD, hyperlipidemia, osteoarthritis, urolithiasis as well as other comorbidities who was recently admitted to [facility] and then [facility] after having a subdural hematoma, traumatic intraparenchymal hemorrhage, ventricular hemorrhage in the setting of acute head injury with occipital fracture, ischemic stroke followed by aspiration pneumonia in setting of stroke dysphagia status post PEG placement, transaminitis, AKI, acute urinary tension with indwelling Foley catheter was then transferred from his rehab facility yesterday morning after being found covered in vomit and febrile with 101.2 as well as being dyspneic and hypoxic in the 80s.  He responded to nonrebreather oxygen at 10L/min and then subsequently was brought to the ED.  At the facility he was started getting tube feedings around 11 PM and he has been bedbound since his stroke.  In the ED he is noted to be septic with a temperature of 101.2 and a pulse of 120 and respirations of 25.  He had a new O2 requirement and was placed on 3 L supplemental oxygen via nasal cannula.  His urinalysis was concerning for urinary tract infection and he had microscopic blood with 50 RBCs and 50 WBCs and many bacteria.  WBC was elevated at 15.3 and lactic acid was elevated.  He was also found to have some mildly abnormal LFTs.  Chest x-ray was done and showed low lung volumes without any evidence of acute cardiopulmonary disease.  He was admitted for sepsis secondary to likely aspiration pneumonia with concomitant UTI and he is improving.  The night before last he had mulitple bowel movements and diarrhea but he is Afebrile, has no Leukocytosis, and is on TF and Abx. Will need to continue to Monitor and if continues to worsen  and if he spikes a temperature and worsens WBC will work up for Infectious Diarrhea.  Diarrhea is improving slightly.  Assessment & Plan:   Principal Problem:   Sepsis due to undetermined organism [facility]) Active Problems:   Lactic acidosis   GERD (gastroesophageal reflux disease)   Type 2 diabetes mellitus with hyperglycemia (HCC)   Hyperlipidemia   Transaminitis   Moderate protein malnutrition (HCC)   Pressure injury of skin   Protein-calorie malnutrition, severe   Aspiration into airway  Severe sepsis secondary to likely aspiration pneumonia with concomitant catheter associated Klebsiella and Pseudomonas UTI, present on admission -He was placed in the stepdown unit -Met sepsis criteria on admission as he is febrile, tachypneic, tachycardic, had a leukocytosis as well as a source of infection as well as a new O2 requirement -He was given IV fluid hydration with LR at 150 MLS per hour which is now stopped after 20 hours -Lactic acid was elevated at 3.1 and then trended down to 1.8 with fluid resuscitation -Patient has a chronic Foley since his last admission and he was unable to have it discontinued at the last hospitalization; he presented with a Foley and so this was changed yesterday but did show signs of an infection -WBC has trended down from 15.3 -> 9.5 -> 6.3 -> 7.1 and went to 9.2 -Blood cultures x2 show no growth to date at 5 Days  -He had a urinalysis done which showed a cloudy appearance with amber color urine, large hemoglobin, moderate leukocytes, many bacteria, greater than 50 RBCs per high-power field, greater than 50  WBCs and a urine culture that shows greater than 100,000 colony-forming unit of gram-negative rods and showing >100,000 of Klebsiella Pneumoniae and >100,000 of Pseudomonas Aeruginosa  with Sensitivities sensitive to IV Zosyn -Change antibiotics to IV Zosyn 09/29/21 and will continue for now -We will obtain SLP evaluation and they did a modified barium swallow;  after MBS they are recommending ice chips as needed after oral care and free water protocol after oral care and recommending continue n.p.o. -Continue to follow cultures and continue Abx as above -Added Guaifenesin 5 mL every 4 hours to loosen cough and some Tussionex for cough suppression -Continue to monitor temperature curve and WBC and follow-up on the cultures  Acute respiratory failure with hypoxia in the setting of aspiration from his vomit -Noted to have an O2 saturation in the 80s on arrival to the ED had to be placed on a nonrebreather -SpO2: 94 % O2 Flow Rate (L/min): 1 L/min -Now was weaned off of supplemental oxygen via nasal cannula -Continuous pulse oximetry maintain O2 saturation greater than 90% -Continue supplemental oxygen via nasal cath warranted and wean to room air as able -Repeat CXR this AM showed "There is interval increase in interstitial markings in the mid and lower lung fields suggesting interstitial edema or interstitial pneumonitis. Part of this finding may be due to poor inspiration. Linear densities in the left mid and right lower lung fields suggest subsegmental atelectasis. Possible minimal left pleural effusion." -We will add Tussionex as above  -We will add Xopenex/Atrovent every 6 as needed for wheezing and shortness of breath  Diarrhea improving -In the setting of Tube Feedings and Abx with IV Zosyn  -Has had multiple bowel movements overnight -Continue to Monitor and will try Anti-Diarrheals and consider Infectious Workup if continues to Worsen   Pressure ulcer -Present on admission -Continue with local and preventive measures and will consult wound care for further evaluation -Continue with Gerhardt's Butt cream topically 2 times daily  GERD/GI prophylaxis -Continue with PPI with Lansoprazole 15 mg per tube  Diabetes mellitus type 2 with hyperglycemia -Continue with sensitive NovoLog sliding scale insulin every 4h -Continue to monitor blood  sugars carefully; CBG's have been ranging from 103-144  Dysphagia in the setting of CVA -Continue tube feedings with Jevity at 60 mL/h continuous along with Prosource plus tube feedings 45 mL per tube twice daily and 150 mL free water flushes every 4h -Have consulted SLP for further evaluation and MBS done  Normocytic Anemia -Patient's hemoglobin/hematocrit went from 13.6/42.6 and trended down to 10.9/33.8 -> 10.6/33.4 -> 10.5/32.6 -> 11.7/37.0 and is now 12.4/38.1 -Likely dilutional drop in the setting of IV fluid resuscitation but now IVF stopped -Check anemia panel in the a.m. -Continue to monitor for signs and symptoms of bleeding; currently no overt bleeding noted -Repeat CBC in a.m.  Hypokalemia  -Mild. K+ went from 4.2 -> 3.4 -> 3.5 -> 4.1 x2 -Replete with po Kcl 40 mEQ BID x2 via PEG again  -Continue to Monitor and Replete as Necessary -Repeat CMP in the AM   Hyponatremia -Mild. Na+ went from 136 -> 133 -> 135 -> 134 -Continue to Monitor and Trend -Repeat CMP in the AM   Hypophosphatemia -Patient's Phos Level was 2.3 and improved to 2.9 -Continue to Monitor and Trend -Repeat Phos Level in the AM  Urinary Retention and ? Neurogenic Bladder -Had a foley placed last hopsitalizaion and was unable to have it removed due to retention -May discuss with Urology here and Try TOV  Hyperlipidemia  -Holding statin in the setting of his transaminitis and abnormal LFTs  Abnormal LFTs -Improved and likely in the setting of sepsis initially but now slightly worsened  -AST went from 46 and trended down to 33 -> 37 -> 44 -> 47 -> 54 -ALT went from 78 and trended down to 49 -> 46 -> 52 -> 62 -> 76 -Continue to monitor and trend hepatic function carefully; Will obtain a right upper quadrant ultrasound as well as an acute hepatitis panel -Repeat CMP in a.m.  Generalized weakness and deconditioning in the setting of his acute CVA and prolonged hospitalization -Has some residual left  hemiplegia -Resume ASA and Atorvastatin  -We will consult PT and OT to further evaluate and treat and they are recommending Inpatient Rehab -Rehabs Coordinator consulted and feel that he could be a potential candidate though Insurance Auth may be difficult; CIR pursuing potential   Hypoalbuminemia -T Bili went from 1.9 -> 2.0 -> 2.2 -Continue to Monitor and trend -Repeat CMP in the AM  Severe malnutrition in the context of acute illness/injury -Nutritionist has been consulted for further evaluation recommendations -Have ordered the patient Jevity 1.5 x 30 mL/h to advance by 10 mils per hour every 8 hours to reach a goal rate of 60 mils per hour with 45 mL of Prosource TF twice daily and 150 mL free water flushes every 4 hours -They are recommending slow advancement due to concern for aspiration -SLP consulted for MBS and recommending Ice Chips   DVT prophylaxis: SCDs; will consider pharmacological prophylaxis in the morning with enoxaparin Code Status: FULL CODE Family Communication: Discussed with wife at bedside  Disposition Plan: Pending further clinical improvement and evaluation by PT and OT as well as SLP; Potentially CIR for Rehab   Status is: Inpatient   The patient will require care spanning > 2 midnights and should be moved to inpatient because: Patient was meeting sepsis criteria and is improving however still not back to his baseline.  SLP is evaluating and planning for an MBS in the morning and PT OT will still need to see the patient for further evaluation and recommending Inpatient Rehab.   Consultants:  None  Procedures: None  Antimicrobials:  Anti-infectives (From admission, onward)    Start     Dose/Rate Route Frequency Ordered Stop   09/29/21 1200  piperacillin-tazobactam (ZOSYN) IVPB 3.375 g        3.375 g 12.5 mL/hr over 240 Minutes Intravenous Every 8 hours 09/29/21 1037     09/28/21 1200  Ampicillin-Sulbactam (UNASYN) 3 g in sodium chloride 0.9 % 100 mL  IVPB  Status:  Discontinued        3 g 200 mL/hr over 30 Minutes Intravenous Every 6 hours 09/28/21 1012 09/29/21 1037   09/27/21 1700  ceFEPIme (MAXIPIME) 2 g in sodium chloride 0.9 % 100 mL IVPB  Status:  Discontinued        2 g 200 mL/hr over 30 Minutes Intravenous Every 8 hours 09/27/21 0848 09/28/21 1012   09/27/21 0815  ceFEPIme (MAXIPIME) 2 g in sodium chloride 0.9 % 100 mL IVPB        2 g 200 mL/hr over 30 Minutes Intravenous  Once 09/27/21 0800 09/27/21 0943   09/27/21 0645  cefTRIAXone (ROCEPHIN) 2 g in sodium chloride 0.9 % 100 mL IVPB  Status:  Discontinued        2 g 200 mL/hr over 30 Minutes Intravenous Every 24 hours 09/27/21 0642 09/27/21 0800  09/27/21 0645  azithromycin (ZITHROMAX) 500 mg in sodium chloride 0.9 % 250 mL IVPB  Status:  Discontinued        500 mg 250 mL/hr over 60 Minutes Intravenous Every 24 hours 09/27/21 0569 09/27/21 0801        Subjective: Seen and examined at bedside and he states that his cough is improved.  Diarrhea is improving as well.  No nausea or vomiting.  Feels okay.  Having some pain but fairly well controlled.  No other concerns or complaints this time.  Objective: Vitals:   10/01/21 1320 10/01/21 1951 10/02/21 0437 10/02/21 0515  BP: 128/86 126/71  130/72  Pulse: 96 92  92  Resp: (!) 21 18  18   Temp: 97.6 F (36.4 C) 98.5 F (36.9 C)  98.5 F (36.9 C)  TempSrc: Oral Oral  Oral  SpO2: 96% 94%  94%  Weight:   84.3 kg   Height:        Intake/Output Summary (Last 24 hours) at 10/02/2021 1315 Last data filed at 10/02/2021 1000 Gross per 24 hour  Intake 2838.11 ml  Output 3725 ml  Net -886.89 ml    Filed Weights   09/30/21 1000 10/01/21 0521 10/02/21 0437  Weight: 80 kg 87 kg 84.3 kg   Examination: Physical Exam:  Constitutional: Mildly ill-appearing Caucasian male currently in no acute distress appears calm  Eyes: Lids and conjunctivae normal, sclerae anicteric  ENMT: External Ears, Nose appear normal. Grossly normal  hearing.  Neck: Appears normal, supple, no cervical masses, normal ROM, no appreciable thyromegaly; no appreciable JVD Respiratory: Slightly diminished to auscultation bilaterally with coarse breath sounds, no wheezing, rales, rhonchi or crackles. Normal respiratory effort and patient is not tachypenic. No accessory muscle use.  Unlabored breathing Cardiovascular: RRR, no murmurs / rubs / gallops. S1 and S2 auscultated.  Has some mild lower extremity edema Abdomen: Soft, non-tender, non-distended.  Has a PEG tube placed in the mid abdomen.. Bowel sounds positive.  GU: Deferred. Musculoskeletal: No clubbing / cyanosis of digits/nails. No joint deformity upper and lower extremities. Skin: I did not turn him to view his backside wound. No induration; Warm and dry.  Neurologic: CN 2-12 grossly intact with no focal deficits.  Romberg sign and cerebellar reflexes not assessed.  Psychiatric: Normal judgment and insight. Alert and oriented x 3. Normal mood and appropriate affect.   Data Reviewed: I have personally reviewed following labs and imaging studies  CBC: Recent Labs  Lab 09/28/21 0305 09/29/21 0509 09/30/21 0552 10/01/21 0608 10/02/21 0937  WBC 9.5 6.3 6.8 7.1 9.2  NEUTROABS 6.1 3.8 3.8 3.7 5.0  HGB 10.9* 10.6* 10.5* 11.7* 12.4*  HCT 33.8* 33.4* 32.6* 37.0* 38.1*  MCV 90.4 89.3 90.1 89.6 87.6  PLT 171 188 184 222 794    Basic Metabolic Panel: Recent Labs  Lab 09/28/21 0305 09/28/21 1309 09/29/21 0509 09/29/21 1646 09/30/21 0552 10/01/21 0608 10/02/21 0937  NA 137  --  136  --  133* 135 134*  K 4.2  --  3.4*  --  3.5 4.1 4.1  CL 107  --  105  --  102 101 102  CO2 24  --  25  --  23 25 23   GLUCOSE 83  --  112*  --  129* 131* 127*  BUN 20  --  21  --  19 17 18   CREATININE 0.60*  --  0.63  --  0.41* 0.59* 0.49*  CALCIUM 8.3*  --  8.0*  --  7.5* 8.1* 8.3*  MG  --    < > 1.9 1.9 1.9 2.1 2.1  PHOS  --    < > 3.4 3.3 2.4* 2.3* 2.9   < > = values in this interval not  displayed.    GFR: Estimated Creatinine Clearance: 97 mL/min (A) (by C-G formula based on SCr of 0.49 mg/dL (L)). Liver Function Tests: Recent Labs  Lab 09/28/21 0305 09/29/21 0509 09/30/21 0552 10/01/21 0608 10/02/21 0937  AST 33 37 44* 47* 54*  ALT 49* 46* 52* 62* 76*  ALKPHOS 113 134* 133* 141* 154*  BILITOT 0.8 0.7 0.4 0.4 0.3  PROT 5.6* 5.6* 5.3* 5.8* 6.2*  ALBUMIN 2.0* 2.1* 1.9* 2.0* 2.2*    No results for input(s): LIPASE, AMYLASE in the last 168 hours. No results for input(s): AMMONIA in the last 168 hours. Coagulation Profile: Recent Labs  Lab 09/27/21 0648  INR 1.1    Cardiac Enzymes: No results for input(s): CKTOTAL, CKMB, CKMBINDEX, TROPONINI in the last 168 hours. BNP (last 3 results) No results for input(s): PROBNP in the last 8760 hours. HbA1C: No results for input(s): HGBA1C in the last 72 hours. CBG: Recent Labs  Lab 10/01/21 1957 10/02/21 0000 10/02/21 0405 10/02/21 0754 10/02/21 1137  GLUCAP 133* 136* 122* 149* 133*    Lipid Profile: No results for input(s): CHOL, HDL, LDLCALC, TRIG, CHOLHDL, LDLDIRECT in the last 72 hours. Thyroid Function Tests: No results for input(s): TSH, T4TOTAL, FREET4, T3FREE, THYROIDAB in the last 72 hours. Anemia Panel: No results for input(s): VITAMINB12, FOLATE, FERRITIN, TIBC, IRON, RETICCTPCT in the last 72 hours. Sepsis Labs: Recent Labs  Lab 09/27/21 3300 09/27/21 0803 09/27/21 1000 09/27/21 1620  LATICACIDVEN 3.0* 3.1* 2.6* 1.8     Recent Results (from the past 240 hour(s))  Blood Culture (routine x 2)     Status: None   Collection Time: 09/27/21  6:47 AM   Specimen: BLOOD  Result Value Ref Range Status   Specimen Description   Final    BLOOD BLOOD RIGHT FOREARM Performed at [facility] [address], [place], [place] [address]    Special Requests   Final    BOTTLES DRAWN AEROBIC AND ANAEROBIC Blood Culture adequate volume Performed at [facility] [address] [place], [place] [address]    Culture   Final    NO GROWTH 5 DAYS Performed at [facility], [place] [address] [place], [place] [address]    Report Status 10/02/2021 FINAL  Final  Resp Panel by RT-PCR (Flu A&B, Covid) Nasopharyngeal Swab     Status: None   Collection Time: 09/27/21  6:48 AM   Specimen: Nasopharyngeal Swab; Nasopharyngeal(NP) swabs in vial transport medium  Result Value Ref Range Status   SARS Coronavirus 2 by RT PCR NEGATIVE NEGATIVE Final    Comment: (NOTE) SARS-CoV-2 target nucleic acids are NOT DETECTED.  The SARS-CoV-2 RNA is generally detectable in upper respiratory specimens during the acute phase of infection. The lowest concentration of SARS-CoV-2 viral copies this assay can detect is 138 copies/mL. A negative result does not preclude SARS-Cov-2 infection and should not be used as the sole basis for treatment or other patient management decisions. A negative result may occur with  improper specimen collection/handling, submission of specimen other than nasopharyngeal swab, presence of viral mutation(s) within the areas targeted by this assay, and inadequate number of viral copies(<138 copies/mL). A negative result must be combined with clinical observations, patient history, and  epidemiological information. The expected result is Negative.  Fact Sheet for Patients:  EntrepreneurPulse.com.au  Fact Sheet for Healthcare Providers:  IncredibleEmployment.be  This test is no t yet approved or cleared by the [place] FDA and  has been authorized for detection and/or diagnosis of SARS-CoV-2 by FDA under an Emergency Use Authorization (EUA). This EUA will remain  in effect (meaning this test can be used) for the duration of the COVID-19 declaration under Section 564(b)(1) of the Act, 21 U.S.C.section 360bbb-3(b)(1), unless the authorization is terminated  or revoked sooner.       Influenza A by  PCR NEGATIVE NEGATIVE Final   Influenza B by PCR NEGATIVE NEGATIVE Final    Comment: (NOTE) The Xpert Xpress SARS-CoV-2/FLU/RSV plus assay is intended as an aid in the diagnosis of influenza from Nasopharyngeal swab specimens and should not be used as a sole basis for treatment. Nasal washings and aspirates are unacceptable for Xpert Xpress SARS-CoV-2/FLU/RSV testing.  Fact Sheet for Patients: EntrepreneurPulse.com.au  Fact Sheet for Healthcare Providers: IncredibleEmployment.be  This test is not yet approved or cleared by the [place] FDA and has been authorized for detection and/or diagnosis of SARS-CoV-2 by FDA under an Emergency Use Authorization (EUA). This EUA will remain in effect (meaning this test can be used) for the duration of the COVID-19 declaration under Section 564(b)(1) of the Act, 21 U.S.C. section 360bbb-3(b)(1), unless the authorization is terminated or revoked.  Performed at [facility], [place] [address], [place], [place] [address]   Urine Culture     Status: Abnormal   Collection Time: 09/27/21  6:48 AM   Specimen: In/Out Cath Urine  Result Value Ref Range Status   Specimen Description   Final    IN/OUT CATH URINE Performed at [facility] [address] [place], [place] [address]    Special Requests   Final    NONE Performed at [facility], [place] [address], [place],  [address]    Culture (A)  Final    >=100,000 COLONIES/mL KLEBSIELLA PNEUMONIAE >=100,000 COLONIES/mL PSEUDOMONAS AERUGINOSA    Report Status 09/30/2021 FINAL  Final   Organism ID, Bacteria KLEBSIELLA PNEUMONIAE (A)  Final   Organism ID, Bacteria PSEUDOMONAS AERUGINOSA (A)  Final      Susceptibility   Klebsiella pneumoniae - MIC*    AMPICILLIN >=32 RESISTANT Resistant     CEFAZOLIN <=4 SENSITIVE Sensitive     CEFEPIME <=0.12 SENSITIVE Sensitive     CEFTRIAXONE <=0.25 SENSITIVE  Sensitive     CIPROFLOXACIN <=0.25 SENSITIVE Sensitive     GENTAMICIN <=1 SENSITIVE Sensitive     IMIPENEM <=0.25 SENSITIVE Sensitive     NITROFURANTOIN 64 INTERMEDIATE Intermediate     TRIMETH/SULFA <=20 SENSITIVE Sensitive     AMPICILLIN/SULBACTAM 4 SENSITIVE Sensitive     PIP/TAZO <=4 SENSITIVE Sensitive     * >=100,000 COLONIES/mL KLEBSIELLA PNEUMONIAE   Pseudomonas aeruginosa - MIC*    CEFTAZIDIME 4 SENSITIVE Sensitive     CIPROFLOXACIN <=0.25 SENSITIVE Sensitive     GENTAMICIN 2 SENSITIVE Sensitive     IMIPENEM 2 SENSITIVE Sensitive     PIP/TAZO 8 SENSITIVE Sensitive     CEFEPIME 2 SENSITIVE Sensitive     * >=100,000 COLONIES/mL PSEUDOMONAS AERUGINOSA  Blood Culture (routine x 2)     Status: None   Collection Time: 09/27/21  6:53 AM   Specimen: BLOOD  Result Value Ref Range Status   Specimen Description   Final    BLOOD BLOOD  LEFT FOREARM Performed at [facility] [address], [place], [place] [address]    Special Requests   Final    BOTTLES DRAWN AEROBIC AND ANAEROBIC Blood Culture results may not be optimal due to an inadequate volume of blood received in culture bottles Performed at [facility] [address], [place], [place] [address]    Culture   Final    NO GROWTH 5 DAYS Performed at [facility], [place] [address], [place], [place] [address]    Report Status 10/02/2021 FINAL  Final  MRSA Next Gen by PCR, Nasal     Status: Abnormal   Collection Time: 09/27/21 12:34 PM   Specimen: Nasal Mucosa; Nasal Swab  Result Value Ref Range Status   MRSA by PCR Next Gen DETECTED (A) NOT DETECTED Final    Comment: RESULT CALLED TO, READ BACK BY AND VERIFIED WITH:  AMANDA WATSON RN 09/27/21 @ 3875 VS (NOTE) The GeneXpert MRSA Assay (FDA approved for NASAL specimens only), is one component of a comprehensive MRSA colonization surveillance program. It is not intended to diagnose MRSA infection nor to guide or monitor treatment for  MRSA infections. Test performance is not FDA approved in patients less than 4 years old. Performed at [facility], [place] [address] [place],  [address]     RN Pressure Injury Documentation: Pressure Injury 09/27/21 Coccyx Medial Stage 2 -  Partial thickness loss of dermis presenting as a shallow open injury with a red, pink wound bed without slough. stage 2, 1.5X.6 (Active)  09/27/21 1300  Location: Coccyx  Location Orientation: Medial  Staging: Stage 2 -  Partial thickness loss of dermis presenting as a shallow open injury with a red, pink wound bed without slough.  Wound Description (Comments): stage 2, 1.5X.6  Present on Admission: Yes     Pressure Injury Buttocks Left;Anterior;Medial Deep Tissue Pressure Injury - Purple or maroon localized area of discolored intact skin or blood-filled blister due to damage of underlying soft tissue from pressure and/or shear. 1.5X1 inner buttock beside s (Active)     Location: Buttocks  Location Orientation: Left;Anterior;Medial  Staging: Deep Tissue Pressure Injury - Purple or maroon localized area of discolored intact skin or blood-filled blister due to damage of underlying soft tissue from pressure and/or shear.  Wound Description (Comments): 1.5X1 inner buttock beside stage 2  Present on Admission: Yes    Estimated body mass index is 25.21 kg/m as calculated from the following:   Height as of this encounter: 6' (1.829 m).   Weight as of this encounter: 84.3 kg.  Malnutrition Type: Nutrition Problem: Severe Malnutrition Etiology: acute illness (fall on 9/26 with subsequent hemorrages and strokes) Malnutrition Characteristics: Signs/Symptoms: moderate fat depletion, moderate muscle depletion, severe muscle depletion, percent weight loss Percent weight loss: 15 % Nutrition Interventions: Interventions: Tube feeding, Prostat Radiology Studies: No results found.  Scheduled Meds:  aspirin  81 mg Per Tube q AM    Chlorhexidine Gluconate Cloth  6 each Topical Daily   diclofenac Sodium  2 g Topical QID   feeding supplement (PROSource TF)  45 mL Per Tube BID   free water  150 mL Per Tube Q4H   Gerhardt's butt cream   Topical BID   insulin aspart  0-9 Units Subcutaneous Q4H   mouth rinse  15 mL Mouth Rinse BID   mupirocin ointment  1 application Nasal BID   pantoprazole sodium  40 mg Per Tube Daily   simvastatin  40 mg Per Tube QPM   Continuous Infusions:  feeding supplement (JEVITY 1.5 CAL/FIBER) 948 mL (10/02/21 0027)   piperacillin-tazobactam 3.375 g (10/02/21 1207)    LOS: 4 days   Kerney Elbe, DO [facility] PAGER is on AMION  If 7PM-7AM, please contact night-coverage [email or web address]

## 2021-10-02 NOTE — Progress Notes (Signed)
Inpatient Rehab Admissions Coordinator:  There are no beds available in CIR today.  Also awaiting medical clearance for potential CIR admission.  Will continue to follow.   Itzae Miralles Graves Madden, MS, CCC-SLP Admissions Coordinator [phone]  

## 2021-10-03 DIAGNOSIS — A419 Sepsis, unspecified organism: Secondary | ICD-10-CM | POA: Diagnosis not present

## 2021-10-03 DIAGNOSIS — E785 Hyperlipidemia, unspecified: Secondary | ICD-10-CM | POA: Diagnosis not present

## 2021-10-03 DIAGNOSIS — K219 Gastro-esophageal reflux disease without esophagitis: Secondary | ICD-10-CM | POA: Diagnosis not present

## 2021-10-03 DIAGNOSIS — T17908D Unspecified foreign body in respiratory tract, part unspecified causing other injury, subsequent encounter: Secondary | ICD-10-CM | POA: Diagnosis not present

## 2021-10-03 LAB — CBC WITH DIFFERENTIAL/PLATELET
Abs Immature Granulocytes: 0.23 10*3/uL — ABNORMAL HIGH (ref 0.00–0.07)
Basophils Absolute: 0.1 10*3/uL (ref 0.0–0.1)
Basophils Relative: 1 %
Eosinophils Absolute: 0.2 10*3/uL (ref 0.0–0.5)
Eosinophils Relative: 2 %
HCT: 40.2 % (ref 39.0–52.0)
Hemoglobin: 12.9 g/dL — ABNORMAL LOW (ref 13.0–17.0)
Immature Granulocytes: 2 %
Lymphocytes Relative: 36 %
Lymphs Abs: 3.4 10*3/uL (ref 0.7–4.0)
MCH: 28.5 pg (ref 26.0–34.0)
MCHC: 32.1 g/dL (ref 30.0–36.0)
MCV: 88.7 fL (ref 80.0–100.0)
Monocytes Absolute: 1.1 10*3/uL — ABNORMAL HIGH (ref 0.1–1.0)
Monocytes Relative: 11 %
Neutro Abs: 4.5 10*3/uL (ref 1.7–7.7)
Neutrophils Relative %: 48 %
Platelets: 289 10*3/uL (ref 150–400)
RBC: 4.53 MIL/uL (ref 4.22–5.81)
RDW: 14.5 % (ref 11.5–15.5)
WBC: 9.4 10*3/uL (ref 4.0–10.5)
nRBC: 0.2 % (ref 0.0–0.2)

## 2021-10-03 LAB — RETICULOCYTES
Immature Retic Fract: 34.8 % — ABNORMAL HIGH (ref 2.3–15.9)
RBC.: 4.45 MIL/uL (ref 4.22–5.81)
Retic Count, Absolute: 162.9 10*3/uL (ref 19.0–186.0)
Retic Ct Pct: 3.7 % — ABNORMAL HIGH (ref 0.4–3.1)

## 2021-10-03 LAB — HEPATITIS PANEL, ACUTE
HCV Ab: NONREACTIVE
Hep A IgM: NONREACTIVE
Hep B C IgM: NONREACTIVE
Hepatitis B Surface Ag: NONREACTIVE

## 2021-10-03 LAB — COMPREHENSIVE METABOLIC PANEL
ALT: 71 U/L — ABNORMAL HIGH (ref 0–44)
AST: 42 U/L — ABNORMAL HIGH (ref 15–41)
Albumin: 2.2 g/dL — ABNORMAL LOW (ref 3.5–5.0)
Alkaline Phosphatase: 141 U/L — ABNORMAL HIGH (ref 38–126)
Anion gap: 7 (ref 5–15)
BUN: 20 mg/dL (ref 8–23)
CO2: 25 mmol/L (ref 22–32)
Calcium: 8.2 mg/dL — ABNORMAL LOW (ref 8.9–10.3)
Chloride: 102 mmol/L (ref 98–111)
Creatinine, Ser: 0.61 mg/dL (ref 0.61–1.24)
GFR, Estimated: >60 mL/min (ref >60)
Glucose, Bld: 131 mg/dL — ABNORMAL HIGH (ref 70–99)
Potassium: 3.8 mmol/L (ref 3.5–5.1)
Sodium: 134 mmol/L — ABNORMAL LOW (ref 135–145)
Total Bilirubin: 0.5 mg/dL (ref 0.3–1.2)
Total Protein: 6.2 g/dL — ABNORMAL LOW (ref 6.5–8.1)

## 2021-10-03 LAB — MAGNESIUM: Magnesium: 2.1 mg/dL (ref 1.7–2.4)

## 2021-10-03 LAB — IRON AND TIBC
Iron: 50 ug/dL (ref 45–182)
Saturation Ratios: 20 % (ref 17.9–39.5)
TIBC: 252 ug/dL (ref 250–450)
UIBC: 202 ug/dL

## 2021-10-03 LAB — GLUCOSE, CAPILLARY
Glucose-Capillary: 137 mg/dL — ABNORMAL HIGH (ref 70–99)
Glucose-Capillary: 111 mg/dL — ABNORMAL HIGH (ref 70–99)
Glucose-Capillary: 152 mg/dL — ABNORMAL HIGH (ref 70–99)
Glucose-Capillary: 130 mg/dL — ABNORMAL HIGH (ref 70–99)
Glucose-Capillary: 113 mg/dL — ABNORMAL HIGH (ref 70–99)

## 2021-10-03 LAB — VITAMIN B12: Vitamin B-12: 517 pg/mL (ref 180–914)

## 2021-10-03 LAB — PHOSPHORUS: Phosphorus: 2.9 mg/dL (ref 2.5–4.6)

## 2021-10-03 LAB — FERRITIN: Ferritin: 312 ng/mL (ref 24–336)

## 2021-10-03 LAB — FOLATE: Folate: 10.7 ng/mL (ref >5.9)

## 2021-10-03 NOTE — Progress Notes (Signed)
PROGRESS NOTE    Raymond Stuart  PVV:[id number] DOB: [birth year] DOA: 09/27/2021 PCP: Glendon Axe, MD  Brief Narrative:  The patient is a 70 year old Caucasian male with a past medical history significant for but not limited to GERD, hyperlipidemia, osteoarthritis, urolithiasis as well as other comorbidities who was recently admitted to [facility] and then [facility] after having a subdural hematoma, traumatic intraparenchymal hemorrhage, ventricular hemorrhage in the setting of acute head injury with occipital fracture, ischemic stroke followed by aspiration pneumonia in setting of stroke dysphagia status post PEG placement, transaminitis, AKI, acute urinary tension with indwelling Foley catheter was then transferred from his rehab facility yesterday morning after being found covered in vomit and febrile with 101.2 as well as being dyspneic and hypoxic in the 80s.  He responded to nonrebreather oxygen at 10L/min and then subsequently was brought to the ED.  At the facility he was started getting tube feedings around 11 PM and he has been bedbound since his stroke.  In the ED he is noted to be septic with a temperature of 101.2 and a pulse of 120 and respirations of 25.  He had a new O2 requirement and was placed on 3 L supplemental oxygen via nasal cannula.  His urinalysis was concerning for urinary tract infection and he had microscopic blood with 50 RBCs and 50 WBCs and many bacteria.  WBC was elevated at 15.3 and lactic acid was elevated.  He was also found to have some mildly abnormal LFTs.  Chest x-ray was done and showed low lung volumes without any evidence of acute cardiopulmonary disease.  He was admitted for sepsis secondary to likely aspiration pneumonia with concomitant UTI and he is improving.  The night before last he had mulitple bowel movements and diarrhea but he is Afebrile, has no Leukocytosis, and is on TF and Abx. Will need to continue to Monitor and if continues to worsen  and if he spikes a temperature and worsens WBC will work up for Infectious Diarrhea.  Diarrhea is improving slightly.  Assessment & Plan:   Principal Problem:   Sepsis due to undetermined organism [facility]) Active Problems:   Lactic acidosis   GERD (gastroesophageal reflux disease)   Type 2 diabetes mellitus with hyperglycemia (HCC)   Hyperlipidemia   Transaminitis   Moderate protein malnutrition (HCC)   Pressure injury of skin   Protein-calorie malnutrition, severe   Aspiration into airway  Severe sepsis secondary to likely aspiration pneumonia with concomitant catheter associated Klebsiella and Pseudomonas UTI, present on admission -He was placed in the stepdown unit -Met sepsis criteria on admission as he is febrile, tachypneic, tachycardic, had a leukocytosis as well as a source of infection as well as a new O2 requirement -He was given IV fluid hydration with LR at 150 MLS per hour which is now stopped after 20 hours -Lactic acid was elevated at 3.1 and then trended down to 1.8 with fluid resuscitation -Patient has a chronic Foley since his last admission and he was unable to have it discontinued at the last hospitalization; he presented with a Foley and so this was changed yesterday but did show signs of an infection -WBC has trended down from 15.3 -> 9.5 -> 6.3 -> 7.1 and went to 9.2 -> 9.4 -Blood cultures x2 show no growth to date at 5 Days  -He had a urinalysis done which showed a cloudy appearance with amber color urine, large hemoglobin, moderate leukocytes, many bacteria, greater than 50 RBCs per high-power field, greater  than 50 WBCs and a urine culture that shows greater than 100,000 colony-forming unit of gram-negative rods and showing >100,000 of Klebsiella Pneumoniae and >100,000 of Pseudomonas Aeruginosa  with Sensitivities sensitive to IV Zosyn -Change antibiotics to IV Zosyn 09/29/21 and will continue for now -We will obtain SLP evaluation and they did a modified barium  swallow; after MBS they are recommending ice chips as needed after oral care and free water protocol after oral care and recommending continue n.p.o. -Continue to follow cultures and continue Abx as above -Added Guaifenesin 5 mL every 4 hours to loosen cough and some Tussionex for cough suppression -Continue to monitor temperature curve and WBC and follow-up on the cultures  Acute respiratory failure with hypoxia in the setting of aspiration from his vomit -Noted to have an O2 saturation in the 80s on arrival to the ED had to be placed on a nonrebreather -SpO2: 100 % O2 Flow Rate (L/min): 1 L/min -Now was weaned off of supplemental oxygen via nasal cannula -Continuous pulse oximetry maintain O2 saturation greater than 90% -Continue supplemental oxygen via nasal cath warranted and wean to room air as able -Repeat CXR this AM showed "There is interval increase in interstitial markings in the mid and lower lung fields suggesting interstitial edema or interstitial pneumonitis. Part of this finding may be due to poor inspiration. Linear densities in the left mid and right lower lung fields suggest subsegmental atelectasis. Possible minimal left pleural effusion." -We will add Tussionex as above  -We will add Xopenex/Atrovent every 6 as needed for wheezing and shortness of breath  Diarrhea improving -In the setting of Tube Feedings and Abx with IV Zosyn  -Has had multiple bowel movements overnight -Continue to Monitor and will try Anti-Diarrheals and consider Infectious Workup if continues to Worsen   Pressure ulcer -Present on admission -Continue with local and preventive measures and will consult wound care for further evaluation -Continue with Gerhardt's Butt cream topically 2 times daily  GERD/GI prophylaxis -Continue with PPI with Lansoprazole 15 mg per tube  Diabetes mellitus type 2 with hyperglycemia -Continue with sensitive NovoLog sliding scale insulin every 4h -Continue to monitor  blood sugars carefully; CBG's have been ranging from 111-150  Dysphagia in the setting of CVA -Continue tube feedings with Jevity at 60 mL/h continuous along with Prosource plus tube feedings 45 mL per tube twice daily and 150 mL free water flushes every 4h -Have consulted SLP for further evaluation and MBS done  Normocytic Anemia -Patient's hemoglobin/hematocrit went from 13.6/42.6 and trended down to 10.9/33.8 -> 10.6/33.4 -> 10.5/32.6 -> 11.7/37.0 -> 12.4/38.1 -> 12.9/40.2 -Likely dilutional drop in the setting of IV fluid resuscitation but now IVF stopped -Checked Anemia Panel and showed an iron level of 50, U IBC of 202, TIBC of 252, saturation ratios of 20%, ferritin level 312, folate level 10.7, vitamin B12 of 570 -Continue to monitor for signs and symptoms of bleeding; currently no overt bleeding noted -Repeat CBC in a.m.  Hypokalemia  -Improved.  Last potassium was 3.8 -Continue to Monitor and Replete as Necessary -Repeat CMP in the AM   Hyponatremia -Mild. Na+ went from 136 -> 133 -> 135 -> 134 x3 -Continue to Monitor and Trend -Repeat CMP in the AM   Hypophosphatemia -Patient's Phos Level was 2.3 and improved to 2.9 x2 -Continue to Monitor and Trend -Repeat Phos Level in the AM  Urinary Retention and ? Neurogenic Bladder -Had a foley placed last hopsitalizaion and was unable to have it removed due  to retention -May discuss with Urology here and Try TOV while hospitalized but will do so when he is more ambulatory   Hyperlipidemia  -Holding statin in the setting of his transaminitis and abnormal LFTs  Abnormal LFTs -Improved and likely in the setting of sepsis initially but now slightly worsened  -AST went from 46 and trended down to 33 -> 37 -> 44 -> 47 -> 54 -> 42 -ALT went from 78 and trended down to 49 -> 46 -> 52 -> 62 -> 76 -> 71 -Continue to monitor and trend hepatic function carefully -Obtained a right upper quadrant ultrasound which showed "Gallbladder  sludge, otherwise unremarkable right upper quadrant ultrasound." -Acute hepatitis panel Negative  -Repeat CMP in a.m.  Generalized weakness and deconditioning in the setting of his acute CVA and prolonged hospitalization -Has some residual left hemiplegia -Resume ASA and Atorvastatin  -We will consult PT and OT to further evaluate and treat and they are recommending Inpatient Rehab -Rehabs Coordinator consulted and feel that he could be a potential candidate though Insurance Auth may be difficult; CIR pursuing potential   Hypoalbuminemia -T Bili went from 1.9 -> 2.0 -> 2.2 x2 -Continue to Monitor and trend -Repeat CMP in the AM  Severe malnutrition in the context of acute illness/injury -Nutritionist has been consulted for further evaluation recommendations -Have ordered the patient Jevity 1.5 x 30 mL/h to advance by 10 mils per hour every 8 hours to reach a goal rate of 60 mils per hour with 45 mL of Prosource TF twice daily and 150 mL free water flushes every 4 hours -They are recommending slow advancement due to concern for aspiration -SLP consulted for MBS and recommending Ice Chips   DVT prophylaxis: SCDs; will consider pharmacological prophylaxis in the morning with enoxaparin Code Status: FULL CODE Family Communication: Discussed with wife at bedside  Disposition Plan: Pending further clinical improvement and evaluation by PT and OT as well as SLP; Potentially CIR for Rehab and pending bed availability   Status is: Inpatient   The patient will require care spanning > 2 midnights and should be moved to inpatient because: Patient was meeting sepsis criteria and is improving however still not back to his baseline.  SLP is evaluating and planning for an MBS in the morning and PT OT will still need to see the patient for further evaluation and recommending Inpatient Rehab.   Consultants:  None  Procedures: None  Antimicrobials:  Anti-infectives (From admission, onward)     Start     Dose/Rate Route Frequency Ordered Stop   09/29/21 1200  piperacillin-tazobactam (ZOSYN) IVPB 3.375 g        3.375 g 12.5 mL/hr over 240 Minutes Intravenous Every 8 hours 09/29/21 1037     09/28/21 1200  Ampicillin-Sulbactam (UNASYN) 3 g in sodium chloride 0.9 % 100 mL IVPB  Status:  Discontinued        3 g 200 mL/hr over 30 Minutes Intravenous Every 6 hours 09/28/21 1012 09/29/21 1037   09/27/21 1700  ceFEPIme (MAXIPIME) 2 g in sodium chloride 0.9 % 100 mL IVPB  Status:  Discontinued        2 g 200 mL/hr over 30 Minutes Intravenous Every 8 hours 09/27/21 0848 09/28/21 1012   09/27/21 0815  ceFEPIme (MAXIPIME) 2 g in sodium chloride 0.9 % 100 mL IVPB        2 g 200 mL/hr over 30 Minutes Intravenous  Once 09/27/21 0800 09/27/21 0943   09/27/21 0645  cefTRIAXone (  ROCEPHIN) 2 g in sodium chloride 0.9 % 100 mL IVPB  Status:  Discontinued        2 g 200 mL/hr over 30 Minutes Intravenous Every 24 hours 09/27/21 0642 09/27/21 0800   09/27/21 0645  azithromycin (ZITHROMAX) 500 mg in sodium chloride 0.9 % 250 mL IVPB  Status:  Discontinued        500 mg 250 mL/hr over 60 Minutes Intravenous Every 24 hours 09/27/21 8110 09/27/21 0801        Subjective: Seen and examined at bedside and is doing okay.  Had no complaints today.  No nausea or vomiting.  Denies any chest pain or shortness of breath.  States his bowel movements have improved and he is not coughing as much.  Objective: Vitals:   10/02/21 2021 10/03/21 0407 10/03/21 0421 10/03/21 1021  BP: 126/85 129/75  (!) 100/57  Pulse: (!) 105 95  79  Resp: _0 Temp: 98.2 F (36.8 C) 97.9 F (36.6 C)  98.7 F (37.1 C)  TempSrc: Oral Oral  Oral  SpO2: 92% 96%  100%  Weight:   80.6 kg   Height:        Intake/Output Summary (Last 24 hours) at 10/03/2021 1227 Last data filed at 10/03/2021 1038 Gross per 24 hour  Intake 1312.79 ml  Output 2600 ml  Net -1287.21 ml    Filed Weights   10/01/21 0521 10/02/21 0437 10/03/21  0421  Weight: 87 kg 84.3 kg 80.6 kg   Examination: Physical Exam:  Constitutional: The patient is a chronically ill-appearing Caucasian male, in no acute distress appears calm Eyes: Lids and conjunctivae normal, sclerae anicteric  ENMT: External Ears, Nose appear normal. Grossly normal hearing. Mucous membranes are moist.  Neck: Appears normal, supple, no cervical masses, normal ROM, no appreciable thyromegaly; no appreciable JVD Respiratory: Mildly diminished to auscultation bilaterally with coarse breath sounds, no wheezing, rales, rhonchi or crackles. Normal respiratory effort and patient is not tachypenic.  Unlabored breathing Cardiovascular: RRR, no murmurs / rubs / gallops. S1 and S2 auscultated. Trace Lower Extremity edema Abdomen: Soft, non-tender, non-distended. PEG tube is in place. Bowel sounds positive.  GU: Deferred. Musculoskeletal: No clubbing / cyanosis of digits/nails. No joint deformity upper and lower extremities. Skin: No rashes, lesions on a limited skin evaluation and I did not turn him to view Sacral ulcer. No induration; Warm and dry.  Neurologic: CN 2-12 grossly intact with no focal deficits. Romberg sign and cerebellar reflexes not assessed.  Psychiatric: Normal judgment and insight. Alert and oriented x 3. Normal mood and appropriate affect.   Data Reviewed: I have personally reviewed following labs and imaging studies  CBC: Recent Labs  Lab 09/29/21 0509 09/30/21 0552 10/01/21 0608 10/02/21 0937 10/03/21 0513  WBC 6.3 6.8 7.1 9.2 9.4  NEUTROABS 3.8 3.8 3.7 5.0 4.5  HGB 10.6* 10.5* 11.7* 12.4* 12.9*  HCT 33.4* 32.6* 37.0* 38.1* 40.2  MCV 89.3 90.1 89.6 87.6 88.7  PLT 188 184 222 251 315    Basic Metabolic Panel: Recent Labs  Lab 09/29/21 0509 09/29/21 1646 09/30/21 0552 10/01/21 0608 10/02/21 0937 10/03/21 0513  NA 136  --  133* 135 134* 134*  K 3.4*  --  3.5 4.1 4.1 3.8  CL 105  --  102 101 102 102  CO2 25  --  _1 GLUCOSE 112*   --  129* 131* 127* 131*  BUN 21  --  _2 20  CREATININE 0.63  --  0.41* 0.59* 0.49* 0.61  CALCIUM 8.0*  --  7.5* 8.1* 8.3* 8.2*  MG 1.9 1.9 1.9 2.1 2.1 2.1  PHOS 3.4 3.3 2.4* 2.3* 2.9 2.9    GFR: Estimated Creatinine Clearance: 97 mL/min (by C-G formula based on SCr of 0.61 mg/dL). Liver Function Tests: Recent Labs  Lab 09/29/21 0509 09/30/21 0552 10/01/21 0608 10/02/21 0937 10/03/21 0513  AST 37 44* 47* 54* 42*  ALT 46* 52* 62* 76* 71*  ALKPHOS 134* 133* 141* 154* 141*  BILITOT 0.7 0.4 0.4 0.3 0.5  PROT 5.6* 5.3* 5.8* 6.2* 6.2*  ALBUMIN 2.1* 1.9* 2.0* 2.2* 2.2*    No results for input(s): LIPASE, AMYLASE in the last 168 hours. No results for input(s): AMMONIA in the last 168 hours. Coagulation Profile: Recent Labs  Lab 09/27/21 0648  INR 1.1    Cardiac Enzymes: No results for input(s): CKTOTAL, CKMB, CKMBINDEX, TROPONINI in the last 168 hours. BNP (last 3 results) No results for input(s): PROBNP in the last 8760 hours. HbA1C: No results for input(s): HGBA1C in the last 72 hours. CBG: Recent Labs  Lab 10/02/21 1604 10/02/21 2018 10/02/21 2351 10/03/21 0408 10/03/21 0743  GLUCAP 117* 150* 143* 137* 111*    Lipid Profile: No results for input(s): CHOL, HDL, LDLCALC, TRIG, CHOLHDL, LDLDIRECT in the last 72 hours. Thyroid Function Tests: No results for input(s): TSH, T4TOTAL, FREET4, T3FREE, THYROIDAB in the last 72 hours. Anemia Panel: Recent Labs    10/03/21 0513  VITAMINB12 517  FOLATE 10.7  FERRITIN 312  TIBC 252  IRON 50  RETICCTPCT 3.7*   Sepsis Labs: Recent Labs  Lab 09/27/21 0648 09/27/21 0803 09/27/21 1000 09/27/21 1620  LATICACIDVEN 3.0* 3.1* 2.6* 1.8     Recent Results (from the past 240 hour(s))  Blood Culture (routine x 2)     Status: None   Collection Time: 09/27/21  6:47 AM   Specimen: BLOOD  Result Value Ref Range Status   Specimen Description   Final    BLOOD BLOOD RIGHT FOREARM Performed at [facility] [address], [place], [place] [address]    Special Requests   Final    BOTTLES DRAWN AEROBIC AND ANAEROBIC Blood Culture adequate volume Performed at [facility] [address], [place], [place] [address]    Culture   Final    NO GROWTH 5 DAYS Performed at [facility], [place] [address] [place], [place] [address]    Report Status 10/02/2021 FINAL  Final  Resp Panel by RT-PCR (Flu A&B, Covid) Nasopharyngeal Swab     Status: None   Collection Time: 09/27/21  6:48 AM   Specimen: Nasopharyngeal Swab; Nasopharyngeal(NP) swabs in vial transport medium  Result Value Ref Range Status   SARS Coronavirus 2 by RT PCR NEGATIVE NEGATIVE Final    Comment: (NOTE) SARS-CoV-2 target nucleic acids are NOT DETECTED.  The SARS-CoV-2 RNA is generally detectable in upper respiratory specimens during the acute phase of infection. The lowest concentration of SARS-CoV-2 viral copies this assay can detect is 138 copies/mL. A negative result does not preclude SARS-Cov-2 infection and should not be used as the sole basis for treatment or other patient management decisions. A negative result may occur with  improper specimen collection/handling, submission of specimen other than nasopharyngeal swab, presence of viral mutation(s) within the areas targeted by this assay, and inadequate number of viral copies(<138 copies/mL). A negative result must be combined with clinical observations, patient  history, and epidemiological information. The expected result is Negative.  Fact Sheet for Patients:  EntrepreneurPulse.com.au  Fact Sheet for Healthcare Providers:  IncredibleEmployment.be  This test is no t yet approved or cleared by the [place] FDA and  has been authorized for detection and/or diagnosis of SARS-CoV-2 by FDA under an Emergency Use Authorization (EUA). This EUA will remain  in effect (meaning this test can be used) for  the duration of the COVID-19 declaration under Section 564(b)(1) of the Act, 21 U.S.C.section 360bbb-3(b)(1), unless the authorization is terminated  or revoked sooner.       Influenza A by PCR NEGATIVE NEGATIVE Final   Influenza B by PCR NEGATIVE NEGATIVE Final    Comment: (NOTE) The Xpert Xpress SARS-CoV-2/FLU/RSV plus assay is intended as an aid in the diagnosis of influenza from Nasopharyngeal swab specimens and should not be used as a sole basis for treatment. Nasal washings and aspirates are unacceptable for Xpert Xpress SARS-CoV-2/FLU/RSV testing.  Fact Sheet for Patients: EntrepreneurPulse.com.au  Fact Sheet for Healthcare Providers: IncredibleEmployment.be  This test is not yet approved or cleared by the [place] FDA and has been authorized for detection and/or diagnosis of SARS-CoV-2 by FDA under an Emergency Use Authorization (EUA). This EUA will remain in effect (meaning this test can be used) for the duration of the COVID-19 declaration under Section 564(b)(1) of the Act, 21 U.S.C. section 360bbb-3(b)(1), unless the authorization is terminated or revoked.  Performed at [facility], [place] [address], [place], [place] [address]   Urine Culture     Status: Abnormal   Collection Time: 09/27/21  6:48 AM   Specimen: In/Out Cath Urine  Result Value Ref Range Status   Specimen Description   Final    IN/OUT CATH URINE Performed at [facility] [address], [place], [place] [address]    Special Requests   Final    NONE Performed at [facility], [place] [address] [place], [place] [address]    Culture (A)  Final    >=100,000 COLONIES/mL KLEBSIELLA PNEUMONIAE >=100,000 COLONIES/mL PSEUDOMONAS AERUGINOSA    Report Status 09/30/2021 FINAL  Final   Organism ID, Bacteria KLEBSIELLA PNEUMONIAE (A)  Final   Organism ID, Bacteria PSEUDOMONAS AERUGINOSA (A)  Final       Susceptibility   Klebsiella pneumoniae - MIC*    AMPICILLIN >=32 RESISTANT Resistant     CEFAZOLIN <=4 SENSITIVE Sensitive     CEFEPIME <=0.12 SENSITIVE Sensitive     CEFTRIAXONE <=0.25 SENSITIVE Sensitive     CIPROFLOXACIN <=0.25 SENSITIVE Sensitive     GENTAMICIN <=1 SENSITIVE Sensitive     IMIPENEM <=0.25 SENSITIVE Sensitive     NITROFURANTOIN 64 INTERMEDIATE Intermediate     TRIMETH/SULFA <=20 SENSITIVE Sensitive     AMPICILLIN/SULBACTAM 4 SENSITIVE Sensitive     PIP/TAZO <=4 SENSITIVE Sensitive     * >=100,000 COLONIES/mL KLEBSIELLA PNEUMONIAE   Pseudomonas aeruginosa - MIC*    CEFTAZIDIME 4 SENSITIVE Sensitive     CIPROFLOXACIN <=0.25 SENSITIVE Sensitive     GENTAMICIN 2 SENSITIVE Sensitive     IMIPENEM 2 SENSITIVE Sensitive     PIP/TAZO 8 SENSITIVE Sensitive     CEFEPIME 2 SENSITIVE Sensitive     * >=100,000 COLONIES/mL PSEUDOMONAS AERUGINOSA  Blood Culture (routine x 2)     Status: None   Collection Time: 09/27/21  6:53 AM   Specimen: BLOOD  Result Value Ref Range Status   Specimen Description   Final  BLOOD BLOOD LEFT FOREARM Performed at [facility] [address], [place], [place] [address]    Special Requests   Final    BOTTLES DRAWN AEROBIC AND ANAEROBIC Blood Culture results may not be optimal due to an inadequate volume of blood received in culture bottles Performed at [facility] [address], [place], [place] [address]    Culture   Final    NO GROWTH 5 DAYS Performed at [facility], [place] [address] [place], [place] [address]    Report Status 10/02/2021 FINAL  Final  MRSA Next Gen by PCR, Nasal     Status: Abnormal   Collection Time: 09/27/21 12:34 PM   Specimen: Nasal Mucosa; Nasal Swab  Result Value Ref Range Status   MRSA by PCR Next Gen DETECTED (A) NOT DETECTED Final    Comment: RESULT CALLED TO, READ BACK BY AND VERIFIED WITH:  AMANDA WATSON RN 09/27/21 @ 7169 VS (NOTE) The GeneXpert MRSA Assay  (FDA approved for NASAL specimens only), is one component of a comprehensive MRSA colonization surveillance program. It is not intended to diagnose MRSA infection nor to guide or monitor treatment for MRSA infections. Test performance is not FDA approved in patients less than 68 years old. Performed at [facility], [place] [address], [place], [place] [address]      RN Pressure Injury Documentation: Pressure Injury 09/27/21 Coccyx Medial Stage 2 -  Partial thickness loss of dermis presenting as a shallow open injury with a red, pink wound bed without slough. stage 2, 1.5X.6 (Active)  09/27/21 1300  Location: Coccyx  Location Orientation: Medial  Staging: Stage 2 -  Partial thickness loss of dermis presenting as a shallow open injury with a red, pink wound bed without slough.  Wound Description (Comments): stage 2, 1.5X.6  Present on Admission: Yes     Pressure Injury Buttocks Left;Anterior;Medial Deep Tissue Pressure Injury - Purple or maroon localized area of discolored intact skin or blood-filled blister due to damage of underlying soft tissue from pressure and/or shear. 1.5X1 inner buttock beside s (Active)     Location: Buttocks  Location Orientation: Left;Anterior;Medial  Staging: Deep Tissue Pressure Injury - Purple or maroon localized area of discolored intact skin or blood-filled blister due to damage of underlying soft tissue from pressure and/or shear.  Wound Description (Comments): 1.5X1 inner buttock beside stage 2  Present on Admission: Yes    Estimated body mass index is 24.1 kg/m as calculated from the following:   Height as of this encounter: 6' (1.829 m).   Weight as of this encounter: 80.6 kg.  Malnutrition Type: Nutrition Problem: Severe Malnutrition Etiology: acute illness (fall on 9/26 with subsequent hemorrages and strokes) Malnutrition Characteristics: Signs/Symptoms: moderate fat depletion, moderate muscle depletion, severe muscle depletion,  percent weight loss Percent weight loss: 15 % Nutrition Interventions: Interventions: Tube feeding, Prostat Radiology Studies: US Abdomen Limited RUQ (LIVER/GB)  Result Date: 10/02/2021 CLINICAL DATA:  Abnormal LFTs. EXAM: ULTRASOUND ABDOMEN LIMITED RIGHT UPPER QUADRANT COMPARISON:  Right upper quadrant ultrasound dated 09/17/2021. FINDINGS: Gallbladder: There is tumefactive sludge within the gallbladder. No shadowing stone. No gallbladder wall thickening or pericholecystic fluid. Negative sonographic Murphy's sign. Common bile duct: Diameter: 3 mm Liver: No focal lesion identified. Within normal limits in parenchymal echogenicity. Portal vein is patent on color Doppler imaging with normal direction of blood flow towards the liver. Other: None. IMPRESSION: Gallbladder sludge, otherwise unremarkable right upper quadrant ultrasound. Electronically Signed   By: Milas Hock  Radparvar M.D.   On: 10/02/2021 20:27    Scheduled Meds:  aspirin  81 mg Per Tube q AM   Chlorhexidine Gluconate Cloth  6 each Topical Daily   diclofenac Sodium  2 g Topical QID   feeding supplement (PROSource TF)  45 mL Per Tube BID   free water  150 mL Per Tube Q4H   Gerhardt's butt cream   Topical BID   insulin aspart  0-9 Units Subcutaneous Q4H   mouth rinse  15 mL Mouth Rinse BID   pantoprazole sodium  40 mg Per Tube Daily   simvastatin  40 mg Per Tube QPM   Continuous Infusions:  feeding supplement (JEVITY 1.5 CAL/FIBER) 948 mL (10/02/21 2335)   piperacillin-tazobactam 3.375 g (10/03/21 0546)    LOS: 5 days   Kerney Elbe, DO [facility] PAGER is on [place]  If 7PM-7AM, please contact night-coverage [email or web address]

## 2021-10-03 NOTE — Progress Notes (Signed)
Speech Language Pathology Treatment: Dysphagia  Patient Details Name: Raymond Stuart MRN: [id number] DOB: 05/19/1951 Today's Date: 10/03/2021 Time: 1810-1830 SLP Time Calculation (min) (ACUTE ONLY): 20 min  Assessment / Plan / Recommendation Clinical Impression  Pt seen for dysphagia treatment.  He is alert and wife is present but pt is reporting significant discomfort on his bottom.  SLP called for repositioning assistance and obtained ice chips, lemon lime soda, thickener, oral suction cathter with cover, extra tubing for suction.  After assist with repositoning and RN changing out pt's sacral bandage, pt did not tolerate sitting fuly upright.  Pt brushe dhis teeth with assist. SlP did not provide pt with intake due to poor positioning.  Pt benefited from moderate cues to speak loudly= to strengthen his voice for maximal airway protection. Using teach back, he and his wife advised understanding to recommendations.  SlP inquired if pt was taking in ice chips or swishing and expectorating water - he and his wife stated he only took a few ice chips in the morning but none in the pm.  Using teach back, reviewed importance of oral care but that pt needs to consume ice chips during the day to strengthen his swallow.  SlP will initiate RMST with pt on 11/9 to improve laryngeal closure/airway protection for swallowing.   HPI HPI: Raymond Rodriques is a 70 y.o. male with medical history significant for GERD hyperlipidemia, osteoarthritis, urolithiasis who was recently admitted to [facility] (9/26-29) after falling, hitting his head on concrete and sustaining a large right frontal intraparenchymal hemorrhage, large intraventricular hemorrhage, SDH and right occipital bone fx. PEG 10/27.  D/Cd home, 9/30 admitted to [facility] then D/Cd  to SNF for rehab on 11/1.  Pt was transferred from his facility after being there for less than 24 hours to [facility] after being found covered in vomit, febrile at 101.2 F,, tachypneic/dyspneic and  hypoxic in the 80s.  Per Md notes, "he responded to [facility] oxygen at 10 LPM then subsequently brought to the emergency department.  At the facility, he was started getting tube feeding around 2300 last night.  He has been bedbound.  A significant portion of the history is taken from his wife, but he was able to say a few things and answer simple questions."  Swallow eval ordered.  Pt has hx of dysphagia diagnosed initially 10/4 and mech soft diet ordered, mentation waxed/waned and pt was made npo with Dobhoff.  MBS then conducted 10/19 and 11/1 - recommendation was npo x frazier water protocol.  Large barrier to adequate po intake has been his mentation.    Recs from 11/1 MBS were npo and ice chips with aggressive SLP at next venue of care.  11/3 clinical swallow evaluation recommended NPO except ice chips and repeat MBS. Repeat MBS completed 11/4 with recommendation to continue NPO except for ice/water PRN and nectar thick liquid trials with SLP only.      SLP Plan  Continue with current plan of care      Recommendations for follow up therapy are one component of a multi-disciplinary discharge planning process, led by the attending physician.  Recommendations may be updated based on patient status, additional functional criteria and insurance authorization.    Recommendations  Diet recommendations:  (npo x ice chips, swish and expecorate with water, nectar thick liquids ok with SLP) Medication Administration: Via alternative means Compensations: Minimize environmental distractions Postural Changes and/or Swallow Maneuvers: Seated upright 90 degrees  Oral Care Recommendations: Oral care QID;Oral care prior to ice chip/H20;Staff/trained caregiver to provide oral care Follow Up Recommendations: Acute inpatient rehab (3hours/day) Assistance recommended at discharge: Frequent or constant Supervision/Assistance SLP Visit Diagnosis: Dysphagia, oropharyngeal phase (R13.12) Plan:  Continue with current plan of care       GO              Rolena Infante, MS [facility] SLP Acute Rehab Services Office [phone] Pager [phone]   Chales Abrahams  10/03/2021, 10:25 PM

## 2021-10-03 NOTE — Progress Notes (Signed)
Inpatient Rehab Admissions Coordinator:   Notified by Fransico Him of request for peer to peer and Dr. Marland Mcalpine completed.  [facility] MD asked for updated PT notes and I've spoken to the therapy team who will see him this afternoon.  Will continue to follow.    Estill Dooms, PT, DPT Admissions Coordinator [phone] 10/03/21  2:36 PM

## 2021-10-03 NOTE — Progress Notes (Signed)
Physical Therapy Treatment Patient Details Name: Raymond Stuart MRN: [id number] DOB: 06/08/1951 Today's Date: 10/03/2021   History of Present Illness 70 y.o. male admitted for sepsis secondary likely aspiration pnuemonia with UTI. H/O TBI, intraparenchymal hemorrhage, ischemic stroke and admitted to [facility].in ~07/2021  The day after his discharge, he was admitted to [facility] with fever,with  a prolonged, 31-day hospitalization.  S/P  G-tube placed , He was ultimately discharged to skilled nursing facility 09/26/2021. On 09/27/2021 He had an episode of emesis, witnessed aspiration event.  He was sent to the ED with a new oxygen requirement and fever.   PMH significant for GERD, OA, .    PT Comments    The patient expressed verbally ,with some expressive difficulty,  that he Is frustrated that people come and go frequently to his room, that he wants pain medication prior to therapy. (Patient had tylenol ahead)Patient informed that notes will be in his chart for therapy to communicate with RN prior to therapy.  After assisting to sitting, worked with patient on increasing weight shifted to the  left side. Placed 2 pillows on Left side and had patient lean down onto forearm to facilitate weight shift. Patient sat x ~ 4 minutes . Patient then able to sit upright at midline with only RUE supporting.  Placed RW infront and front bar to the patient. Patient reached each hand to grip bar and   held onto bar and pushed RW forward x 10 reps to increase forward trunk and hip flexion. PT assisting.Patient  tolerated well. Patient and wife express satisfaction on progress today. Did not atempt standing at [facility] due to patient complaints of the LLE pain. Patient  can tolerate increased therapy and is a good CIR candidate to improve function and decrease caregiver burden.  Recommendations for follow up therapy are one component of a multi-disciplinary discharge planning process, led by the attending  physician.  Recommendations may be updated based on patient status, additional functional criteria and insurance authorization.  Follow Up Recommendations  Acute inpatient rehab (3hours/day)     Assistance Recommended at Discharge Frequent or constant Supervision/Assistance  Equipment Recommendations   (TBD)    Recommendations for Other Services Rehab consult     Precautions / Restrictions Precautions Precautions: Fall Precaution Comments: g-tube RLQ, catheter, orthostatic, left hip pain     Mobility  Bed Mobility Overal bed mobility: Needs Assistance Bed Mobility: Rolling;Supine to Sit;Sit to Sidelying Rolling: Max assist   Supine to sit: Max assist;+2 for physical assistance;+2 for safety/equipment;HOB elevated     General bed mobility comments: Patient initiated moving right leg, pain with left so assisted. Once  legs at edge, total assist and use of bed  pad to sit upright on bed edge. strong tilting to the right, holding onto rail  To return to sidelying, requires total assistance of 2 for trunk and legs.    Transfers                   General transfer comment: patient  reports left hip pain too much when attempted to place  feet onto STEDY    Ambulation/Gait                   Stairs             Wheelchair Mobility    Modified Rankin (Stroke Patients Only)       Balance Overall balance assessment: Needs assistance Sitting-balance support: No upper extremity supported;Feet unsupported Sitting  balance-Leahy Scale: Poor Sitting balance - Comments: Initially strong lean to right and posterior. Gradually able to let go of the rail and lean more forward and to the right. Patient sat x ~ 20 minutes  with intermittent external support or UE support. Postural control: Right lateral lean                                  Cognition Arousal/Alertness: Awake/alert Behavior During Therapy: Flat affect;Anxious Overall Cognitive  Status: Within Functional Limits for tasks assessed Area of Impairment: Orientation                   Current Attention Level: Sustained   Following Commands: Follows one step commands with increased time;Follows multi-step commands inconsistently   Awareness: Emergent   General Comments: A&O x4, patient expresses that he would like therapy aftr he has been medicated, would like to be scheduled. Exoplained therapy will make every effort .        Exercises General Exercises - Lower Extremity Ankle Circles/Pumps: AROM;Both;10 reps Long Arc Quad: AROM;AAROM;Both;10 reps;Seated    General Comments        Pertinent Vitals/Pain Faces Pain Scale: Hurts even more Pain Location: L hip and knee Pain Descriptors / Indicators: Aching;Discomfort;Grimacing;Moaning Pain Intervention(s): Monitored during session;Premedicated before session;Repositioned;Limited activity within patient's tolerance    Home Living                          Prior Function            PT Goals (current goals can now be found in the care plan section) Acute Rehab PT Goals Potential to Achieve Goals: Good Progress towards PT goals: Progressing toward goals    Frequency    Min 3X/week      PT Plan Current plan remains appropriate    Co-evaluation              AM-PAC PT "6 Clicks" Mobility   Outcome Measure  Help needed turning from your back to your side while in a flat bed without using bedrails?: Total Help needed moving from lying on your back to sitting on the side of a flat bed without using bedrails?: Total Help needed moving to and from a bed to a chair (including a wheelchair)?: Total Help needed standing up from a chair using your arms (e.g., wheelchair or bedside chair)?: Total Help needed to walk in hospital room?: Total Help needed climbing 3-5 steps with a railing? : Total 6 Click Score: 6    End of Session   Activity Tolerance: Patient tolerated treatment  well;Patient limited by pain Patient left: in bed;with call bell/phone within reach;with bed alarm set;with family/visitor present Nurse Communication: Mobility status;Need for lift equipment PT Visit Diagnosis: Difficulty in walking, not elsewhere classified (R26.2);Muscle weakness (generalized) (M62.81);Pain;Unsteadiness on feet (R26.81) Pain - Right/Left: Left Pain - part of body: Hip     Time: 1410-1440 PT Time Calculation (min) (ACUTE ONLY): 30 min  Charges:  $Therapeutic Activity: 23-37 mins                     Blanchard Kelch PT Acute Rehabilitation Services Pager [phone] Office [phone]    Rada Hay 10/03/2021, 3:50 PM

## 2021-10-04 DIAGNOSIS — A419 Sepsis, unspecified organism: Secondary | ICD-10-CM | POA: Diagnosis not present

## 2021-10-04 LAB — GLUCOSE, CAPILLARY
Glucose-Capillary: 109 mg/dL — ABNORMAL HIGH (ref 70–99)
Glucose-Capillary: 122 mg/dL — ABNORMAL HIGH (ref 70–99)
Glucose-Capillary: 125 mg/dL — ABNORMAL HIGH (ref 70–99)
Glucose-Capillary: 131 mg/dL — ABNORMAL HIGH (ref 70–99)
Glucose-Capillary: 139 mg/dL — ABNORMAL HIGH (ref 70–99)
Glucose-Capillary: 154 mg/dL — ABNORMAL HIGH (ref 70–99)

## 2021-10-04 MED ORDER — JUVEN PO PACK
1.0000 | PACK | Freq: Two times a day (BID) | ORAL | Status: DC
  Administered 2021-10-04 – 2021-10-10 (×12): 1
  Filled 2021-10-04 (×13): qty 1

## 2021-10-04 MED ORDER — NYSTATIN 100000 UNIT/ML MT SUSP
5.0000 mL | Freq: Four times a day (QID) | OROMUCOSAL | Status: DC
  Administered 2021-10-04 – 2021-10-10 (×24): 500000 [IU] via ORAL
  Filled 2021-10-04 (×20): qty 5

## 2021-10-04 NOTE — Plan of Care (Signed)

## 2021-10-04 NOTE — Progress Notes (Signed)
PROGRESS NOTE    Raymond Stuart  PHX:[id number] DOB: 1951/06/27 DOA: 09/27/2021 PCP: Caffie Damme, MD    Chief Complaint  Patient presents with   Aspiration    Pt was feed last night by peg tube at 2300, found this am laying flat, vomiting, now is tachypnic and has a high heart rate    Brief Narrative:  admitted to [facility] and then [facility] after having a subdural hematoma, traumatic intraparenchymal hemorrhage, ventricular hemorrhage in the setting of acute head injury with occipital fracture, ischemic stroke followed by aspiration pneumonia in setting of stroke dysphagia status post PEG placement, transaminitis, AKI, acute urinary tension with indwelling Foley catheter was then transferred from his rehab facility yesterday morning after being found covered in vomit and febrile with 101.2 as well as being dyspneic and hypoxic in the 80s.  He responded to nonrebreather oxygen at 10L/min and then subsequently was brought to the ED.  At the facility he was started getting tube feedings around 11 PM and he has been bedbound since his stroke.   Subjective:  Alert and interactive, no oriented to the months but knows the year, knows he is in the hospital Per wife he was fully functional prior to [place], he was only stayed at the SNF for 8hrs then developed aspiration and admitted to [facility] long   He remains npo, He is tolerating feeding tube Mild thrush on tongue   Assessment & Plan:   Principal Problem:   Sepsis due to undetermined organism [facility]) Active Problems:   Lactic acidosis   GERD (gastroesophageal reflux disease)   Type 2 diabetes mellitus with hyperglycemia (HCC)   Hyperlipidemia   Transaminitis   Moderate protein malnutrition (HCC)   Pressure injury of skin   Protein-calorie malnutrition, severe   Aspiration into airway  Sepsis/acute hypoxic respiratory failure present on admission due to aspiration pneumonia/catheter associated  Klebsiella and Pseudomonas UTI -Improving, on room air -Plan for total 7 days of IV Zosyn  Diarrhea reported by previous attending Thought due to from tube feeding and IV Zosyn Monitor  Dysphagia from CVA Continue recommend n.p.o. by speech Tolerating tube feeds  Urinary retention possible neurogenic bladder Need to exchange Foley if is not done previously   Mild elevation of lft Hepatitis panel negative monitor  FTT: reported improving per family, family desires CIR placement  Nutritional Assessment:  The patient's BMI is: Body mass index is 25.59 kg/m.Marland Kitchen  Seen by dietician.  I agree with the assessment and plan as outlined below:  Nutrition Status: Nutrition Problem: Severe Malnutrition Etiology: acute illness (fall on 9/26 with subsequent hemorrages and strokes) Signs/Symptoms: moderate fat depletion, moderate muscle depletion, severe muscle depletion, percent weight loss Percent weight loss: 15 % Interventions: Tube feeding, Prostat  .     Skin Assessment:  I have examined the patient's skin and I agree with the wound assessment as performed by the wound care RN as outlined below:  Pressure Injury 09/27/21 Coccyx Medial Stage 2 -  Partial thickness loss of dermis presenting as a shallow open injury with a red, pink wound bed without slough. stage 2, 1.5X.6 (Active)  09/27/21 1300  Location: Coccyx  Location Orientation: Medial  Staging: Stage 2 -  Partial thickness loss of dermis presenting as a shallow open injury with a red, pink wound bed without slough.  Wound Description (Comments): stage 2, 1.5X.6  Present on Admission: Yes     Pressure Injury Buttocks Left;Anterior;Medial Deep Tissue Pressure Injury -  Purple or maroon localized area of discolored intact skin or blood-filled blister due to damage of underlying soft tissue from pressure and/or shear. 1.5X1 inner buttock beside s (Active)     Location: Buttocks  Location Orientation: Left;Anterior;Medial   Staging: Deep Tissue Pressure Injury - Purple or maroon localized area of discolored intact skin or blood-filled blister due to damage of underlying soft tissue from pressure and/or shear.  Wound Description (Comments): 1.5X1 inner buttock beside stage 2  Present on Admission: Yes    Unresulted Labs (From admission, onward)     Start     Ordered   09/29/21 0500  CBC with Differential/Platelet  Tomorrow morning,   R       Question:  Specimen collection method  Answer:  Lab=Lab collect   09/28/21 1531              DVT prophylaxis: SCDs Start: 09/27/21 1423   Code Status: Family Communication: Wife at bedside Disposition:   Status is: Inpatient   Dispo: The patient is from: SNF              Anticipated d/c is to: To be determined              Anticipated d/c date is: CIR if approved by insurance                Consultants:  Wound care CIR Speech  Procedures:  none  Antimicrobials:    Anti-infectives (From admission, onward)    Start     Dose/Rate Route Frequency Ordered Stop   09/29/21 1200  piperacillin-tazobactam (ZOSYN) IVPB 3.375 g        3.375 g 12.5 mL/hr over 240 Minutes Intravenous Every 8 hours 09/29/21 1037     09/28/21 1200  Ampicillin-Sulbactam (UNASYN) 3 g in sodium chloride 0.9 % 100 mL IVPB  Status:  Discontinued        3 g 200 mL/hr over 30 Minutes Intravenous Every 6 hours 09/28/21 1012 09/29/21 1037   09/27/21 1700  ceFEPIme (MAXIPIME) 2 g in sodium chloride 0.9 % 100 mL IVPB  Status:  Discontinued        2 g 200 mL/hr over 30 Minutes Intravenous Every 8 hours 09/27/21 0848 09/28/21 1012   09/27/21 0815  ceFEPIme (MAXIPIME) 2 g in sodium chloride 0.9 % 100 mL IVPB        2 g 200 mL/hr over 30 Minutes Intravenous  Once 09/27/21 0800 09/27/21 0943   09/27/21 0645  cefTRIAXone (ROCEPHIN) 2 g in sodium chloride 0.9 % 100 mL IVPB  Status:  Discontinued        2 g 200 mL/hr over 30 Minutes Intravenous Every 24 hours 09/27/21 0642 09/27/21 0800    09/27/21 0645  azithromycin (ZITHROMAX) 500 mg in sodium chloride 0.9 % 250 mL IVPB  Status:  Discontinued        500 mg 250 mL/hr over 60 Minutes Intravenous Every 24 hours 09/27/21 0642 09/27/21 0801          Objective: Vitals:   10/03/21 1306 10/03/21 2125 10/04/21 0404 10/04/21 0406  BP: 121/76 129/80 133/79   Pulse: 99 94 93   Resp: (!) 23 17 18    Temp: 98.3 F (36.8 C) 98.1 F (36.7 C) 97.8 F (36.6 C)   TempSrc: Oral Oral Oral   SpO2: 94% 97% 94%   Weight:    85.6 kg  Height:        Intake/Output Summary (Last 24 hours) at  10/04/2021 1336 Last data filed at 10/04/2021 0407 Gross per 24 hour  Intake 90 ml  Output 1450 ml  Net -1360 ml   Filed Weights   10/02/21 0437 10/03/21 0421 10/04/21 0406  Weight: 84.3 kg 80.6 kg 85.6 kg    Examination:  General exam: alert, awake, communicative,calm, NAD Respiratory system: Clear to auscultation. Respiratory effort normal. Cardiovascular system:  RRR.  Gastrointestinal system: Abdomen is nondistended, soft and nontender.  Normal bowel sounds heard. Central nervous system: Alert and oriented. No focal neurological deficits. Extremities:  no edema Skin: No rashes, lesions or ulcers Psychiatry: Judgement and insight appear normal. Mood & affect appropriate.     Data Reviewed: I have personally reviewed following labs and imaging studies  CBC: Recent Labs  Lab 09/29/21 0509 09/30/21 0552 10/01/21 0608 10/02/21 0937 10/03/21 0513  WBC 6.3 6.8 7.1 9.2 9.4  NEUTROABS 3.8 3.8 3.7 5.0 4.5  HGB 10.6* 10.5* 11.7* 12.4* 12.9*  HCT 33.4* 32.6* 37.0* 38.1* 40.2  MCV 89.3 90.1 89.6 87.6 88.7  PLT 188 184 222 251 289    Basic Metabolic Panel: Recent Labs  Lab 09/29/21 0509 09/29/21 1646 09/30/21 0552 10/01/21 0608 10/02/21 0937 10/03/21 0513  NA 136  --  133* 135 134* 134*  K 3.4*  --  3.5 4.1 4.1 3.8  CL 105  --  102 101 102 102  CO2 25  --  GLUCOSE 112*  --  129* 131* 127* 131*  BUN 21  --   CREATININE 0.63  --  0.41* 0.59* 0.49* 0.61  CALCIUM 8.0*  --  7.5* 8.1* 8.3* 8.2*  MG 1.9 1.9 1.9 2.1 2.1 2.1  PHOS 3.4 3.3 2.4* 2.3* 2.9 2.9    GFR: Estimated Creatinine Clearance: 97 mL/min (by C-G formula based on SCr of 0.61 mg/dL).  Liver Function Tests: Recent Labs  Lab 09/29/21 0509 09/30/21 0552 10/01/21 0608 10/02/21 0937 10/03/21 0513  AST 37 44* 47* 54* 42*  ALT 46* 52* 62* 76* 71*  ALKPHOS 134* 133* 141* 154* 141*  BILITOT 0.7 0.4 0.4 0.3 0.5  PROT 5.6* 5.3* 5.8* 6.2* 6.2*  ALBUMIN 2.1* 1.9* 2.0* 2.2* 2.2*    CBG: Recent Labs  Lab 10/03/21 2123 10/04/21 0022 10/04/21 0402 10/04/21 0746 10/04/21 1217  GLUCAP 113* 122* 139* 109* 125*     Recent Results (from the past 240 hour(s))  Blood Culture (routine x 2)     Status: None   Collection Time: 09/27/21  6:47 AM   Specimen: BLOOD  Result Value Ref Range Status   Specimen Description   Final    BLOOD BLOOD RIGHT FOREARM Performed at [facility], [address] [place], [place] [address]    Special Requests   Final    BOTTLES DRAWN AEROBIC AND ANAEROBIC Blood Culture adequate volume Performed at [facility], [address] [place], [place] [address]    Culture   Final    NO GROWTH 5 DAYS Performed at [facility], [address], [place], [place] [address]    Report Status 10/02/2021 FINAL  Final  Resp Panel by RT-PCR (Flu A&B, Covid) Nasopharyngeal Swab     Status: None   Collection Time: 09/27/21  6:48 AM   Specimen: Nasopharyngeal Swab; Nasopharyngeal(NP) swabs in vial transport medium  Result Value Ref Range Status   SARS Coronavirus 2 by RT PCR NEGATIVE NEGATIVE Final    Comment: (NOTE)  SARS-CoV-2 target nucleic acids are NOT DETECTED.  The SARS-CoV-2 RNA is generally detectable in upper respiratory specimens during the acute phase of infection. The lowest concentration of SARS-CoV-2 viral copies this assay can detect is 138  copies/mL. A negative result does not preclude SARS-Cov-2 infection and should not be used as the sole basis for treatment or other patient management decisions. A negative result may occur with  improper specimen collection/handling, submission of specimen other than nasopharyngeal swab, presence of viral mutation(s) within the areas targeted by this assay, and inadequate number of viral copies(<138 copies/mL). A negative result must be combined with clinical observations, patient history, and epidemiological information. The expected result is Negative.  Fact Sheet for Patients:  BloggerCourse.com  Fact Sheet for Healthcare Providers:  SeriousBroker.it  This test is no t yet approved or cleared by the [place] FDA and  has been authorized for detection and/or diagnosis of SARS-CoV-2 by FDA under an Emergency Use Authorization (EUA). This EUA will remain  in effect (meaning this test can be used) for the duration of the COVID-19 declaration under Section 564(b)(1) of the Act, 21 U.S.C.section 360bbb-3(b)(1), unless the authorization is terminated  or revoked sooner.       Influenza A by PCR NEGATIVE NEGATIVE Final   Influenza B by PCR NEGATIVE NEGATIVE Final    Comment: (NOTE) The Xpert Xpress SARS-CoV-2/FLU/RSV plus assay is intended as an aid in the diagnosis of influenza from Nasopharyngeal swab specimens and should not be used as a sole basis for treatment. Nasal washings and aspirates are unacceptable for Xpert Xpress SARS-CoV-2/FLU/RSV testing.  Fact Sheet for Patients: BloggerCourse.com  Fact Sheet for Healthcare Providers: SeriousBroker.it  This test is not yet approved or cleared by the [place] FDA and has been authorized for detection and/or diagnosis of SARS-CoV-2 by FDA under an Emergency Use Authorization (EUA). This EUA will remain in effect (meaning  this test can be used) for the duration of the COVID-19 declaration under Section 564(b)(1) of the Act, 21 U.S.C. section 360bbb-3(b)(1), unless the authorization is terminated or revoked.  Performed at [facility], [address], [place], [place] [address]   Urine Culture     Status: Abnormal   Collection Time: 09/27/21  6:48 AM   Specimen: In/Out Cath Urine  Result Value Ref Range Status   Specimen Description   Final    IN/OUT CATH URINE Performed at [facility], [address], [place], [place] [address]    Special Requests   Final    NONE Performed at [facility], [address], [place], [place] [address]    Culture (A)  Final    >=100,000 COLONIES/mL KLEBSIELLA PNEUMONIAE >=100,000 COLONIES/mL PSEUDOMONAS AERUGINOSA    Report Status 09/30/2021 FINAL  Final   Organism ID, Bacteria KLEBSIELLA PNEUMONIAE (A)  Final   Organism ID, Bacteria PSEUDOMONAS AERUGINOSA (A)  Final      Susceptibility   Klebsiella pneumoniae - MIC*    AMPICILLIN >=32 RESISTANT Resistant     CEFAZOLIN <=4 SENSITIVE Sensitive     CEFEPIME <=0.12 SENSITIVE Sensitive     CEFTRIAXONE <=0.25 SENSITIVE Sensitive     CIPROFLOXACIN <=0.25 SENSITIVE Sensitive     GENTAMICIN <=1 SENSITIVE Sensitive     IMIPENEM <=0.25 SENSITIVE Sensitive     NITROFURANTOIN 64 INTERMEDIATE Intermediate     TRIMETH/SULFA <=20 SENSITIVE Sensitive     AMPICILLIN/SULBACTAM 4 SENSITIVE Sensitive     PIP/TAZO <=4 SENSITIVE Sensitive     * >=  100,000 COLONIES/mL KLEBSIELLA PNEUMONIAE   Pseudomonas aeruginosa - MIC*    CEFTAZIDIME 4 SENSITIVE Sensitive     CIPROFLOXACIN <=0.25 SENSITIVE Sensitive     GENTAMICIN 2 SENSITIVE Sensitive     IMIPENEM 2 SENSITIVE Sensitive     PIP/TAZO 8 SENSITIVE Sensitive     CEFEPIME 2 SENSITIVE Sensitive     * >=100,000 COLONIES/mL PSEUDOMONAS AERUGINOSA  Blood Culture (routine x 2)     Status: None   Collection Time: 09/27/21  6:53 AM    Specimen: BLOOD  Result Value Ref Range Status   Specimen Description   Final    BLOOD BLOOD LEFT FOREARM Performed at [facility], [address] [place], [place] [address]    Special Requests   Final    BOTTLES DRAWN AEROBIC AND ANAEROBIC Blood Culture results may not be optimal due to an inadequate volume of blood received in culture bottles Performed at [facility], [address], [place], [place] [address]    Culture   Final    NO GROWTH 5 DAYS Performed at [facility], [address], [place], [place] [address]    Report Status 10/02/2021 FINAL  Final  MRSA Next Gen by PCR, Nasal     Status: Abnormal   Collection Time: 09/27/21 12:34 PM   Specimen: Nasal Mucosa; Nasal Swab  Result Value Ref Range Status   MRSA by PCR Next Gen DETECTED (A) NOT DETECTED Final    Comment: RESULT CALLED TO, READ BACK BY AND VERIFIED WITH:  AMANDA WATSON RN 09/27/21 @ 0528 VS (NOTE) The GeneXpert MRSA Assay (FDA approved for NASAL specimens only), is one component of a comprehensive MRSA colonization surveillance program. It is not intended to diagnose MRSA infection nor to guide or monitor treatment for MRSA infections. Test performance is not FDA approved in patients less than 72 years old. Performed at [facility], [address] [place], [place] [address]          Radiology Studies: US Abdomen Limited RUQ (LIVER/GB)  Result Date: 10/02/2021 CLINICAL DATA:  Abnormal LFTs. EXAM: ULTRASOUND ABDOMEN LIMITED RIGHT UPPER QUADRANT COMPARISON:  Right upper quadrant ultrasound dated 09/17/2021. FINDINGS: Gallbladder: There is tumefactive sludge within the gallbladder. No shadowing stone. No gallbladder wall thickening or pericholecystic fluid. Negative sonographic Murphy's sign. Common bile duct: Diameter: 3 mm Liver: No focal lesion identified. Within normal limits in parenchymal echogenicity. Portal vein is patent on color  Doppler imaging with normal direction of blood flow towards the liver. Other: None. IMPRESSION: Gallbladder sludge, otherwise unremarkable right upper quadrant ultrasound. Electronically Signed   By: Elgie Collard M.D.   On: 10/02/2021 20:27        Scheduled Meds:  aspirin  81 mg Per Tube q AM   Chlorhexidine Gluconate Cloth  6 each Topical Daily   diclofenac Sodium  2 g Topical QID   feeding supplement (PROSource TF)  45 mL Per Tube BID   free water  150 mL Per Tube Q4H   Gerhardt's butt cream   Topical BID   insulin aspart  0-9 Units Subcutaneous Q4H   mouth rinse  15 mL Mouth Rinse BID   nutrition supplement (JUVEN)  1 packet Per Tube BID BM   pantoprazole sodium  40 mg Per Tube Daily   simvastatin  40 mg Per Tube QPM   Continuous Infusions:  feeding supplement (JEVITY 1.5 CAL/FIBER) 948 mL (10/04/21 1325)   piperacillin-tazobactam 3.375 g (10/04/21 1145)  LOS: 6 days   Time spent: Greater than 50% of this time was spent in counseling, explanation of diagnosis, planning of further management, and coordination of care.   Voice Recognition Reubin Milan dictation system was used to create this note, attempts have been made to correct errors. Please contact the author with questions and/or clarifications.   Albertine Grates, MD PhD FACP [facility]  Available via Epic secure chat 7am-7pm for nonurgent issues Please page for urgent issues To page the attending provider between 7A-7P or the covering provider during after hours 7P-7A, please log into the web site [email or web address] and access using universal  password for that web site. If you do not have the password, please call the hospital operator.    10/04/2021, 1:36 PM

## 2021-10-04 NOTE — Care Management Important Message (Signed)
Important Message  Patient Details IM Letter given to the Patient. Name: Raymond Stuart MRN: [id number] Date of Birth: [birth year]   Medicare Important Message Given:  Yes     Caren Macadam 10/04/2021, 10:06 AM

## 2021-10-04 NOTE — Progress Notes (Signed)
Inpatient Rehab Admissions Coordinator:   Received a denial from [facility] for CIR auth.  Pt/family, medical team, and I believe we should pursue an expedited appeal.  Will start that process today.   Estill Dooms, PT, DPT Admissions Coordinator [phone] 10/04/21  11:04 AM

## 2021-10-04 NOTE — TOC Progression Note (Signed)
Transition of Care [facility]) - Progression Note    Patient Details  Name: Raymond Stuart MRN: [id number] Date of Birth: 05-19-1951  [facility] CM/SW Contact  Berry Godsey, Meriam Sprague, RN Phone Number: 10/04/2021, 12:42 PM  Clinical Narrative:     Spoke with pt and wife at bedside for dc planning. They are very hopeful that pt can go to CIR for rehab. Per pt wife, if CIR is not an option then she would like to take him home with private caregivers. She states that he will not go back to SNF. TOC will continue to follow for results of CIR denial appeal.

## 2021-10-04 NOTE — Progress Notes (Signed)
Nutrition Follow-up  DOCUMENTATION CODES:   Severe malnutrition in context of acute illness/injury  INTERVENTION:   -Continue Jevity 1.5 @ 60 ml/hr via PEG -45 ml Prosource TF BID -150 ml free water every 4 hours (900 ml) -Regimen will provide 2240 kcal, 114 grams protein (95% protein need), and 1993 ml free water  -Juven BID via PEG, each serving provides 95kcal and 2.5g of protein (amino acids glutamine and arginine)   - will monitor for ability for diet advancement and will adjust TF accordingly dependent on PO intakes.   NUTRITION DIAGNOSIS:   Severe Malnutrition related to acute illness (fall on 9/26 with subsequent hemorrages and strokes) as evidenced by moderate fat depletion, moderate muscle depletion, severe muscle depletion, percent weight loss.  Ongoing.  GOAL:   Patient will meet greater than or equal to 90% of their needs  Meeting with TF  MONITOR:   Diet advancement, TF tolerance, Labs, Weight trends, Skin  ASSESSMENT:   70 y.o. male with medical history of GERD, HLD, osteoarthritis, and urolithiasis. He had a fall on 08/21/21 which caused subdural hematoma, traumatic intraparenchymal hemorrhage, ventricular hemorrhage in the setting of acute head injury with an occipital fracture, ischemic stroke, followed by aspiration pneumonia in the setting of stroke dysphagia, PEG placement, transaminitis, AKI, acute urinary retention with indwelling Foley catheter. He presented to the [facility] ED from [facility], where he was for <24 hours, after an episode of vomiting, fever of 101.2 F, tachypneic/dyspneic and hypoxic in the 80s.  Patient continues to receive Jevity 1.5 @ 60 ml/hr via PEG with 45 ml Prosource TF BID.  SLP last evaluated pt on 11/8 and recommended continue NPO x ice chips.  Added Juven BID via PEG given stage 2 pressure injury.   Per chart review, [name].  Admission weight: 175 lbs. Current weight: 188 lbs.  [name]: -2.5L since  admit  Medications reviewed.  Labs reviewed:  CBGs: 109-139  Diet Order:   Diet Order             Diet NPO time specified Except for: Ice Chips  Diet effective now                   EDUCATION NEEDS:   No education needs have been identified at this time  Skin:  Skin Assessment: Skin Integrity Issues: Skin Integrity Issues:: Stage II, DTI DTI: L buttocks Stage II: coccyx  Last BM:  11/8 -type 7  Height:   Ht Readings from Last 1 Encounters:  09/27/21 6' (1.829 m)    Weight:   Wt Readings from Last 1 Encounters:  10/04/21 85.6 kg    Ideal Body Weight:  80.9 kg  BMI:  Body mass index is 25.59 kg/m.  Estimated Nutritional Needs:   Kcal:  2225-2540 kcal  Protein:  120-135 grams  Fluid:  >/= 2.4 L/day  Raymond Franco, MS, RD, LDN Inpatient Clinical Dietitian Contact information available via Amion

## 2021-10-04 NOTE — Progress Notes (Signed)
Speech Language Pathology Treatment: Dysphagia  Patient Details Name: Raymond Stuart MRN: [id number] DOB: Nov 30, 1950 Today's Date: 10/04/2021 Time: 1520-1550 SLP Time Calculation (min) (ACUTE ONLY): 30 min  Assessment / Plan / Recommendation Clinical Impression  Pt today seen for further dysphagia treatment - initiating RMST and po trials of ice chips and nectar thickened liquids. Pt reports poor sleep last night - due to IV beeping. He appears with flat affect today and voice is weaker. Pt brushed his teeth with voice remaining clear!  Pt accepted intake of ice chip bolus x1 and nectar via tsp x1.  Demonstrated to pt effortful swallow by palpation of SLP's neck  - will continue to address this goal.  No indication of aspiration with po intake - nor indication of retention - however pt did not want anymore intake.  Introduced RMST - via PEP apparatus clinically set at 5cm H20 pressure - Pt performed 4 repetitions with great effort -however he admitted to some dizziness and SLP ceased trials.  Wife admits pt with dizziness today while working with OT.  Pt agreeable to continue SLP next date and try RMST tonight with his wife and to continue ICE Chip intake.  He has excellent support and is motivated - though today appears fatigued and not feeling as well as yesterday.  Encouraged pt and wife to continue po ice chips and RMT x3 repetitions tonight as long as he is not dizzy. Pt is making progress in comprehension of swallowing exercises, diagnosis and will benefit from further SLP to address dysphagia, decrease caregiver burden.      HPI HPI: Raymond Stuart is a 70 y.o. male with medical history significant for GERD hyperlipidemia, osteoarthritis, urolithiasis who was recently admitted to [facility] (9/26-29) after falling, hitting his head on concrete and sustaining a large right frontal intraparenchymal hemorrhage, large intraventricular hemorrhage, SDH and right occipital bone fx. PEG 10/27.  D/Cd home, 9/30  admitted to [facility] then D/Cd  to SNF for rehab on 11/1.  Pt was transferred from his facility after being there for less than 24 hours to [facility] after being found covered in vomit, febrile at 101.2 F,, tachypneic/dyspneic and hypoxic in the 80s.  Per Md notes, "he responded to [facility] oxygen at 10 LPM then subsequently brought to the emergency department.  At the facility, he was started getting tube feeding around 2300 last night.  He has been bedbound.  A significant portion of the history is taken from his wife, but he was able to say a few things and answer simple questions."  Swallow eval ordered.  Pt has hx of dysphagia diagnosed initially 10/4 and mech soft diet ordered, mentation waxed/waned and pt was made npo with Dobhoff.  MBS then conducted 10/19 and 11/1 - recommendation was npo x frazier water protocol.  Large barrier to adequate po intake has been his mentation.    Recs from 11/1 MBS were npo and ice chips with aggressive SLP at next venue of care.  11/3 clinical swallow evaluation recommended NPO except ice chips and repeat MBS. Repeat MBS completed 11/4 with recommendation to continue NPO except for ice/water PRN and nectar thick liquid trials with SLP only.  Pt seen today for dysphagia treatment to address pharyngeal strength/muscular contraction via effortful swallow and initiating PEP.  Pt agreeable to session.      SLP Plan         Recommendations for follow up therapy are one component of a multi-disciplinary discharge planning process, led by the attending physician.  Recommendations may be updated based on patient status, additional functional criteria and insurance authorization.    Recommendations  Diet recommendations: NPO;Other(comment) (ice chips after oral care) Medication Administration: Via alternative means Compensations: Minimize environmental distractions Postural Changes and/or Swallow Maneuvers: Seated upright 90 degrees                Oral Care Recommendations:  Oral care QID;Oral care prior to ice chip/H20;Staff/trained caregiver to provide oral care Follow Up Recommendations: Acute inpatient rehab (3hours/day) Assistance recommended at discharge: Frequent or constant Supervision/Assistance SLP Visit Diagnosis: Dysphagia, oropharyngeal phase (R13.12)       GO             Rolena Infante, MS [facility] SLP Acute Rehab Services Office [phone] Pager [phone]   Chales Abrahams  10/04/2021, 4:19 PM

## 2021-10-04 NOTE — Progress Notes (Signed)
Occupational Therapy Treatment Patient Details Name: Raymond Stuart MRN: [id number] DOB: February 12, 1951 Today's Date: 10/04/2021   History of present illness 70 y.o. male admitted for sepsis secondary likely aspiration pnuemonia with UTI. H/O TBI, intraparenchymal hemorrhage, ischemic stroke and admitted to [facility]in ~07/2021  The day after his discharge, he was admitted to [facility] with fever,with  a prolonged, 31-day hospitalization.  S/P  G-tube placed , He was ultimately discharged to skilled nursing facility 09/26/2021. On 09/27/2021 He had an episode of emesis, witnessed aspiration event.  He was sent to the ED with a new oxygen requirement and fever.   PMH significant for GERD, OA, .   OT comments  Patient reported dizziness with increased time on edge of bed. See blood pressures below. Patient was able to progress sitting balance to reduced assistance with patient able to participate in eating ice chips on edge of bed and dynamic sitting balance challenges to increase independence in ADLs. Patient's discharge plan remains appropriate at this time. OT will continue to follow acutely.    Blood pressures:  Supine 143/90 mmhg HR 96 bpm Sitting edge of bed 148/88 mmhg HR 108bpm Sitting after 2 mins 115/86 mmhg with reports of increased dizziness. Nurse made aware.   Recommendations for follow up therapy are one component of a multi-disciplinary discharge planning process, led by the attending physician.  Recommendations may be updated based on patient status, additional functional criteria and insurance authorization.    Follow Up Recommendations  Acute inpatient rehab (3hours/day)    Assistance Recommended at Discharge    Equipment Recommendations       Recommendations for Other Services      Precautions / Restrictions Precautions Precautions: Fall Precaution Comments: g-tube RLQ, catheter, orthostatic, left hip pain Restrictions Weight Bearing Restrictions: No       Mobility  Bed Mobility Overal bed mobility: Needs Assistance Bed Mobility: Rolling;Supine to Sit;Sit to Sidelying Rolling: Max assist   Supine to sit: Max assist;+2 for safety/equipment;HOB elevated   Sit to sidelying: Max assist General bed mobility comments: patient needed physical assist for moving LLE to edge of bed with increased pain. patient needed physical assist to transition trunk into midline positioning.    Transfers                         Balance Overall balance assessment: Needs assistance Sitting-balance support: No upper extremity supported;Feet unsupported Sitting balance-Leahy Scale: Fair Sitting balance - Comments: patient was able to sit on edge of bed with max A for inital sitting balance. patient needed BUE supoprt to maintain sitting balance on edge of bed with progression to min guard on edge of bed.                                   ADL either performed or assessed with clinical judgement   ADL Overall ADL's : Needs assistance/impaired Eating/Feeding: Sitting;Set up                                          Extremity/Trunk Assessment              Vision       Perception     Praxis      Cognition Arousal/Alertness: Awake/alert Behavior During Therapy: Flat affect Overall Cognitive Status:  Within Functional Limits for tasks assessed                                 General Comments: wife was here helping with encouragement          Exercises     Shoulder Instructions       General Comments      Pertinent Vitals/ Pain       Pain Assessment: Faces Faces Pain Scale: Hurts even more Pain Location: bottom, sacrum Pain Descriptors / Indicators: Burning;Grimacing Pain Intervention(s): Limited activity within patient's tolerance;Monitored during session;Repositioned  Home Living                                          Prior Functioning/Environment               Frequency  Min 2X/week        Progress Toward Goals  OT Goals(current goals can now be found in the care plan section)  Progress towards OT goals: Progressing toward goals     Plan Discharge plan remains appropriate    Co-evaluation                 AM-PAC OT "6 Clicks" Daily Activity     Outcome Measure   Help from another person eating meals?: A Little Help from another person taking care of personal grooming?: A Lot Help from another person toileting, which includes using toliet, bedpan, or urinal?: Total Help from another person bathing (including washing, rinsing, drying)?: A Lot Help from another person to put on and taking off regular upper body clothing?: A Lot Help from another person to put on and taking off regular lower body clothing?: Total 6 Click Score: 11    End of Session    OT Visit Diagnosis: Unsteadiness on feet (R26.81);Other abnormalities of gait and mobility (R26.89);Muscle weakness (generalized) (M62.81);History of falling (Z91.81);Pain;Hemiplegia and hemiparesis Hemiplegia - Right/Left: Left Hemiplegia - dominant/non-dominant: Non-Dominant Hemiplegia - caused by: Cerebral infarction Pain - Right/Left: Left Pain - part of body: Hip;Knee   Activity Tolerance Patient tolerated treatment well;Patient limited by fatigue   Patient Left in bed;with call bell/phone within reach;with family/visitor present   Nurse Communication Other (comment) (nurse was present for part of session)        Time: 2458-0998 OT Time Calculation (min): 42 min  Charges: OT General Charges $OT Visit: 1 Visit OT Treatments $Self Care/Home Management : 23-37 mins $Therapeutic Activity: 8-22 mins  Sharyn Blitz OTR/L, MS Acute Rehabilitation Department Office# [phone] Pager# [phone]   [facility]almers Guest Ian Castagna 10/04/2021, 10:34 AM

## 2021-10-05 DIAGNOSIS — A419 Sepsis, unspecified organism: Secondary | ICD-10-CM | POA: Diagnosis not present

## 2021-10-05 LAB — GLUCOSE, CAPILLARY
Glucose-Capillary: 129 mg/dL — ABNORMAL HIGH (ref 70–99)
Glucose-Capillary: 131 mg/dL — ABNORMAL HIGH (ref 70–99)
Glucose-Capillary: 139 mg/dL — ABNORMAL HIGH (ref 70–99)
Glucose-Capillary: 149 mg/dL — ABNORMAL HIGH (ref 70–99)
Glucose-Capillary: 152 mg/dL — ABNORMAL HIGH (ref 70–99)
Glucose-Capillary: 155 mg/dL — ABNORMAL HIGH (ref 70–99)
Glucose-Capillary: 99 mg/dL (ref 70–99)

## 2021-10-05 LAB — MAGNESIUM: Magnesium: 2 mg/dL (ref 1.7–2.4)

## 2021-10-05 LAB — COMPREHENSIVE METABOLIC PANEL
ALT: 54 U/L — ABNORMAL HIGH (ref 0–44)
AST: 32 U/L (ref 15–41)
Albumin: 2.5 g/dL — ABNORMAL LOW (ref 3.5–5.0)
Alkaline Phosphatase: 126 U/L (ref 38–126)
Anion gap: 8 (ref 5–15)
BUN: 23 mg/dL (ref 8–23)
CO2: 22 mmol/L (ref 22–32)
Calcium: 8.4 mg/dL — ABNORMAL LOW (ref 8.9–10.3)
Chloride: 103 mmol/L (ref 98–111)
Creatinine, Ser: 0.54 mg/dL — ABNORMAL LOW (ref 0.61–1.24)
GFR, Estimated: >60 mL/min (ref >60)
Glucose, Bld: 122 mg/dL — ABNORMAL HIGH (ref 70–99)
Potassium: 3.8 mmol/L (ref 3.5–5.1)
Sodium: 133 mmol/L — ABNORMAL LOW (ref 135–145)
Total Bilirubin: 0.6 mg/dL (ref 0.3–1.2)
Total Protein: 6.3 g/dL — ABNORMAL LOW (ref 6.5–8.1)

## 2021-10-05 NOTE — Progress Notes (Signed)
PROGRESS NOTE    Raymond Stuart  CHE:[id number] DOB: [birth year] DOA: 09/27/2021 PCP: Caffie Damme, MD    Chief Complaint  Patient presents with   Aspiration    Pt was feed last night by peg tube at 2300, found this am laying flat, vomiting, now is tachypnic and has a high heart rate    Brief Narrative:  admitted to [facility] and then [facility] after having a subdural hematoma, traumatic intraparenchymal hemorrhage, ventricular hemorrhage in the setting of acute head injury with occipital fracture, ischemic stroke followed by aspiration pneumonia in setting of stroke dysphagia status post PEG placement, transaminitis, AKI, acute urinary tension with indwelling Foley catheter was then transferred from his rehab facility yesterday morning after being found covered in vomit and febrile with 101.2 as well as being dyspneic and hypoxic in the 80s.  He responded to nonrebreather oxygen at 10L/min and then subsequently was brought to the ED.  At the facility he was started getting tube feedings around 11 PM and he has been bedbound since his stroke.   Subjective:  No acute interval changes  Alert and interactive, no oriented to the months but knows the year, knows he is in the hospital   He remains npo, He is tolerating feeding tube   Assessment & Plan:   Principal Problem:   Sepsis due to undetermined organism [facility]) Active Problems:   Lactic acidosis   GERD (gastroesophageal reflux disease)   Type 2 diabetes mellitus with hyperglycemia (HCC)   Hyperlipidemia   Transaminitis   Moderate protein malnutrition (HCC)   Pressure injury of skin   Protein-calorie malnutrition, severe   Aspiration into airway  Sepsis/acute hypoxic respiratory failure present on admission due to aspiration pneumonia/catheter associated Klebsiella and Pseudomonas UTI -Improving, on room air -Plan for total 7 days of IV Zosyn  Diarrhea reported by previous attending Thought due to from tube  feeding and IV Zosyn Monitor  Dysphagia from CVA Continue recommend n.p.o. by speech Tolerating tube feeds  Mild oral thrush on tongue Start topical nystatin  Urinary retention possible neurogenic bladder Foley exchanged on 11/2  Mild elevation of lft Hepatitis panel negative Monitor, appear slowly improving   FTT: reported improving per family, family desires CIR placement Per wife he was fully functional prior to hospitalization [place], he was only stayed at the SNF for 8hrs then developed aspiration and admitted to [facility]  Nutritional Assessment:  The patient's BMI is: Body mass index is 23.12 kg/m.Marland Kitchen  Seen by dietician.  I agree with the assessment and plan as outlined below:  Nutrition Status: Nutrition Problem: Severe Malnutrition Etiology: acute illness (fall on 9/26 with subsequent hemorrages and strokes) Signs/Symptoms: moderate fat depletion, moderate muscle depletion, severe muscle depletion, percent weight loss Percent weight loss: 15 % Interventions: Tube feeding, Prostat  .     Skin Assessment:  I have examined the patient's skin and I agree with the wound assessment as performed by the wound care RN as outlined below:  Pressure Injury 09/27/21 Coccyx Medial Stage 2 -  Partial thickness loss of dermis presenting as a shallow open injury with a red, pink wound bed without slough. stage 2, 1.5X.6 (Active)  09/27/21 1300  Location: Coccyx  Location Orientation: Medial  Staging: Stage 2 -  Partial thickness loss of dermis presenting as a shallow open injury with a red, pink wound bed without slough.  Wound Description (Comments): stage 2, 1.5X.6  Present on Admission: Yes     Pressure  Injury Buttocks Left;Anterior;Medial Deep Tissue Pressure Injury - Purple or maroon localized area of discolored intact skin or blood-filled blister due to damage of underlying soft tissue from pressure and/or shear. 1.5X1 inner buttock beside s (Active)      Location: Buttocks  Location Orientation: Left;Anterior;Medial  Staging: Deep Tissue Pressure Injury - Purple or maroon localized area of discolored intact skin or blood-filled blister due to damage of underlying soft tissue from pressure and/or shear.  Wound Description (Comments): 1.5X1 inner buttock beside stage 2  Present on Admission: Yes    Unresulted Labs (From admission, onward)     Start     Ordered   09/29/21 0500  CBC with Differential/Platelet  Tomorrow morning,   R       Question:  Specimen collection method  Answer:  Lab=Lab collect   09/28/21 1531              DVT prophylaxis: SCDs Start: 09/27/21 1423   Code Status:full Family Communication: Wife at bedside Disposition:   Status is: Inpatient   Dispo: The patient is from: SNF              Anticipated d/c is to: To be determined              Anticipated d/c date is: medically stable to discharge, awaiting for CIR if approved by insurance                Consultants:  Wound care CIR Speech  Procedures:  none  Antimicrobials:    Anti-infectives (From admission, onward)    Start     Dose/Rate Route Frequency Ordered Stop   09/29/21 1200  piperacillin-tazobactam (ZOSYN) IVPB 3.375 g        3.375 g 12.5 mL/hr over 240 Minutes Intravenous Every 8 hours 09/29/21 1037     09/28/21 1200  Ampicillin-Sulbactam (UNASYN) 3 g in sodium chloride 0.9 % 100 mL IVPB  Status:  Discontinued        3 g 200 mL/hr over 30 Minutes Intravenous Every 6 hours 09/28/21 1012 09/29/21 1037   09/27/21 1700  ceFEPIme (MAXIPIME) 2 g in sodium chloride 0.9 % 100 mL IVPB  Status:  Discontinued        2 g 200 mL/hr over 30 Minutes Intravenous Every 8 hours 09/27/21 0848 09/28/21 1012   09/27/21 0815  ceFEPIme (MAXIPIME) 2 g in sodium chloride 0.9 % 100 mL IVPB        2 g 200 mL/hr over 30 Minutes Intravenous  Once 09/27/21 0800 09/27/21 0943   09/27/21 0645  cefTRIAXone (ROCEPHIN) 2 g in sodium chloride 0.9 % 100 mL IVPB   Status:  Discontinued        2 g 200 mL/hr over 30 Minutes Intravenous Every 24 hours 09/27/21 0642 09/27/21 0800   09/27/21 0645  azithromycin (ZITHROMAX) 500 mg in sodium chloride 0.9 % 250 mL IVPB  Status:  Discontinued        500 mg 250 mL/hr over 60 Minutes Intravenous Every 24 hours 09/27/21 0642 09/27/21 0801          Objective: Vitals:   10/04/21 1418 10/04/21 2025 10/05/21 0325 10/05/21 0442  BP: 120/67 124/71 122/74   Pulse: (!) 107 (!) 103 94   Resp: 18 20    Temp: 98 F (36.7 C) 98.7 F (37.1 C) 98.3 F (36.8 C)   TempSrc: Oral Oral Oral   SpO2: 96% 93% 94%   Weight:    77.3 kg  Height:        Intake/Output Summary (Last 24 hours) at 10/05/2021 1051 Last data filed at 10/05/2021 1006 Gross per 24 hour  Intake 0 ml  Output 2450 ml  Net -2450 ml   Filed Weights   10/03/21 0421 10/04/21 0406 10/05/21 0442  Weight: 80.6 kg 85.6 kg 77.3 kg    Examination:  General exam: alert, awake, communicative,calm, NAD Respiratory system: Clear to auscultation. Respiratory effort normal. Cardiovascular system:  RRR.  Gastrointestinal system: Abdomen is nondistended, soft and nontender.  Normal bowel sounds heard. Central nervous system: Alert and oriented. No focal neurological deficits. Extremities:  no edema Skin: No rashes, lesions or ulcers Psychiatry: Judgement and insight appear normal. Mood & affect appropriate.     Data Reviewed: I have personally reviewed following labs and imaging studies  CBC: Recent Labs  Lab 09/29/21 0509 09/30/21 0552 10/01/21 0608 10/02/21 0937 10/03/21 0513  WBC 6.3 6.8 7.1 9.2 9.4  NEUTROABS 3.8 3.8 3.7 5.0 4.5  HGB 10.6* 10.5* 11.7* 12.4* 12.9*  HCT 33.4* 32.6* 37.0* 38.1* 40.2  MCV 89.3 90.1 89.6 87.6 88.7  PLT 188 184 222 251 289    Basic Metabolic Panel: Recent Labs  Lab 09/29/21 1646 09/30/21 0552 10/01/21 0608 10/02/21 0937 10/03/21 0513 10/05/21 0522  NA  --  133* 135 134* 134* 133*  K  --  3.5 4.1  4.1 3.8 3.8  CL  --  102 101 102 102 103  CO2  --  23 25 23 25 22   GLUCOSE  --  129* 131* 127* 131* 122*  BUN  --  19 17 18 20 23   CREATININE  --  0.41* 0.59* 0.49* 0.61 0.54*  CALCIUM  --  7.5* 8.1* 8.3* 8.2* 8.4*  MG 1.9 1.9 2.1 2.1 2.1 2.0  PHOS 3.3 2.4* 2.3* 2.9 2.9  --     GFR: Estimated Creatinine Clearance: 96.6 mL/min (A) (by C-G formula based on SCr of 0.54 mg/dL (L)).  Liver Function Tests: Recent Labs  Lab 09/30/21 0552 10/01/21 0608 10/02/21 0937 10/03/21 0513 10/05/21 0522  AST 44* 47* 54* 42* 32  ALT 52* 62* 76* 71* 54*  ALKPHOS 133* 141* 154* 141* 126  BILITOT 0.4 0.4 0.3 0.5 0.6  PROT 5.3* 5.8* 6.2* 6.2* 6.3*  ALBUMIN 1.9* 2.0* 2.2* 2.2* 2.5*    CBG: Recent Labs  Lab 10/04/21 1608 10/04/21 2028 10/05/21 0020 10/05/21 0326 10/05/21 0808  GLUCAP 131* 154* 149* 129* 155*     Recent Results (from the past 240 hour(s))  Blood Culture (routine x 2)     Status: None   Collection Time: 09/27/21  6:47 AM   Specimen: BLOOD  Result Value Ref Range Status   Specimen Description   Final    BLOOD BLOOD RIGHT FOREARM Performed at [facility], [address], [place], [place]    Special Requests   Final    BOTTLES DRAWN AEROBIC AND ANAEROBIC Blood Culture adequate volume Performed at [facility], [address], [place], [place]    Culture   Final    NO GROWTH 5 DAYS Performed at [facility], [address], [place], [address] [place]    Report Status 10/02/2021 FINAL  Final  Resp Panel by RT-PCR (Flu A&B, Covid) Nasopharyngeal Swab     Status: None   Collection Time: 09/27/21  6:48 AM   Specimen: Nasopharyngeal Swab; Nasopharyngeal(NP) swabs in vial transport medium  Result Value Ref  Range Status   SARS Coronavirus 2 by RT PCR NEGATIVE NEGATIVE Final    Comment: (NOTE) SARS-CoV-2 target nucleic acids are NOT DETECTED.  The SARS-CoV-2 RNA is generally detectable in upper  respiratory specimens during the acute phase of infection. The lowest concentration of SARS-CoV-2 viral copies this assay can detect is 138 copies/mL. A negative result does not preclude SARS-Cov-2 infection and should not be used as the sole basis for treatment or other patient management decisions. A negative result may occur with  improper specimen collection/handling, submission of specimen other than nasopharyngeal swab, presence of viral mutation(s) within the areas targeted by this assay, and inadequate number of viral copies(<138 copies/mL). A negative result must be combined with clinical observations, patient history, and epidemiological information. The expected result is Negative.  Fact Sheet for Patients:  BloggerCourse.com  Fact Sheet for Healthcare Providers:  SeriousBroker.it  This test is no t yet approved or cleared by the [place] FDA and  has been authorized for detection and/or diagnosis of SARS-CoV-2 by FDA under an Emergency Use Authorization (EUA). This EUA will remain  in effect (meaning this test can be used) for the duration of the COVID-19 declaration under Section 564(b)(1) of the Act, 21 U.S.C.section 360bbb-3(b)(1), unless the authorization is terminated  or revoked sooner.       Influenza A by PCR NEGATIVE NEGATIVE Final   Influenza B by PCR NEGATIVE NEGATIVE Final    Comment: (NOTE) The Xpert Xpress SARS-CoV-2/FLU/RSV plus assay is intended as an aid in the diagnosis of influenza from Nasopharyngeal swab specimens and should not be used as a sole basis for treatment. Nasal washings and aspirates are unacceptable for Xpert Xpress SARS-CoV-2/FLU/RSV testing.  Fact Sheet for Patients: BloggerCourse.com  Fact Sheet for Healthcare Providers: SeriousBroker.it  This test is not yet approved or cleared by the [place] FDA and has been  authorized for detection and/or diagnosis of SARS-CoV-2 by FDA under an Emergency Use Authorization (EUA). This EUA will remain in effect (meaning this test can be used) for the duration of the COVID-19 declaration under Section 564(b)(1) of the Act, 21 U.S.C. section 360bbb-3(b)(1), unless the authorization is terminated or revoked.  Performed at [facility], [address], [place], [place] [address]   Urine Culture     Status: Abnormal   Collection Time: 09/27/21  6:48 AM   Specimen: In/Out Cath Urine  Result Value Ref Range Status   Specimen Description   Final    IN/OUT CATH URINE Performed at [facility], [address], [place], [place] [address]    Special Requests   Final    NONE Performed at [facility], [address] [place], [place] [address]    Culture (A)  Final    >=100,000 COLONIES/mL KLEBSIELLA PNEUMONIAE >=100,000 COLONIES/mL PSEUDOMONAS AERUGINOSA    Report Status 09/30/2021 FINAL  Final   Organism ID, Bacteria KLEBSIELLA PNEUMONIAE (A)  Final   Organism ID, Bacteria PSEUDOMONAS AERUGINOSA (A)  Final      Susceptibility   Klebsiella pneumoniae - MIC*    AMPICILLIN >=32 RESISTANT Resistant     CEFAZOLIN <=4 SENSITIVE Sensitive     CEFEPIME <=0.12 SENSITIVE Sensitive     CEFTRIAXONE <=0.25 SENSITIVE Sensitive     CIPROFLOXACIN <=0.25 SENSITIVE Sensitive     GENTAMICIN <=1 SENSITIVE Sensitive     IMIPENEM <=0.25 SENSITIVE Sensitive     NITROFURANTOIN 64 INTERMEDIATE Intermediate     TRIMETH/SULFA <=20 SENSITIVE Sensitive  AMPICILLIN/SULBACTAM 4 SENSITIVE Sensitive     PIP/TAZO <=4 SENSITIVE Sensitive     * >=100,000 COLONIES/mL KLEBSIELLA PNEUMONIAE   Pseudomonas aeruginosa - MIC*    CEFTAZIDIME 4 SENSITIVE Sensitive     CIPROFLOXACIN <=0.25 SENSITIVE Sensitive     GENTAMICIN 2 SENSITIVE Sensitive     IMIPENEM 2 SENSITIVE Sensitive     PIP/TAZO 8 SENSITIVE Sensitive     CEFEPIME 2  SENSITIVE Sensitive     * >=100,000 COLONIES/mL PSEUDOMONAS AERUGINOSA  Blood Culture (routine x 2)     Status: None   Collection Time: 09/27/21  6:53 AM   Specimen: BLOOD  Result Value Ref Range Status   Specimen Description   Final    BLOOD BLOOD LEFT FOREARM Performed at [facility], [address], [place], [place] [address]    Special Requests   Final    BOTTLES DRAWN AEROBIC AND ANAEROBIC Blood Culture results may not be optimal due to an inadequate volume of blood received in culture bottles Performed at [facility], [address] [place], [place] [address]    Culture   Final    NO GROWTH 5 DAYS Performed at [facility], [address], [place], [place] [address]    Report Status 10/02/2021 FINAL  Final  MRSA Next Gen by PCR, Nasal     Status: Abnormal   Collection Time: 09/27/21 12:34 PM   Specimen: Nasal Mucosa; Nasal Swab  Result Value Ref Range Status   MRSA by PCR Next Gen DETECTED (A) NOT DETECTED Final    Comment: RESULT CALLED TO, READ BACK BY AND VERIFIED WITH:  AMANDA WATSON RN 09/27/21 @ 0528 VS (NOTE) The GeneXpert MRSA Assay (FDA approved for NASAL specimens only), is one component of a comprehensive MRSA colonization surveillance program. It is not intended to diagnose MRSA infection nor to guide or monitor treatment for MRSA infections. Test performance is not FDA approved in patients less than 83 years old. Performed at [facility], [address] [place], [place] [address]          Radiology Studies: No results found.      Scheduled Meds:  aspirin  81 mg Per Tube q AM   Chlorhexidine Gluconate Cloth  6 each Topical Daily   diclofenac Sodium  2 g Topical QID   feeding supplement (PROSource TF)  45 mL Per Tube BID   free water  150 mL Per Tube Q4H   Gerhardt's butt cream   Topical BID   insulin aspart  0-9 Units Subcutaneous Q4H   mouth rinse  15 mL Mouth Rinse BID    nutrition supplement (JUVEN)  1 packet Per Tube BID BM   nystatin  5 mL Oral QID   pantoprazole sodium  40 mg Per Tube Daily   simvastatin  40 mg Per Tube QPM   Continuous Infusions:  feeding supplement (JEVITY 1.5 CAL/FIBER) 948 mL (10/04/21 1325)   piperacillin-tazobactam 3.375 g (10/05/21 0406)     LOS: 7 days   Time spent: Greater than 50% of this time was spent in counseling, explanation of diagnosis, planning of further management, and coordination of care.   Voice Recognition Reubin Milan dictation system was used to create this note, attempts have been made to correct errors. Please contact the author with questions and/or clarifications.   Albertine Grates, MD PhD FACP [facility]  Available via Epic secure chat 7am-7pm for nonurgent issues Please page for urgent issues To  page the attending provider between 7A-7P or the covering provider during after hours 7P-7A, please log into the web site [email or web address] and access using universal [place] password for that web site. If you do not have the password, please call the hospital operator.    10/05/2021, 10:51 AM

## 2021-10-05 NOTE — Progress Notes (Signed)
SLP Cancellation Note  Patient Details Name: Raymond Stuart MRN: [id number] DOB: 08-24-1951   Cancelled treatment:       Reason Eval/Treat Not Completed: Other (comment) (pt getting transferred to bed from the chair, wife reports he is exhausted and has been sitting upright for 2 1/2 hours, will continue efforts) Rolena Infante, MS [facility] SLP Acute Rehab Services Office [phone] Pager [phone]   Chales Abrahams 10/05/2021, 2:30 PM

## 2021-10-05 NOTE — Progress Notes (Signed)
Physical Therapy Treatment Patient Details Name: Raymond Stuart MRN: [id number] DOB: May 07, 1951 Today's Date: 10/05/2021   History of Present Illness 70 y.o. male admitted for sepsis secondary likely aspiration pnuemonia with UTI. H/O TBI, intraparenchymal hemorrhage, ischemic stroke and admitted to [facility].in ~07/2021  The day after his discharge, he was admitted to [facility] with fever,with  a prolonged, 31-day hospitalization.  S/P  G-tube placed , He was ultimately discharged to skilled nursing facility 09/26/2021. On 09/27/2021 He had an episode of emesis, witnessed aspiration event.  He was sent to the ED with a new oxygen requirement and fever.   PMH significant for GERD, OA, .    PT Comments    Pt sat at edge of bed for 17 minutes with report of mild dizziness. BP supine 135/81, sitting 127/83, then after 10 minutes sitting 133/90. Overall improved tolerance of sitting and improved sitting balance. Sit to stand with +2 mod assist using Stedy, then pivoted to recliner with Stedy. Pt performed BUE/LE strengthening exercises with assistance. He reports L knee pain since he fell from a truck in September. He is an excellent CIR candidate.     Recommendations for follow up therapy are one component of a multi-disciplinary discharge planning process, led by the attending physician.  Recommendations may be updated based on patient status, additional functional criteria and insurance authorization.  Follow Up Recommendations  Acute inpatient rehab (3hours/day)     Assistance Recommended at Discharge Frequent or constant Supervision/Assistance  Equipment Recommendations  Other (comment) (TBD at next venue)    Recommendations for Other Services Rehab consult     Precautions / Restrictions Precautions Precautions: Fall Precaution Comments: g-tube RLQ, catheter, orthostatic, left hip pain Restrictions Weight Bearing Restrictions: No     Mobility  Bed Mobility Overal bed mobility:  Needs Assistance Bed Mobility: Rolling;Sidelying to Sit Rolling: Mod assist Sidelying to sit: Mod assist;+2 for physical assistance       General bed mobility comments: patient needed physical assist for moving LLE to edge of bed with increased pain. patient needed physical assist to transition trunk into midline positioning. Sat EOB x 17 minutes with mild dizziness. Initial BP sitting was 127/83, then after 10 minutes 133/90.    Transfers Overall transfer level: Needs assistance   Transfers: Sit to/from Stand;Bed to chair/wheelchair/BSC Sit to Stand: +2 physical assistance;+2 safety/equipment;From elevated surface;Mod assist Stand pivot transfers: Total assist;+2 physical assistance         General transfer comment: +2 mod A to rise from elevated bed, pulling up on bar on Stedy; pt stood briefly (~10 seconds) then fatigued and sat on flaps of Stedy. Transfer via Lift Equipment: Stedy  Ambulation/Gait               General Gait Details: unable   Social research officer, government Rankin (Stroke Patients Only)       Balance Overall balance assessment: Needs assistance Sitting-balance support: No upper extremity supported;Feet supported;Single extremity supported Sitting balance-Leahy Scale: Fair Sitting balance - Comments: patient was able to sit on edge of bed with min A for inital sitting balance. patient needed BUE supoprt to maintain sitting balance on edge of bed with progression to min guard on edge of bed. He sat EOB for 17 minutes total, reported mild dizziness, not orthostatic. Mild R lateral lean which he was able to self correct with verbal cues. Postural control: Right lateral lean  Cognition Arousal/Alertness: Awake/alert Behavior During Therapy: Flat affect Overall Cognitive Status: Within Functional Limits for tasks assessed                                 General  Comments: wife was here helping with encouragement        Exercises General Exercises - Lower Extremity Ankle Circles/Pumps: AROM;Both;10 reps Long Arc Quad: AROM;AAROM;Both;10 reps;Seated;Other (comment) (AROM R, AAROM L) Heel Slides: AAROM;Left;10 reps;Supine Hip ABduction/ADduction: AAROM;Left;5 reps;Supine Shoulder Exercises Shoulder Flexion: AAROM;Both;10 reps;Seated    General Comments        Pertinent Vitals/Pain Pain Score: 2  Pain Location: L knee Pain Descriptors / Indicators: Grimacing Pain Intervention(s): Limited activity within patient's tolerance;Monitored during session;Premedicated before session;Repositioned    Home Living                          Prior Function            PT Goals (current goals can now be found in the care plan section) Acute Rehab PT Goals Patient Stated Goal: to walk, return to doing yardwork, playing with 6 y.o. granddaughter PT Goal Formulation: With patient/family Time For Goal Achievement: 10/13/21 Potential to Achieve Goals: Good Progress towards PT goals: Progressing toward goals    Frequency    Min 3X/week      PT Plan Current plan remains appropriate    Co-evaluation              AM-PAC PT "6 Clicks" Mobility   Outcome Measure  Help needed turning from your back to your side while in a flat bed without using bedrails?: A Lot Help needed moving from lying on your back to sitting on the side of a flat bed without using bedrails?: Total Help needed moving to and from a bed to a chair (including a wheelchair)?: Total Help needed standing up from a chair using your arms (e.g., wheelchair or bedside chair)?: Total Help needed to walk in hospital room?: Total Help needed climbing 3-5 steps with a railing? : Total 6 Click Score: 7    End of Session Equipment Utilized During Treatment: Gait belt Activity Tolerance: Patient tolerated treatment well Patient left: with call bell/phone within reach;with  family/visitor present;in chair;with chair alarm set Nurse Communication: Mobility status;Need for lift equipment PT Visit Diagnosis: Difficulty in walking, not elsewhere classified (R26.2);Muscle weakness (generalized) (M62.81);Pain;Unsteadiness on feet (R26.81) Pain - Right/Left: Left Pain - part of body: Hip     Time: 1761-6073 PT Time Calculation (min) (ACUTE ONLY): 44 min  Charges:  $Therapeutic Exercise: 8-22 mins $Therapeutic Activity: 23-37 mins                    Ralene Bathe Kistler PT 10/05/2021  Acute Rehabilitation Services Pager [phone] Office [phone]

## 2021-10-06 LAB — COMPREHENSIVE METABOLIC PANEL
ALT: 46 U/L — ABNORMAL HIGH (ref 0–44)
AST: 29 U/L (ref 15–41)
Albumin: 2.5 g/dL — ABNORMAL LOW (ref 3.5–5.0)
Alkaline Phosphatase: 112 U/L (ref 38–126)
Anion gap: 7 (ref 5–15)
BUN: 27 mg/dL — ABNORMAL HIGH (ref 8–23)
CO2: 25 mmol/L (ref 22–32)
Calcium: 8.4 mg/dL — ABNORMAL LOW (ref 8.9–10.3)
Chloride: 103 mmol/L (ref 98–111)
Creatinine, Ser: 0.61 mg/dL (ref 0.61–1.24)
GFR, Estimated: >60 mL/min (ref >60)
Glucose, Bld: 113 mg/dL — ABNORMAL HIGH (ref 70–99)
Potassium: 3.8 mmol/L (ref 3.5–5.1)
Sodium: 135 mmol/L (ref 135–145)
Total Bilirubin: 0.4 mg/dL (ref 0.3–1.2)
Total Protein: 6.2 g/dL — ABNORMAL LOW (ref 6.5–8.1)

## 2021-10-06 LAB — CBC
HCT: 37.4 % — ABNORMAL LOW (ref 39.0–52.0)
Hemoglobin: 12.2 g/dL — ABNORMAL LOW (ref 13.0–17.0)
MCH: 28.8 pg (ref 26.0–34.0)
MCHC: 32.6 g/dL (ref 30.0–36.0)
MCV: 88.4 fL (ref 80.0–100.0)
Platelets: 322 10*3/uL (ref 150–400)
RBC: 4.23 MIL/uL (ref 4.22–5.81)
RDW: 14.8 % (ref 11.5–15.5)
WBC: 11.6 10*3/uL — ABNORMAL HIGH (ref 4.0–10.5)
nRBC: 0 % (ref 0.0–0.2)

## 2021-10-06 LAB — GLUCOSE, CAPILLARY
Glucose-Capillary: 160 mg/dL — ABNORMAL HIGH (ref 70–99)
Glucose-Capillary: 123 mg/dL — ABNORMAL HIGH (ref 70–99)
Glucose-Capillary: 123 mg/dL — ABNORMAL HIGH (ref 70–99)
Glucose-Capillary: 140 mg/dL — ABNORMAL HIGH (ref 70–99)
Glucose-Capillary: 123 mg/dL — ABNORMAL HIGH (ref 70–99)

## 2021-10-06 MED ORDER — JEVITY 1.5 CAL/FIBER PO LIQD
1000.0000 mL | ORAL | Status: DC
  Administered 2021-10-06 (×2): 1000 mL
  Filled 2021-10-06 (×4): qty 1000

## 2021-10-06 NOTE — Progress Notes (Signed)
Physical Therapy Treatment Patient Details Name: Raymond Stuart MRN: [id number] DOB: 12-25-1950 Today's Date: 10/06/2021   History of Present Illness 70 y.o. male admitted for sepsis secondary likely aspiration pnuemonia with UTI. H/O TBI, intraparenchymal hemorrhage, ischemic stroke and admitted to [facility]in ~07/2021  The day after his discharge, he was admitted to [facility] with fever,with  a prolonged, 31-day hospitalization.  S/P  G-tube placed , He was ultimately discharged to skilled nursing facility 09/26/2021. On 09/27/2021 He had an episode of emesis, witnessed aspiration event.  He was sent to the ED with a new oxygen requirement and fever.   PMH significant for GERD, OA, .    PT Comments    Pt is requiring less assistance for mobility. He is now able to maintain static sitting at edge of bed without external support, he was not orthostatic today, he is able to actively assist with sit to stand. Use of stedy for safety as pt needed to sit after bouts of standing. Increased standing tolerance today as well. Pt also required less assistance with LE exercises. He is an excellent CIR candidate.    Recommendations for follow up therapy are one component of a multi-disciplinary discharge planning process, led by the attending physician.  Recommendations may be updated based on patient status, additional functional criteria and insurance authorization.  Follow Up Recommendations  Acute inpatient rehab (3hours/day)     Assistance Recommended at Discharge Frequent or constant Supervision/Assistance  Equipment Recommendations  Other (comment) (TBD at next venue)    Recommendations for Other Services Rehab consult     Precautions / Restrictions Precautions Precautions: Fall Precaution Comments: g-tube, catheter, orthostatic, left hip pain Restrictions Weight Bearing Restrictions: No     Mobility  Bed Mobility Overal bed mobility: Needs Assistance Bed Mobility: Rolling;Sidelying  to Sit Rolling: Mod assist Sidelying to sit: Mod assist;+2 for physical assistance       General bed mobility comments: patient needed physical assist for moving LLE to edge of bed with increased pain. patient needed physical assist to transition trunk into midline positioning.    Transfers Overall transfer level: Needs assistance   Transfers: Sit to/from Stand;Bed to chair/wheelchair/BSC Sit to Stand: +2 physical assistance;+2 safety/equipment;From elevated surface;Mod assist Stand pivot transfers: Total assist;+2 physical assistance         General transfer comment: +2 mod A to rise from elevated bed, pt actively assisted with  pulling up on bar on Stedy; pt stood with active weight bearing through BLEs (~15 seconds x 2) then fatigued and sat on flaps of Stedy. Transfer via Lift Equipment: Stedy  Ambulation/Gait               General Gait Details: unable   Social research officer, government Rankin (Stroke Patients Only)       Balance Overall balance assessment: Needs assistance Sitting-balance support: No upper extremity supported;Feet supported;Single extremity supported Sitting balance-Leahy Scale: Fair Sitting balance - Comments: patient was able to sit on edge of bed with min A for inital sitting balance. patient needed BUE supoprt to maintain sitting balance on edge of bed with progression to min guard on edge of bed. He sat EOB for ~10 minutes total                                    Cognition Arousal/Alertness: Awake/alert  Behavior During Therapy: Flat affect Overall Cognitive Status: Within Functional Limits for tasks assessed                                 General Comments: wife was here helping with encouragement        Exercises General Exercises - Lower Extremity Ankle Circles/Pumps: AROM;Both;10 reps Long Arc Quad: AROM;Both;10 reps;Seated Heel Slides: AAROM;Left;10 reps;Supine Hip  ABduction/ADduction: AAROM;Left;Supine;10 reps    General Comments        Pertinent Vitals/Pain Pain Assessment: No/denies pain Pain Score: 4  Pain Location: L knee Pain Descriptors / Indicators: Grimacing Pain Intervention(s): Limited activity within patient's tolerance;Monitored during session;Premedicated before session;Repositioned    Home Living                          Prior Function            PT Goals (current goals can now be found in the care plan section) Acute Rehab PT Goals Patient Stated Goal: to walk, return to doing yardwork, playing with 6 y.o. granddaughter PT Goal Formulation: With patient/family Time For Goal Achievement: 10/13/21 Potential to Achieve Goals: Good    Frequency    Min 3X/week      PT Plan Current plan remains appropriate    Co-evaluation PT/OT/SLP Co-Evaluation/Treatment: Yes Reason for Co-Treatment: To address functional/ADL transfers;For patient/therapist safety PT goals addressed during session: Mobility/safety with mobility;Balance;Proper use of DME;Strengthening/ROM        AM-PAC PT "6 Clicks" Mobility   Outcome Measure  Help needed turning from your back to your side while in a flat bed without using bedrails?: A Lot Help needed moving from lying on your back to sitting on the side of a flat bed without using bedrails?: Total Help needed moving to and from a bed to a chair (including a wheelchair)?: Total Help needed standing up from a chair using your arms (e.g., wheelchair or bedside chair)?: Total Help needed to walk in hospital room?: Total Help needed climbing 3-5 steps with a railing? : Total 6 Click Score: 7    End of Session Equipment Utilized During Treatment: Gait belt Activity Tolerance: Patient tolerated treatment well Patient left: with call bell/phone within reach;with family/visitor present;in chair;with chair alarm set Nurse Communication: Mobility status;Need for lift equipment PT Visit  Diagnosis: Difficulty in walking, not elsewhere classified (R26.2);Muscle weakness (generalized) (M62.81);Pain;Unsteadiness on feet (R26.81) Pain - Right/Left: Left Pain - part of body: Hip     Time: 1206-1239 PT Time Calculation (min) (ACUTE ONLY): 33 min  Charges:  $Therapeutic Activity: 8-22 mins                     Ralene Bathe Kistler PT 10/06/2021  Acute Rehabilitation Services Pager [phone] Office [phone]

## 2021-10-06 NOTE — Progress Notes (Addendum)
Speech Language Pathology Treatment: Dysphagia  Patient Details Name: Raymond Stuart MRN: [id number] DOB: 06-17-1951 Today's Date: 10/06/2021 Time: 0815-0905 SLP Time Calculation (min) (ACUTE ONLY): 50 min  Assessment / Plan / Recommendation Clinical Impression  SLP session addressing dysphagia goals.  Pt was awake - and willing to work with this SLP.   RN assisted to slide pt up in bed - for optimal positioning. Pt brushed his teeth, used oral suctioning for expectoration.  Conducted RMST with Respironics device set at level 5 cm H20 - pt conducted 7 trials with overall work level being level 7 and min cues for effort.  SLP had pt's wife thicken applejuice to nectar for him -after which he consumed 4 ounces via tsp and cup bolus. Pt swallowing 12 boluses -first bolus was followed by subtle delayed cough. Cued cough and expectorate effective to clear secretions at beginning of session and on 2 other occasions during session.  Cup boluses of nectar thick consumed with 3 sequential swallows x4 with remainder via tsp.  Pt benefits from verbal cues to double swallow *with delay up to 10 seconds.  SLP showed pt his swallow test (recorded loops on wife's cell phone) to help him comprehend his dysphagia as his wife reports pt indicated he did not understand why he was doing SLP swallowing tasks.  Pt reported understanding after demonstration of normal MBS and his study.  He will continue to benefit from SLP treatment.  Pt is making great progress and his wife is very supportive and encourages him to do his therapy.  It is advised that pt go to CIR for aggressive dysphagia tx given his progress, motivation and family support.  Anticipate pt will make adequate gains in swallow function to be allowed po diet with aggressive treatment.      HPI HPI: Raymond Stuart is a 70 y.o. male with medical history significant for GERD hyperlipidemia, osteoarthritis, urolithiasis who was recently admitted to [facility] (9/26-29) after  falling, hitting his head on concrete and sustaining a large right frontal intraparenchymal hemorrhage, large intraventricular hemorrhage, SDH and right occipital bone fx. PEG 10/27.  D/Cd home, 9/30 admitted to [facility] then D/Cd  to SNF for rehab on 11/1.  Pt was transferred from his facility after being there for less than 24 hours to [facility] after being found covered in vomit, febrile at 101.2 F,, tachypneic/dyspneic and hypoxic in the 80s.  Per Md notes, "he responded to [facility] oxygen at 10 LPM then subsequently brought to the emergency department.  At the facility, he was started getting tube feeding around 2300 last night.  He has been bedbound.  A significant portion of the history is taken from his wife, but he was able to say a few things and answer simple questions."  Swallow eval ordered.  Pt has hx of dysphagia diagnosed initially 10/4 and mech soft diet ordered, mentation waxed/waned and pt was made npo with Dobhoff.  MBS then conducted 10/19 and 11/1 - recommendation was npo x frazier water protocol.  Large barrier to adequate po intake has been his mentation.    Recs from 11/1 MBS were npo and ice chips with aggressive SLP at next venue of care.  11/3 clinical swallow evaluation recommended NPO except ice chips and repeat MBS. Repeat MBS completed 11/4 with recommendation to continue NPO except for ice/water PRN and nectar thick liquid trials with SLP only.  Pt seen today for dysphagia treatment to address pharyngeal strength/muscular contraction via effortful swallow and initiating PEP.  Pt  agreeable to session.      SLP Plan  Continue with current plan of care      Recommendations for follow up therapy are one component of a multi-disciplinary discharge planning process, led by the attending physician.  Recommendations may be updated based on patient status, additional functional criteria and insurance authorization.    Recommendations  Diet recommendations: NPO (ice chips) Medication  Administration: Via alternative means Supervision: Patient able to self feed Compensations: Minimize environmental distractions;Multiple dry swallows after each bite/sip Postural Changes and/or Swallow Maneuvers: Seated upright 90 degrees                Oral Care Recommendations: Oral care QID;Oral care prior to ice chip/H20;Staff/trained caregiver to provide oral care Follow Up Recommendations: Acute inpatient rehab (3hours/day) Assistance recommended at discharge: Frequent or constant Supervision/Assistance SLP Visit Diagnosis: Dysphagia, oropharyngeal phase (R13.12) Plan: Continue with current plan of care       GO              Rolena Infante, MS [facility] SLP Acute Rehab Services Office [phone] Pager [phone]   Chales Abrahams  10/06/2021, 10:32 AM

## 2021-10-06 NOTE — Progress Notes (Signed)
Occupational Therapy Treatment Patient Details Name: Raymond Stuart MRN: [id number] DOB: 1951/11/02 Today's Date: 10/06/2021   History of present illness 70 y.o. male admitted for sepsis secondary likely aspiration pnuemonia with UTI. H/O TBI, intraparenchymal hemorrhage, ischemic stroke and admitted to [facility].in ~07/2021  The day after his discharge, he was admitted to [facility] with fever,with  a prolonged, 31-day hospitalization.  S/P  G-tube placed , He was ultimately discharged to skilled nursing facility 09/26/2021. On 09/27/2021 He had an episode of emesis, witnessed aspiration event.  He was sent to the ED with a new oxygen requirement and fever.   PMH significant for GERD, OA, .   OT comments  Pt with progress towards goals this session. Pt performed bed mobility with Mod A +2. Pt then performed sit to stand with Mod A +2 with stedy equipment used for pt safety, to avoid knee buckling, and allowed pt to participate in transfer using grab bar to assist to stand. Pt stood x2 for 15 seconds each time. Pt also performed LE and UE exercises in seated position (see below). Despite continued pain in R hip and LLE, pt motivated to participate in therapy sessions and is progressing towards therapy goals. Continuing to recommend Acute inpatient rehab.   Recommendations for follow up therapy are one component of a multi-disciplinary discharge planning process, led by the attending physician.  Recommendations may be updated based on patient status, additional functional criteria and insurance authorization.    Follow Up Recommendations  Acute inpatient rehab (3hours/day)    Assistance Recommended at Discharge    Equipment Recommendations       Recommendations for Other Services      Precautions / Restrictions Precautions Precautions: Fall Precaution Comments: g-tube, catheter, orthostatic, left hip pain Restrictions Weight Bearing Restrictions: No       Mobility Bed Mobility Overal  bed mobility: Needs Assistance Bed Mobility: Rolling;Sidelying to Sit Rolling: Mod assist Sidelying to sit: Mod assist;+2 for physical assistance       General bed mobility comments: required physical assist to bring LEs off side of bed and power up into sitting.    Transfers Overall transfer level: Needs assistance   Transfers: Sit to/from Stand;Bed to chair/wheelchair/BSC Sit to Stand: +2 physical assistance;+2 safety/equipment;From elevated surface;Mod assist Stand pivot transfers: Total assist;+2 physical assistance         General transfer comment: +2 mod A to rise from elevated bed, pt actively assisted with  pulling up on bar on Stedy; pt stood with active weight bearing through BLEs (~15 seconds x 2) then fatigued and sat on flaps of Stedy. Transfer via Lift Equipment: Stedy   Balance Overall balance assessment: Needs assistance Sitting-balance support: No upper extremity supported;Feet supported;Single extremity supported Sitting balance-Leahy Scale: Fair Sitting balance - Comments: BUE support to maintain sitting balance. Sat EOB for 10 minutes. Postural control: Right lateral lean                                 ADL either performed or assessed with clinical judgement   ADL Overall ADL's : Needs assistance/impaired                                     Functional mobility during ADLs: +2 for safety/equipment;+2 for physical assistance;Mod assistance      Extremity/Trunk Assessment  Vision       Perception     Praxis      Cognition Arousal/Alertness: Awake/alert Behavior During Therapy: Flat affect Overall Cognitive Status: Within Functional Limits for tasks assessed Area of Impairment: Orientation                 Orientation Level: Person;Place;Time;Situation Current Attention Level: Sustained   Following Commands: Follows one step commands with increased time;Follows multi-step commands  inconsistently Safety/Judgement: Decreased awareness of safety Awareness: Emergent Problem Solving: Slow processing;Requires verbal cues General Comments: wife was present, pt continues with flat affect and slightly anxious about OOB mobility, pt reassured.          Exercises  Shoulder Exercises Shoulder Flexion: AAROM;Both;10 reps;Seated Other Exercises Other Exercises: Pt and caregiver educated on folding wash cloth 5-10x making sure to focus on quality of folding with edges to increase UE coordination.   Shoulder Instructions       General Comments      Pertinent Vitals/ Pain       Pain Assessment: Faces Pain Score: 4  Faces Pain Scale: Hurts little more Pain Location: L knee, R hip Pain Descriptors / Indicators: Grimacing Pain Intervention(s): Monitored during session;Repositioned;Premedicated before session  Home Living                                          Prior Functioning/Environment              Frequency  Min 2X/week        Progress Toward Goals  OT Goals(current goals can now be found in the care plan section)  Progress towards OT goals: Progressing toward goals  Acute Rehab OT Goals Patient Stated Goal: to use BSC OT Goal Formulation: With patient Time For Goal Achievement: 10/13/21 Potential to Achieve Goals: Good ADL Goals Pt Will Perform Grooming: sitting;with set-up Pt Will Perform Upper Body Bathing: with set-up;sitting Pt Will Perform Lower Body Bathing: with mod assist;sit to/from stand Pt Will Transfer to Toilet: stand pivot transfer;bedside commode;with mod assist Pt Will Perform Toileting - Clothing Manipulation and hygiene: with mod assist;sit to/from stand  Plan Discharge plan remains appropriate    Co-evaluation    PT/OT/SLP Co-Evaluation/Treatment: Yes Reason for Co-Treatment: To address functional/ADL transfers;For patient/therapist safety PT goals addressed during session: Mobility/safety with  mobility;Strengthening/ROM;Proper use of DME        AM-PAC OT "6 Clicks" Daily Activity     Outcome Measure   Help from another person eating meals?: A Little Help from another person taking care of personal grooming?: A Lot Help from another person toileting, which includes using toliet, bedpan, or urinal?: Total Help from another person bathing (including washing, rinsing, drying)?: A Lot Help from another person to put on and taking off regular upper body clothing?: A Lot Help from another person to put on and taking off regular lower body clothing?: Total 6 Click Score: 11    End of Session    OT Visit Diagnosis: Unsteadiness on feet (R26.81);Other abnormalities of gait and mobility (R26.89);Muscle weakness (generalized) (M62.81);History of falling (Z91.81);Pain;Hemiplegia and hemiparesis Hemiplegia - Right/Left: Left Hemiplegia - dominant/non-dominant: Non-Dominant Hemiplegia - caused by: Cerebral infarction   Activity Tolerance     Patient Left     Nurse Communication          Time: 7353-2992 OT Time Calculation (min): 37 min  Charges: OT General Charges $OT  Visit: 1 Visit OT Treatments $Therapeutic Exercise: 8-22 mins  Prudence Davidson, OTS Acute Rehab Office: [phone]   Varetta Chavers 10/06/2021, 2:00 PM
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
## 2021-10-06 NOTE — Progress Notes (Signed)
Inpatient Rehab Admissions Coordinator:   Awaiting determination from [facility] Medicare regarding appeal.  Will continue to follow.   Estill Dooms, PT, DPT Admissions Coordinator [phone] 10/06/21  11:15 AM

## 2021-10-07 ENCOUNTER — Inpatient Hospital Stay (HOSPITAL_COMMUNITY): Payer: Medicare Other

## 2021-10-07 LAB — GLUCOSE, CAPILLARY
Glucose-Capillary: 109 mg/dL — ABNORMAL HIGH (ref 70–99)
Glucose-Capillary: 117 mg/dL — ABNORMAL HIGH (ref 70–99)
Glucose-Capillary: 122 mg/dL — ABNORMAL HIGH (ref 70–99)
Glucose-Capillary: 126 mg/dL — ABNORMAL HIGH (ref 70–99)
Glucose-Capillary: 134 mg/dL — ABNORMAL HIGH (ref 70–99)
Glucose-Capillary: 139 mg/dL — ABNORMAL HIGH (ref 70–99)

## 2021-10-07 MED ORDER — HYDROCORTISONE (PERIANAL) 2.5 % EX CREA
TOPICAL_CREAM | Freq: Two times a day (BID) | CUTANEOUS | Status: DC
  Administered 2021-10-07: 1 via RECTAL
  Filled 2021-10-07: qty 28.35

## 2021-10-07 MED ORDER — JEVITY 1.2 CAL PO LIQD
1000.0000 mL | ORAL | Status: DC
  Administered 2021-10-08: 1000 mL

## 2021-10-07 MED ORDER — LIDOCAINE 5 % EX PTCH
1.0000 | MEDICATED_PATCH | CUTANEOUS | Status: DC
  Administered 2021-10-07 – 2021-10-09 (×3): 1 via TRANSDERMAL
  Filled 2021-10-07 (×4): qty 1
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
## 2021-10-08 LAB — COMPREHENSIVE METABOLIC PANEL
ALT: 49 U/L — ABNORMAL HIGH (ref 0–44)
AST: 34 U/L (ref 15–41)
Albumin: 2.6 g/dL — ABNORMAL LOW (ref 3.5–5.0)
Alkaline Phosphatase: 109 U/L (ref 38–126)
Anion gap: 7 (ref 5–15)
BUN: 25 mg/dL — ABNORMAL HIGH (ref 8–23)
CO2: 26 mmol/L (ref 22–32)
Calcium: 8.7 mg/dL — ABNORMAL LOW (ref 8.9–10.3)
Chloride: 102 mmol/L (ref 98–111)
Creatinine, Ser: 0.6 mg/dL — ABNORMAL LOW (ref 0.61–1.24)
GFR, Estimated: >60 mL/min (ref >60)
Glucose, Bld: 132 mg/dL — ABNORMAL HIGH (ref 70–99)
Potassium: 3.7 mmol/L (ref 3.5–5.1)
Sodium: 135 mmol/L (ref 135–145)
Total Bilirubin: 0.7 mg/dL (ref 0.3–1.2)
Total Protein: 6.2 g/dL — ABNORMAL LOW (ref 6.5–8.1)

## 2021-10-08 LAB — GLUCOSE, CAPILLARY
Glucose-Capillary: 103 mg/dL — ABNORMAL HIGH (ref 70–99)
Glucose-Capillary: 108 mg/dL — ABNORMAL HIGH (ref 70–99)
Glucose-Capillary: 111 mg/dL — ABNORMAL HIGH (ref 70–99)
Glucose-Capillary: 122 mg/dL — ABNORMAL HIGH (ref 70–99)
Glucose-Capillary: 123 mg/dL — ABNORMAL HIGH (ref 70–99)
Glucose-Capillary: 129 mg/dL — ABNORMAL HIGH (ref 70–99)
Glucose-Capillary: 134 mg/dL — ABNORMAL HIGH (ref 70–99)

## 2021-10-08 LAB — CBC
HCT: 38.1 % — ABNORMAL LOW (ref 39.0–52.0)
Hemoglobin: 12.2 g/dL — ABNORMAL LOW (ref 13.0–17.0)
MCH: 28.9 pg (ref 26.0–34.0)
MCHC: 32 g/dL (ref 30.0–36.0)
MCV: 90.3 fL (ref 80.0–100.0)
Platelets: 321 10*3/uL (ref 150–400)
RBC: 4.22 MIL/uL (ref 4.22–5.81)
RDW: 15 % (ref 11.5–15.5)
WBC: 8.5 10*3/uL (ref 4.0–10.5)
nRBC: 0 % (ref 0.0–0.2)

## 2021-10-08 LAB — MAGNESIUM: Magnesium: 2 mg/dL (ref 1.7–2.4)

## 2021-10-08 LAB — PHOSPHORUS: Phosphorus: 3.3 mg/dL (ref 2.5–4.6)

## 2021-10-08 LAB — CK: Total CK: 33 U/L — ABNORMAL LOW (ref 49–397)

## 2021-10-08 MED ORDER — PROSOURCE TF PO LIQD
45.0000 mL | Freq: Three times a day (TID) | ORAL | Status: DC
  Administered 2021-10-08 – 2021-10-10 (×6): 45 mL
  Filled 2021-10-08 (×8): qty 45

## 2021-10-08 MED ORDER — TAMSULOSIN HCL 0.4 MG PO CAPS
0.4000 mg | ORAL_CAPSULE | Freq: Every day | ORAL | Status: DC
  Administered 2021-10-08 – 2021-10-09 (×2): 0.4 mg via ORAL
  Filled 2021-10-08 (×2): qty 1

## 2021-10-08 MED ORDER — LOPERAMIDE HCL 2 MG PO CAPS
2.0000 mg | ORAL_CAPSULE | Freq: Four times a day (QID) | ORAL | Status: DC | PRN
  Administered 2021-10-08: 2 mg via ORAL
  Filled 2021-10-08: qty 1

## 2021-10-08 MED ORDER — ENOXAPARIN SODIUM 40 MG/0.4ML IJ SOSY
40.0000 mg | PREFILLED_SYRINGE | INTRAMUSCULAR | Status: DC
  Administered 2021-10-08 – 2021-10-09 (×2): 40 mg via SUBCUTANEOUS
  Filled 2021-10-08 (×2): qty 0.4

## 2021-10-08 MED ORDER — SACCHAROMYCES BOULARDII 250 MG PO CAPS
250.0000 mg | ORAL_CAPSULE | Freq: Two times a day (BID) | ORAL | Status: DC
  Administered 2021-10-08 – 2021-10-10 (×4): 250 mg via ORAL
  Filled 2021-10-08 (×4): qty 1

## 2021-10-08 MED ORDER — JEVITY 1.2 CAL PO LIQD
1000.0000 mL | ORAL | Status: DC
  Administered 2021-10-08 – 2021-10-09 (×2): 1000 mL

## 2021-10-08 MED ORDER — HYDROCORTISONE ACETATE 25 MG RE SUPP
25.0000 mg | Freq: Two times a day (BID) | RECTAL | Status: DC
  Administered 2021-10-08 – 2021-10-10 (×5): 25 mg via RECTAL
  Filled 2021-10-08 (×6): qty 1

## 2021-10-08 NOTE — Progress Notes (Signed)
Occupational Therapy Treatment Patient Details Name: Raymond Stuart MRN: [id number] DOB: July 02, 1951 Today's Date: 10/08/2021   History of present illness 70 y.o. male admitted for sepsis secondary likely aspiration pnuemonia with UTI. H/O TBI, intraparenchymal hemorrhage, ischemic stroke and admitted to [facility]in ~07/2021  The day after his discharge, he was admitted to [facility] with fever,with  a prolonged, 31-day hospitalization.  S/P  G-tube placed , He was ultimately discharged to skilled nursing facility 09/26/2021. On 09/27/2021 He had an episode of emesis, witnessed aspiration event.  He was sent to the ED with a new oxygen requirement and fever.   PMH significant for GERD, OA, .   OT comments  Patients session was limited to bed with increased loose BM.Patient and wife were educated on importance of patient assisting with rolling as much as possible to prevent learned helplessness and improve strength. Patient verbalized understanding. Patient's discharge plan remains appropriate at this time. OT will continue to follow acutely.     Recommendations for follow up therapy are one component of a multi-disciplinary discharge planning process, led by the attending physician.  Recommendations may be updated based on patient status, additional functional criteria and insurance authorization.    Follow Up Recommendations  Acute inpatient rehab (3hours/day)    Assistance Recommended at Discharge    Equipment Recommendations  Other (comment) (defer to next venue)    Recommendations for Other Services      Precautions / Restrictions Precautions Precautions: Fall Precaution Comments: g-tube, catheter, orthostatic, left hip pain Restrictions Weight Bearing Restrictions: No       Mobility Bed Mobility Overal bed mobility: Needs Assistance Bed Mobility: Rolling;Sidelying to Sit Rolling: Mod assist              Transfers                         Balance                                            ADL either performed or assessed with clinical judgement   ADL Overall ADL's : Needs assistance/impaired                                       General ADL Comments: attempted to participate in sitting on edge of bed for grooming tasks with patient reporting need to have BM. patient was mod A to roll to each side in bed to complete hygiene tasks. patient upon second attempt to sit on edge of bed reported he needed to void again. patient was noted to have had another loose BM. session was ended with nurse in room to assist with hygiene tasks.patients wife inquired about CIR v.s. home with [facility]. patients wife was educated on differences of each. patients wife reported she still wants CIR for sure at this time.    Extremity/Trunk Assessment              Vision       Perception     Praxis      Cognition Arousal/Alertness: Awake/alert Behavior During Therapy: Flat affect Overall Cognitive Status: Within Functional Limits for tasks assessed  Exercises     Shoulder Instructions       General Comments      Pertinent Vitals/ Pain       Pain Assessment: Faces Faces Pain Scale: Hurts little more Pain Location: L knee, R hip Pain Descriptors / Indicators: Grimacing Pain Intervention(s): Repositioned;Monitored during session;Premedicated before session  Home Living                                          Prior Functioning/Environment              Frequency  Min 2X/week        Progress Toward Goals  OT Goals(current goals can now be found in the care plan section)  Progress towards OT goals: Progressing toward goals     Plan Discharge plan remains appropriate    Co-evaluation                 AM-PAC OT "6 Clicks" Daily Activity     Outcome Measure   Help from another person eating meals?: Total (NPO) Help  from another person taking care of personal grooming?: A Lot Help from another person toileting, which includes using toliet, bedpan, or urinal?: Total Help from another person bathing (including washing, rinsing, drying)?: A Lot Help from another person to put on and taking off regular upper body clothing?: A Lot Help from another person to put on and taking off regular lower body clothing?: Total 6 Click Score: 9    End of Session    OT Visit Diagnosis: Unsteadiness on feet (R26.81);Other abnormalities of gait and mobility (R26.89);Muscle weakness (generalized) (M62.81);History of falling (Z91.81);Pain;Hemiplegia and hemiparesis Hemiplegia - Right/Left: Left Hemiplegia - dominant/non-dominant: Non-Dominant Hemiplegia - caused by: Cerebral infarction Pain - Right/Left: Left Pain - part of body: Hip;Knee   Activity Tolerance Other (comment) (loose BMs limiting session)   Patient Left in bed;with call bell/phone within reach;with family/visitor present   Nurse Communication Other (comment) (patients need to be cleaned up)        Time: 5643-3295 OT Time Calculation (min): 26 min  Charges: OT General Charges $OT Visit: 1 Visit OT Treatments $Self Care/Home Management : 23-37 mins  Sharyn Blitz OTR/L, MS Acute Rehabilitation Department Office# [phone] Pager# [phone]   [place] Guest Stefanee Mckell 10/08/2021, 4:08 PM
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
## 2021-10-08 NOTE — Progress Notes (Signed)
Nutrition Follow-up  DOCUMENTATION CODES:   Severe malnutrition in context of acute illness/injury  INTERVENTION:   -Jevity 1.5 currently out of stock -Switch to Jevity 1.2 @ 70 ml/hr via PEG -45 ml Prosource TF TID -150 ml free water every 4 hours (900 ml) -Regimen will provide 2096 kcal, 115 grams protein, and 2255 ml free water  -Juven BID via PEG, each serving provides 95kcal and 2.5g of protein (amino acids glutamine and arginine)     - will monitor for ability for diet advancement and will adjust TF accordingly dependent on PO intakes.   NUTRITION DIAGNOSIS:   Severe Malnutrition related to acute illness (fall on 9/26 with subsequent hemorrages and strokes) as evidenced by moderate fat depletion, moderate muscle depletion, severe muscle depletion, percent weight loss.  Ongoing.  GOAL:   Patient will meet greater than or equal to 90% of their needs  Progressing.  MONITOR:   Diet advancement, TF tolerance, Labs, Weight trends, Skin  REASON FOR ASSESSMENT:   Consult Enteral/tube feeding initiation and management  ASSESSMENT:   70 y.o. male with medical history of GERD, HLD, osteoarthritis, and urolithiasis. He had a fall on 08/21/21 which caused subdural hematoma, traumatic intraparenchymal hemorrhage, ventricular hemorrhage in the setting of acute head injury with an occipital fracture, ischemic stroke, followed by aspiration pneumonia in the setting of stroke dysphagia, PEG placement, transaminitis, AKI, acute urinary retention with indwelling Foley catheter. He presented to the [facility] ED from [facility], where he was for <24 hours, after an episode of vomiting, fever of 101.2 F, tachypneic/dyspneic and hypoxic in the 80s.  RD consulted as Jevity 1.5 is now out of stock. TF was switched to Jevity 1.2. RD changed rate to better meet estimated needs and increased Prosource to TID.   Continues to be NPO.Will continue to monitor for diet advancement.  Admission weight:  175 lbs. Current weight: 181 lbs.  Medications: Nystatin  Labs reviewed:  CBGs: 108-134  Diet Order:   Diet Order             Diet NPO time specified Except for: Ice Chips  Diet effective now                   EDUCATION NEEDS:   No education needs have been identified at this time  Skin:  Skin Assessment: Skin Integrity Issues: Skin Integrity Issues:: Stage II, DTI DTI: L buttocks Stage II: coccyx  Last BM:  11/13 -type 6  Height:   Ht Readings from Last 1 Encounters:  09/27/21 6' (1.829 m)    Weight:   Wt Readings from Last 1 Encounters:  10/08/21 82.5 kg    Ideal Body Weight:  80.9 kg  BMI:  Body mass index is 24.67 kg/m.  Estimated Nutritional Needs:   Kcal:  2225-2540 kcal  Protein:  120-135 grams  Fluid:  >/= 2.4 L/day  Tilda Franco, MS, RD, LDN Inpatient Clinical Dietitian Contact information available via Amion

## 2021-10-09 ENCOUNTER — Inpatient Hospital Stay (HOSPITAL_COMMUNITY): Payer: Medicare Other

## 2021-10-09 LAB — COMPREHENSIVE METABOLIC PANEL
ALT: 46 U/L — ABNORMAL HIGH (ref 0–44)
AST: 29 U/L (ref 15–41)
Albumin: 2.8 g/dL — ABNORMAL LOW (ref 3.5–5.0)
Alkaline Phosphatase: 110 U/L (ref 38–126)
Anion gap: 11 (ref 5–15)
BUN: 26 mg/dL — ABNORMAL HIGH (ref 8–23)
CO2: 23 mmol/L (ref 22–32)
Calcium: 9 mg/dL (ref 8.9–10.3)
Chloride: 102 mmol/L (ref 98–111)
Creatinine, Ser: 0.55 mg/dL — ABNORMAL LOW (ref 0.61–1.24)
GFR, Estimated: >60 mL/min (ref >60)
Glucose, Bld: 134 mg/dL — ABNORMAL HIGH (ref 70–99)
Potassium: 3.9 mmol/L (ref 3.5–5.1)
Sodium: 136 mmol/L (ref 135–145)
Total Bilirubin: 0.5 mg/dL (ref 0.3–1.2)
Total Protein: 6.9 g/dL (ref 6.5–8.1)

## 2021-10-09 LAB — GLUCOSE, CAPILLARY
Glucose-Capillary: 118 mg/dL — ABNORMAL HIGH (ref 70–99)
Glucose-Capillary: 113 mg/dL — ABNORMAL HIGH (ref 70–99)
Glucose-Capillary: 109 mg/dL — ABNORMAL HIGH (ref 70–99)
Glucose-Capillary: 142 mg/dL — ABNORMAL HIGH (ref 70–99)
Glucose-Capillary: 158 mg/dL — ABNORMAL HIGH (ref 70–99)
Glucose-Capillary: 157 mg/dL — ABNORMAL HIGH (ref 70–99)

## 2021-10-09 LAB — CBC
HCT: 41.6 % (ref 39.0–52.0)
Hemoglobin: 13.4 g/dL (ref 13.0–17.0)
MCH: 28.7 pg (ref 26.0–34.0)
MCHC: 32.2 g/dL (ref 30.0–36.0)
MCV: 89.1 fL (ref 80.0–100.0)
Platelets: 369 10*3/uL (ref 150–400)
RBC: 4.67 MIL/uL (ref 4.22–5.81)
RDW: 15.5 % (ref 11.5–15.5)
WBC: 9.8 10*3/uL (ref 4.0–10.5)
nRBC: 0 % (ref 0.0–0.2)

## 2021-10-09 MED ORDER — NYSTATIN 100000 UNIT/ML MT SUSP: 5.0000 mL | Freq: Four times a day (QID) | OROMUCOSAL | 0 refills | Status: DC

## 2021-10-09 MED ORDER — OXYCODONE HCL 5 MG PO TABS
5.0000 mg | ORAL_TABLET | Freq: Once | ORAL | Status: AC
  Administered 2021-10-09: 5 mg via ORAL

## 2021-10-09 MED ORDER — DICLOFENAC SODIUM 1 % EX GEL
2.0000 g | Freq: Four times a day (QID) | CUTANEOUS | Status: DC
Start: 2021-10-09 — End: 2021-11-04

## 2021-10-09 MED ORDER — LOPERAMIDE HCL 2 MG PO CAPS: 2.0000 mg | ORAL_CAPSULE | Freq: Four times a day (QID) | ORAL | 0 refills | Status: DC | PRN

## 2021-10-09 MED ORDER — LIDOCAINE 5 % EX PTCH
1.0000 | MEDICATED_PATCH | CUTANEOUS | 0 refills | Status: DC
Start: 2021-10-09 — End: 2021-11-04

## 2021-10-09 MED ORDER — ALUM & MAG HYDROXIDE-SIMETH 200-200-20 MG/5ML PO SUSP
15.0000 mL | Freq: Once | ORAL | Status: DC
  Filled 2021-10-09: qty 30

## 2021-10-09 MED ORDER — GUAIFENESIN 100 MG/5ML PO LIQD: 5.0000 mL | ORAL | 0 refills | Status: DC | PRN

## 2021-10-09 MED ORDER — JEVITY 1.2 CAL PO LIQD: 1000.0000 mL | ORAL | 0 refills | Status: DC

## 2021-10-09 MED ORDER — JUVEN PO PACK
1.0000 | PACK | Freq: Two times a day (BID) | ORAL | 0 refills | Status: DC
Start: 2021-10-09 — End: 2021-11-04

## 2021-10-09 MED ORDER — HYDROCORTISONE ACETATE 25 MG RE SUPP: 25.0000 mg | Freq: Two times a day (BID) | RECTAL | 0 refills | Status: DC

## 2021-10-09 MED ORDER — PROSOURCE TF PO LIQD
45.0000 mL | Freq: Three times a day (TID) | ORAL | Status: DC
Start: 2021-10-09 — End: 2021-11-04

## 2021-10-09 MED ORDER — SACCHAROMYCES BOULARDII 250 MG PO CAPS: 250.0000 mg | ORAL_CAPSULE | Freq: Two times a day (BID) | ORAL | Status: DC

## 2021-10-09 MED ORDER — FREE WATER: 150.0000 mL | Status: DC

## 2021-10-09 NOTE — Discharge Summary (Addendum)
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 Pharyngeal- Thin Cup Delayed swallow initiation-vallecula;Reduced pharyngeal peristalsis;Reduced epiglottic inversion;Reduced airway/laryngeal closure;Penetration/Aspiration during swallow;Penetration/Apiration after swallow;Trace aspiration;Pharyngeal residue - valleculae;Pharyngeal residue - pyriform Pharyngeal Material enters airway, passes BELOW cords and not ejected out despite cough attempt by patient Pharyngeal- Thin Straw Delayed swallow initiation-vallecula;Reduced pharyngeal peristalsis;Reduced airway/laryngeal closure;Reduced tongue base retraction;Penetration/Aspiration during swallow;Penetration/Apiration after swallow;Trace aspiration;Pharyngeal residue - valleculae;Pharyngeal residue - pyriform Pharyngeal Material enters airway, passes BELOW cords and not ejected out despite cough attempt by patient Pharyngeal- Puree Delayed swallow initiation-vallecula;Reduced pharyngeal peristalsis;Reduced epiglottic inversion;Reduced tongue base retraction;Pharyngeal residue - valleculae;Pharyngeal residue - pyriform Pharyngeal -- Pharyngeal- Mechanical Soft -- Pharyngeal -- Pharyngeal- Regular Delayed swallow initiation-vallecula;Reduced  pharyngeal peristalsis;Reduced epiglottic inversion;Pharyngeal residue - valleculae;Pharyngeal residue - pyriform Pharyngeal -- Pharyngeal- Multi-consistency -- Pharyngeal -- Pharyngeal- Pill -- Pharyngeal -- Pharyngeal Comment --  No flowsheet data found. Blenda Mounts Laurice 09/29/2021, 4:29 PM              DG HIP UNILAT WITH PELVIS 1V LEFT  Result Date: 10/07/2021 CLINICAL DATA:  Left hip and knee pain EXAM: DG HIP (WITH OR WITHOUT PELVIS) 1V*L* COMPARISON:  None. FINDINGS: Status post right hip arthroplasty with intact hardware. Mild left hip joint space narrowing. No evidence fracture or dislocation. IMPRESSION: No acute osseous abnormality of the left hip joint. Electronically Signed   By: Larose Hires D.O.   On: 10/07/2021 13:11   US Abdomen Limited RUQ (LIVER/GB)  Result Date: 10/02/2021 CLINICAL DATA:  Abnormal LFTs. EXAM: ULTRASOUND ABDOMEN LIMITED RIGHT UPPER QUADRANT COMPARISON:  Right upper quadrant ultrasound dated 09/17/2021. FINDINGS: Gallbladder: There is tumefactive sludge within the gallbladder. No shadowing stone. No gallbladder wall thickening or pericholecystic fluid. Negative sonographic Murphy's sign. Common bile duct: Diameter: 3 mm Liver: No focal lesion identified. Within normal limits in parenchymal echogenicity. Portal vein is patent on color Doppler imaging with normal direction of blood flow towards the liver. Other: None. IMPRESSION: Gallbladder sludge, otherwise unremarkable right upper quadrant ultrasound. Electronically Signed   By: Elgie Collard M.D.   On: 10/02/2021 20:27      Subjective: Patient seen and examined the bedside this morning.  Hemodynamically stable.  Comfortable.  Denies any new complaints.  Wife at bedside.  Discharge Exam: Vitals:   10/09/21 2028 10/10/21 0452  BP: 118/75 116/80  Pulse: (!) 102 96  Resp: 18 18  Temp: 97.7 F (36.5 C) 98 F (36.7 C)  SpO2: 95% 94%   Vitals:   10/09/21 0411 10/09/21 0416 10/09/21 2028 10/10/21 0452   BP: 127/87  118/75 116/80  Pulse: (!) 103  (!) 102 96  Resp:   18 18  Temp: 98.1 F (36.7 C)  97.7 F (36.5 C) 98 F (36.7 C)  TempSrc: Oral  Oral Oral  SpO2: 91%  95% 94%  Weight:  81.9 kg  79.9 kg  Height:        General: Pt is alert, awake, not in acute distress Cardiovascular: RRR, S1/S2 +, no rubs, no gallops Respiratory: CTA bilaterally, no wheezing, no rhonchi Abdominal: Soft, NT, ND, bowel sounds +,PEG Extremities: no edema, no cyanosis    The results of significant diagnostics from this hospitalization (including imaging, microbiology, ancillary and laboratory) are listed below for reference.     Microbiology: No results found for this or any previous visit (from the past 240 hour(s)).   Labs: BNP (last 3 results) No results for input(s): BNP in the last 8760 hours. Basic Metabolic Panel: Recent Labs  Lab 10/05/21 0522 10/06/21 1143 10/08/21 0553 10/09/21 0517  NA 133* 135 135 136  K 3.8 3.8 3.7 3.9  CL 103 103 102 102  CO2 22 25 26 23   GLUCOSE 122* 113* 132* 134*  BUN 23 27* 25* 26*  CREATININE 0.54* 0.61 0.60* 0.55*  CALCIUM 8.4* 8.4* 8.7* 9.0  MG 2.0  --  2.0  --   PHOS  --   --  3.3  --    Liver Function Tests: Recent Labs  Lab 10/05/21 0522 10/06/21 1143 10/08/21 0553 10/09/21 0517  AST 32 29 34 29  ALT 54* 46* 49* 46*  ALKPHOS 126 112 109 110  BILITOT 0.6 0.4 0.7 0.5  PROT 6.3* 6.2* 6.2* 6.9  ALBUMIN 2.5* 2.5* 2.6* 2.8*   No results for input(s): LIPASE, AMYLASE in the last 168 hours. No results for input(s): AMMONIA in the last 168 hours. CBC: Recent Labs  Lab 10/06/21 1143 10/08/21 0553 10/09/21 0517  WBC 11.6* 8.5 9.8  HGB 12.2* 12.2* 13.4  HCT 37.4* 38.1* 41.6  MCV 88.4 90.3 89.1  PLT 322 321 369   Cardiac Enzymes: Recent Labs  Lab 10/08/21 0553  CKTOTAL 33*   BNP: Invalid input(s): POCBNP CBG: Recent Labs  Lab 10/09/21 2031 10/10/21 0004 10/10/21 0450 10/10/21 0758 10/10/21 1226  GLUCAP 157* 102* 116*  153* 142*   D-Dimer No results for input(s): DDIMER in the last 72 hours. Hgb A1c No results for input(s): HGBA1C in the last 72 hours. Lipid Profile No results for input(s): CHOL, HDL, LDLCALC, TRIG, CHOLHDL, LDLDIRECT in the last 72 hours. Thyroid function studies No results for input(s): TSH, T4TOTAL, T3FREE, THYROIDAB in the last 72 hours.  Invalid input(s): FREET3 Anemia work up No results for input(s): VITAMINB12, FOLATE, FERRITIN, TIBC, IRON, RETICCTPCT in the last 72 hours. Urinalysis    Component Value Date/Time   COLORURINE AMBER (A) 09/27/2021 0648   APPEARANCEUR CLOUDY (A) 09/27/2021 0648   LABSPEC 1.026 09/27/2021 0648   PHURINE 5.0 09/27/2021 0648   GLUCOSEU NEGATIVE 09/27/2021 0648   HGBUR LARGE (A) 09/27/2021 0648   BILIRUBINUR NEGATIVE 09/27/2021 0648   KETONESUR NEGATIVE 09/27/2021 0648   PROTEINUR 100 (A) 09/27/2021 0648   UROBILINOGEN 1.0  07/17/2013 2249   NITRITE NEGATIVE 09/27/2021 0648   LEUKOCYTESUR MODERATE (A) 09/27/2021 0648   Sepsis Labs Invalid input(s): PROCALCITONIN,  WBC,  LACTICIDVEN Microbiology No results found for this or any previous visit (from the past 240 hour(s)).  Please note: You were cared for by a hospitalist during your hospital stay. Once you are discharged, your primary care physician will handle any further medical issues. Please note that NO REFILLS for any discharge medications will be authorized once you are discharged, as it is imperative that you return to your primary care physician (or establish a relationship with a primary care physician if you do not have one) for your post hospital discharge needs so that they can reassess your need for medications and monitor your lab values.    Time coordinating discharge: 40 minutes  SIGNED:   Burnadette Pop, MD  [facility] 10/10/2021, 3:18 PM Pager [phone]  If 7PM-7AM, please contact night-coverage [email or web address] Password [email or web address]

## 2021-10-09 NOTE — Progress Notes (Signed)
Modified Barium Swallow Progress Note  Patient Details  Name: Murice Barbar MRN: [id number] Date of Birth: 1951/06/08  Today's Date: 10/09/2021  Modified Barium Swallow completed.  Full report located under Chart Review in the Imaging Section.  Brief recommendations include the following:  Clinical Impression  Patient demonstrated a significant improvement in his swallow function during today's MBS as compared to MBS on 11/4. During today's MBS, he presents with a mild-moderate oral and a mild pharygneal phase dysphagia. During his oral phase of swallow, he exhibited anterior to posterior transit delays with all tested solid and liquid consistencies and decreased bolus cohesion with regular texture solids. During pharyngeal phase, he exhibited swallow initiation delay to vallecular sinus with nectar thick and thin liquid consistencies. He exhibited instances of penetration (PAS 2) of trace amount of thin liquid barium during the swallow, however no aspiration observed with thin liquid, nectar thick or honey thick liquids during any phase of the swallow. Heavier, more viscous boluses such as regular texture solids and puree solids resulted in min-mod vallecular sinus residuals and min pyriform sinus residuals and with thinner boluses such as thin liquid barium, only trace amount of vallecular and pyriform sinus residuals was observed. When taken with puree solids, barium tablet became briefly lodged in vallecular sinus and puree solids and thin liquids were both unsuccessful in dislodging it. Honey thick liquid sip did help to transit barium tablet out of vallecular sinus and through pharynx. Sips of thin liquid barium then did help to clear majority of vallecular and pyriform sinus residuals from honey thick liquid barium. SLP is recommending to initiate PO diet of Dys 1 solids, thin liquids and meds crushed in puree or via PEG.   Swallow Evaluation Recommendations       SLP Diet Recommendations:  Dysphagia 1 (Puree) solids;Thin liquid   Liquid Administration via: Cup;Straw   Medication Administration: Crushed with puree   Supervision: Patient able to self feed;Full supervision/cueing for compensatory strategies   Compensations: Minimize environmental distractions;Follow solids with liquid;Small sips/bites;Slow rate   Postural Changes: Seated upright at 90 degrees   Oral Care Recommendations: Oral care BID;Staff/trained caregiver to provide oral care        Angela Nevin, MA, CCC-SLP Speech Therapy

## 2021-10-09 NOTE — Progress Notes (Signed)
Speech Language Pathology Treatment: Dysphagia  Patient Details Name: Slayton Lubitz MRN: [id number] DOB: 1951/01/05 Today's Date: 10/09/2021 Time: 0945-1000 SLP Time Calculation (min) (ACUTE ONLY): 15 min  Assessment / Plan / Recommendation Clinical Impression  Patient seen with wife in room for dysphgia treatment with focus on thin liquid and ice chip trials to help determine readiness for repeat MBS. Patient was awake and alert, agreeable to session. After SLP and NT repositioned patient in bed for upright position, he accepted 3 ice chips followed by approximately 5-6 cup sips of thin liquids (water). Patient did not exhibit any coughing or throat clearing and no change was observed in vocal quality. He did exhibited suspected delayed swallow initiation and multiple swallows. SLP discussed with patient and his wife and both agreeable to him having repeat MBS. (most recent was 11/4).    HPI HPI: Shadee Montoya is a 70 y.o. male with medical history significant for GERD hyperlipidemia, osteoarthritis, urolithiasis who was recently admitted to [facility] (9/26-29) after falling, hitting his head on concrete and sustaining a large right frontal intraparenchymal hemorrhage, large intraventricular hemorrhage, SDH and right occipital bone fx. PEG 10/27.  D/Cd home, 9/30 admitted to [facility] then D/Cd  to SNF for rehab on 11/1.  Pt was transferred from his facility after being there for less than 24 hours to [facility] after being found covered in vomit, febrile at 101.2 F,, tachypneic/dyspneic and hypoxic in the 80s.  Per Md notes, "he responded to [facility] oxygen at 10 LPM then subsequently brought to the emergency department.  At the facility, he was started getting tube feeding around 2300 last night.  He has been bedbound.  A significant portion of the history is taken from his wife, but he was able to say a few things and answer simple questions."  Swallow eval ordered.  Pt has hx of dysphagia diagnosed initially 10/4 and mech  soft diet ordered, mentation waxed/waned and pt was made npo with Dobhoff.  MBS then conducted 10/19 and 11/1 - recommendation was npo x frazier water protocol.  Large barrier to adequate po intake has been his mentation.    Recs from 11/1 MBS were npo and ice chips with aggressive SLP at next venue of care.  11/3 clinical swallow evaluation recommended NPO except ice chips and repeat MBS. Repeat MBS completed 11/4 with recommendation to continue NPO except for ice/water PRN and nectar thick liquid trials with SLP only.      SLP Plan  Continue with current plan of care;MBS      Recommendations for follow up therapy are one component of a multi-disciplinary discharge planning process, led by the attending physician.  Recommendations may be updated based on patient status, additional functional criteria and insurance authorization.    Recommendations  Diet recommendations: NPO Medication Administration: Via alternative means Supervision: Patient able to self feed Compensations: Minimize environmental distractions;Multiple dry swallows after each bite/sip Postural Changes and/or Swallow Maneuvers: Seated upright 90 degrees            Angela Nevin, MA, CCC-SLP Speech Therapy

## 2021-10-09 NOTE — Progress Notes (Signed)
Patient c/o severe pain in sacrum despite PRN tylenol. MD paged for an extra dose of oxy 5mg .

## 2021-10-09 NOTE — Progress Notes (Signed)
Inpatient Rehab Admissions Coordinator:   I have received insurance approval for CIR. I do not have a bed for him today but will follow for admit as soon as we have a bed available.  I called his wife, [name], and updated her.    Estill Dooms, PT, DPT Admissions Coordinator [phone] 10/09/21  10:41 AM

## 2021-10-09 NOTE — Progress Notes (Signed)
Patient has been unable to void, bladder scan showed 295 mL. MD paged, orders to replace foley

## 2021-10-09 NOTE — Progress Notes (Signed)
Physical Therapy Treatment Patient Details Name: Demon Volante MRN: [id number] DOB: 19-Jan-1951 Today's Date: 10/09/2021   History of Present Illness 70 y.o. male admitted for sepsis secondary likely aspiration pnuemonia with UTI. H/O TBI, intraparenchymal hemorrhage, ischemic stroke and admitted to [facility].in ~07/2021  The day after his discharge, he was admitted to [facility] with fever,with  a prolonged, 31-day hospitalization.  S/P  G-tube placed , He was ultimately discharged to skilled nursing facility 09/26/2021. On 09/27/2021 He had an episode of emesis, witnessed aspiration event.  He was sent to the ED with a new oxygen requirement and fever.   PMH significant for GERD, OA, .    PT Comments    Pt is required decreased assistance for mobility, improved ability to participate with sidelying to sit, and sit to stand. Pt also tolerated longer standing time of 30 seconds in [place]. Vital signs stable today, no orthostatic hypotension with position changes. He remains an excellent CIR candidate.      Recommendations for follow up therapy are one component of a multi-disciplinary discharge planning process, led by the attending physician.  Recommendations may be updated based on patient status, additional functional criteria and insurance authorization.  Follow Up Recommendations  Acute inpatient rehab (3hours/day)     Assistance Recommended at Discharge Frequent or constant Supervision/Assistance  Equipment Recommendations  Other (comment) (TBD @ CIR)    Recommendations for Other Services Rehab consult     Precautions / Restrictions Precautions Precautions: Fall Precaution Comments: g-tube, orthostatic, left hip pain Restrictions Weight Bearing Restrictions: No     Mobility  Bed Mobility Overal bed mobility: Needs Assistance Bed Mobility: Rolling;Sidelying to Sit Rolling: Min assist Sidelying to sit: Mod assist       General bed mobility comments: required physical  assist to bring LEs off side of bed and power up into sitting.    Transfers Overall transfer level: Needs assistance   Transfers: Sit to/from Stand;Bed to chair/wheelchair/BSC Sit to Stand: +2 physical assistance;+2 safety/equipment;From elevated surface;Mod assist Stand pivot transfers: Total assist;+2 physical assistance         General transfer comment: +2 mod A (pt 60%) to rise from elevated bed, pt actively assisted with  pulling up on bar on Stedy; pt stood with active weight bearing through BLEs (~30 seconds+ 10 seconds + 10 seconds) x 3 trials then fatigued and sat on flaps of Stedy. Transfer via Lift Equipment: Stedy  Ambulation/Gait                   Stairs             Wheelchair Mobility    Modified Rankin (Stroke Patients Only)       Balance Overall balance assessment: Needs assistance Sitting-balance support: No upper extremity supported;Feet supported Sitting balance-Leahy Scale: Fair Sitting balance - Comments: BUE support to maintain sitting balance. Sat EOB for ~8 minutes. Mild posterior lean intially, then fair.                                    Cognition Arousal/Alertness: Awake/alert Behavior During Therapy: Flat affect Overall Cognitive Status: Within Functional Limits for tasks assessed                               Problem Solving: Slow processing;Requires verbal cues General Comments: wife was present, pt continues with flat affect  and slightly anxious about OOB mobility, pt reassured.        Exercises General Exercises - Lower Extremity Ankle Circles/Pumps: AROM;Both;15 reps;Supine Short Arc Quad: AROM;Left;10 reps;Supine    General Comments        Pertinent Vitals/Pain Pain Assessment: Faces Faces Pain Scale: Hurts little more Pain Location: L hip, buttocks Pain Descriptors / Indicators: Grimacing;Aching Pain Intervention(s): Limited activity within patient's tolerance;Monitored during  session;Premedicated before session;Repositioned    Home Living                          Prior Function            PT Goals (current goals can now be found in the care plan section) Acute Rehab PT Goals Patient Stated Goal: to walk, return to doing yardwork, playing with 6 y.o. granddaughter PT Goal Formulation: With patient/family Time For Goal Achievement: 10/13/21 Potential to Achieve Goals: Good Progress towards PT goals: Progressing toward goals    Frequency    Min 3X/week      PT Plan Current plan remains appropriate    Co-evaluation PT/OT/SLP Co-Evaluation/Treatment: Yes            AM-PAC PT "6 Clicks" Mobility   Outcome Measure  Help needed turning from your back to your side while in a flat bed without using bedrails?: A Lot Help needed moving from lying on your back to sitting on the side of a flat bed without using bedrails?: A Lot Help needed moving to and from a bed to a chair (including a wheelchair)?: Total Help needed standing up from a chair using your arms (e.g., wheelchair or bedside chair)?: Total Help needed to walk in hospital room?: Total Help needed climbing 3-5 steps with a railing? : Total 6 Click Score: 8    End of Session Equipment Utilized During Treatment: Gait belt Activity Tolerance: Patient tolerated treatment well Patient left: with call bell/phone within reach;with family/visitor present;in chair;with chair alarm set Nurse Communication: Mobility status;Need for lift equipment PT Visit Diagnosis: Difficulty in walking, not elsewhere classified (R26.2);Muscle weakness (generalized) (M62.81);Pain;Unsteadiness on feet (R26.81) Pain - Right/Left: Left Pain - part of body: Hip     Time: 1400-1439 PT Time Calculation (min) (ACUTE ONLY): 39 min  Charges:  $Therapeutic Activity: 38-52 mins                     Ralene Bathe Kistler PT 10/09/2021  Acute Rehabilitation Services Pager [phone] Office  [phone]

## 2021-10-10 ENCOUNTER — Encounter (HOSPITAL_COMMUNITY): Payer: Self-pay | Admitting: Physical Medicine and Rehabilitation

## 2021-10-10 ENCOUNTER — Other Ambulatory Visit: Payer: Self-pay

## 2021-10-10 ENCOUNTER — Inpatient Hospital Stay (HOSPITAL_COMMUNITY)
Admission: RE | Admit: 2021-10-10 | Discharge: 2021-11-04 | DRG: 945 | Disposition: A | Discharge status: Home Health | Payer: Medicare Other | Source: Other Acute Inpatient Hospital | Attending: Physical Medicine and Rehabilitation | Admitting: Physical Medicine and Rehabilitation

## 2021-10-10 DIAGNOSIS — I824Z2 Acute embolism and thrombosis of unspecified deep veins of left distal lower extremity: Secondary | ICD-10-CM | POA: Diagnosis present

## 2021-10-10 DIAGNOSIS — N179 Acute kidney failure, unspecified: Secondary | ICD-10-CM | POA: Diagnosis present

## 2021-10-10 DIAGNOSIS — Z888 Allergy status to other drugs, medicaments and biological substances status: Secondary | ICD-10-CM | POA: Diagnosis not present

## 2021-10-10 DIAGNOSIS — E43 Unspecified severe protein-calorie malnutrition: Secondary | ICD-10-CM | POA: Diagnosis present

## 2021-10-10 DIAGNOSIS — I69391 Dysphagia following cerebral infarction: Secondary | ICD-10-CM

## 2021-10-10 DIAGNOSIS — I639 Cerebral infarction, unspecified: Secondary | ICD-10-CM | POA: Diagnosis present

## 2021-10-10 DIAGNOSIS — E785 Hyperlipidemia, unspecified: Secondary | ICD-10-CM | POA: Diagnosis present

## 2021-10-10 DIAGNOSIS — I825Y2 Chronic embolism and thrombosis of unspecified deep veins of left proximal lower extremity: Secondary | ICD-10-CM | POA: Diagnosis present

## 2021-10-10 DIAGNOSIS — R1310 Dysphagia, unspecified: Secondary | ICD-10-CM | POA: Diagnosis present

## 2021-10-10 DIAGNOSIS — Z7401 Bed confinement status: Secondary | ICD-10-CM

## 2021-10-10 DIAGNOSIS — A419 Sepsis, unspecified organism: Secondary | ICD-10-CM | POA: Diagnosis not present

## 2021-10-10 DIAGNOSIS — B370 Candidal stomatitis: Secondary | ICD-10-CM

## 2021-10-10 DIAGNOSIS — L89322 Pressure ulcer of left buttock, stage 2: Secondary | ICD-10-CM | POA: Diagnosis present

## 2021-10-10 DIAGNOSIS — Z8249 Family history of ischemic heart disease and other diseases of the circulatory system: Secondary | ICD-10-CM

## 2021-10-10 DIAGNOSIS — Z431 Encounter for attention to gastrostomy: Secondary | ICD-10-CM

## 2021-10-10 DIAGNOSIS — I82412 Acute embolism and thrombosis of left femoral vein: Secondary | ICD-10-CM | POA: Diagnosis present

## 2021-10-10 DIAGNOSIS — K219 Gastro-esophageal reflux disease without esophagitis: Secondary | ICD-10-CM | POA: Diagnosis present

## 2021-10-10 DIAGNOSIS — R339 Retention of urine, unspecified: Secondary | ICD-10-CM | POA: Diagnosis not present

## 2021-10-10 DIAGNOSIS — L89312 Pressure ulcer of right buttock, stage 2: Secondary | ICD-10-CM | POA: Diagnosis present

## 2021-10-10 DIAGNOSIS — B961 Klebsiella pneumoniae [K. pneumoniae] as the cause of diseases classified elsewhere: Secondary | ICD-10-CM | POA: Diagnosis present

## 2021-10-10 DIAGNOSIS — I69354 Hemiplegia and hemiparesis following cerebral infarction affecting left non-dominant side: Secondary | ICD-10-CM

## 2021-10-10 DIAGNOSIS — D62 Acute posthemorrhagic anemia: Secondary | ICD-10-CM | POA: Diagnosis not present

## 2021-10-10 DIAGNOSIS — N390 Urinary tract infection, site not specified: Secondary | ICD-10-CM | POA: Diagnosis present

## 2021-10-10 DIAGNOSIS — G479 Sleep disorder, unspecified: Secondary | ICD-10-CM

## 2021-10-10 DIAGNOSIS — W1830XD Fall on same level, unspecified, subsequent encounter: Secondary | ICD-10-CM | POA: Diagnosis not present

## 2021-10-10 DIAGNOSIS — E1165 Type 2 diabetes mellitus with hyperglycemia: Secondary | ICD-10-CM | POA: Diagnosis present

## 2021-10-10 DIAGNOSIS — Z7982 Long term (current) use of aspirin: Secondary | ICD-10-CM

## 2021-10-10 DIAGNOSIS — B379 Candidiasis, unspecified: Secondary | ICD-10-CM | POA: Diagnosis present

## 2021-10-10 DIAGNOSIS — R197 Diarrhea, unspecified: Secondary | ICD-10-CM | POA: Diagnosis present

## 2021-10-10 DIAGNOSIS — Z931 Gastrostomy status: Secondary | ICD-10-CM | POA: Diagnosis not present

## 2021-10-10 DIAGNOSIS — E871 Hypo-osmolality and hyponatremia: Secondary | ICD-10-CM

## 2021-10-10 DIAGNOSIS — I69319 Unspecified symptoms and signs involving cognitive functions following cerebral infarction: Secondary | ICD-10-CM | POA: Diagnosis not present

## 2021-10-10 DIAGNOSIS — R5381 Other malaise: Secondary | ICD-10-CM | POA: Diagnosis present

## 2021-10-10 DIAGNOSIS — Z79899 Other long term (current) drug therapy: Secondary | ICD-10-CM

## 2021-10-10 DIAGNOSIS — Z6822 Body mass index (BMI) 22.0-22.9, adult: Secondary | ICD-10-CM

## 2021-10-10 DIAGNOSIS — R609 Edema, unspecified: Secondary | ICD-10-CM | POA: Diagnosis not present

## 2021-10-10 DIAGNOSIS — R000 Tachycardia, unspecified: Secondary | ICD-10-CM | POA: Diagnosis not present

## 2021-10-10 LAB — GLUCOSE, CAPILLARY
Glucose-Capillary: 101 mg/dL — ABNORMAL HIGH (ref 70–99)
Glucose-Capillary: 102 mg/dL — ABNORMAL HIGH (ref 70–99)
Glucose-Capillary: 116 mg/dL — ABNORMAL HIGH (ref 70–99)
Glucose-Capillary: 133 mg/dL — ABNORMAL HIGH (ref 70–99)
Glucose-Capillary: 142 mg/dL — ABNORMAL HIGH (ref 70–99)
Glucose-Capillary: 153 mg/dL — ABNORMAL HIGH (ref 70–99)

## 2021-10-10 LAB — CREATININE, SERUM
Creatinine, Ser: 0.62 mg/dL (ref 0.61–1.24)
GFR, Estimated: >60 mL/min (ref >60)

## 2021-10-10 LAB — CBC
HCT: 39.3 % (ref 39.0–52.0)
Hemoglobin: 12.5 g/dL — ABNORMAL LOW (ref 13.0–17.0)
MCH: 28.7 pg (ref 26.0–34.0)
MCHC: 31.8 g/dL (ref 30.0–36.0)
MCV: 90.3 fL (ref 80.0–100.0)
Platelets: 352 10*3/uL (ref 150–400)
RBC: 4.35 MIL/uL (ref 4.22–5.81)
RDW: 15.3 % (ref 11.5–15.5)
WBC: 9.3 10*3/uL (ref 4.0–10.5)
nRBC: 0 % (ref 0.0–0.2)

## 2021-10-10 MED ORDER — NYSTATIN 100000 UNIT/ML MT SUSP
5.0000 mL | Freq: Four times a day (QID) | OROMUCOSAL | Status: DC
  Administered 2021-10-10 – 2021-10-27 (×66): 500000 [IU] via ORAL
  Filled 2021-10-10 (×65): qty 5

## 2021-10-10 MED ORDER — HYDROCOD POLST-CPM POLST ER 10-8 MG/5ML PO SUER: 5.0000 mL | Freq: Two times a day (BID) | ORAL | Status: DC | PRN

## 2021-10-10 MED ORDER — BETHANECHOL CHLORIDE 10 MG PO TABS
5.0000 mg | ORAL_TABLET | Freq: Four times a day (QID) | ORAL | Status: DC
Start: 2021-10-10 — End: 2021-10-17
  Administered 2021-10-10 – 2021-10-17 (×27): 5 mg
  Filled 2021-10-10 (×27): qty 1

## 2021-10-10 MED ORDER — ONDANSETRON HCL 4 MG/2ML IJ SOLN
4.0000 mg | Freq: Four times a day (QID) | INTRAMUSCULAR | Status: DC | PRN
  Administered 2021-10-11: 4 mg via INTRAVENOUS
  Filled 2021-10-10: qty 2

## 2021-10-10 MED ORDER — ENOXAPARIN SODIUM 40 MG/0.4ML IJ SOSY
40.0000 mg | PREFILLED_SYRINGE | INTRAMUSCULAR | Status: DC
  Administered 2021-10-10 – 2021-11-03 (×25): 40 mg via SUBCUTANEOUS
  Filled 2021-10-10 (×25): qty 0.4

## 2021-10-10 MED ORDER — DICLOFENAC SODIUM 1 % EX GEL
2.0000 g | Freq: Four times a day (QID) | CUTANEOUS | Status: DC
  Administered 2021-10-10 – 2021-10-27 (×59): 2 g via TOPICAL
  Filled 2021-10-10: qty 100

## 2021-10-10 MED ORDER — ENOXAPARIN SODIUM 40 MG/0.4ML IJ SOSY: 40.0000 mg | PREFILLED_SYRINGE | INTRAMUSCULAR | Status: DC

## 2021-10-10 MED ORDER — TAMSULOSIN HCL 0.4 MG PO CAPS: 0.4000 mg | ORAL_CAPSULE | Freq: Every day | ORAL | Status: DC

## 2021-10-10 MED ORDER — SACCHAROMYCES BOULARDII 250 MG PO CAPS
250.0000 mg | ORAL_CAPSULE | Freq: Two times a day (BID) | ORAL | Status: DC
  Administered 2021-10-10 – 2021-11-01 (×45): 250 mg
  Filled 2021-10-10 (×45): qty 1

## 2021-10-10 MED ORDER — GUAIFENESIN 100 MG/5ML PO LIQD: 5.0000 mL | ORAL | Status: DC | PRN

## 2021-10-10 MED ORDER — ASPIRIN 81 MG PO CHEW
81.0000 mg | CHEWABLE_TABLET | Freq: Every morning | ORAL | Status: DC
  Administered 2021-10-11 – 2021-10-31 (×21): 81 mg
  Filled 2021-10-10 (×21): qty 1

## 2021-10-10 MED ORDER — JUVEN PO PACK
1.0000 | PACK | Freq: Two times a day (BID) | ORAL | Status: DC
  Administered 2021-10-10 – 2021-11-01 (×44): 1
  Filled 2021-10-10 (×41): qty 1

## 2021-10-10 MED ORDER — GERHARDT'S BUTT CREAM
TOPICAL_CREAM | Freq: Two times a day (BID) | CUTANEOUS | Status: DC
  Administered 2021-10-12 – 2021-10-28 (×2): 1 via TOPICAL
  Filled 2021-10-10 (×2): qty 1

## 2021-10-10 MED ORDER — HYDROCORTISONE ACETATE 25 MG RE SUPP
25.0000 mg | Freq: Two times a day (BID) | RECTAL | Status: DC
  Administered 2021-10-10 – 2021-10-28 (×19): 25 mg via RECTAL
  Filled 2021-10-10 (×36): qty 1

## 2021-10-10 MED ORDER — IPRATROPIUM BROMIDE 0.02 % IN SOLN
0.5000 mg | Freq: Four times a day (QID) | RESPIRATORY_TRACT | Status: DC | PRN
  Filled 2021-10-10: qty 2.5

## 2021-10-10 MED ORDER — ALBUTEROL SULFATE (2.5 MG/3ML) 0.083% IN NEBU: 2.5000 mg | INHALATION_SOLUTION | RESPIRATORY_TRACT | Status: DC | PRN

## 2021-10-10 MED ORDER — ONDANSETRON HCL 4 MG PO TABS
4.0000 mg | ORAL_TABLET | Freq: Four times a day (QID) | ORAL | Status: DC | PRN
  Administered 2021-10-12 – 2021-10-17 (×2): 4 mg via ORAL
  Filled 2021-10-10 (×4): qty 1

## 2021-10-10 MED ORDER — ALBUTEROL SULFATE (5 MG/ML) 0.5% IN NEBU: 2.5000 mg | INHALATION_SOLUTION | RESPIRATORY_TRACT | Status: DC | PRN

## 2021-10-10 MED ORDER — LIDOCAINE 5 % EX PTCH
1.0000 | MEDICATED_PATCH | CUTANEOUS | Status: DC
  Administered 2021-10-11 – 2021-11-02 (×23): 1 via TRANSDERMAL
  Filled 2021-10-10 (×24): qty 1

## 2021-10-10 MED ORDER — FREE WATER
150.0000 mL | Status: DC
  Administered 2021-10-10 – 2021-11-04 (×128): 150 mL

## 2021-10-10 MED ORDER — INSULIN ASPART 100 UNIT/ML IJ SOLN
0.0000 [IU] | INTRAMUSCULAR | Status: DC
  Administered 2021-10-10 – 2021-10-13 (×11): 1 [IU] via SUBCUTANEOUS
  Administered 2021-10-13: 2 [IU] via SUBCUTANEOUS
  Administered 2021-10-14 – 2021-10-19 (×18): 1 [IU] via SUBCUTANEOUS
  Administered 2021-10-20: 2 [IU] via SUBCUTANEOUS
  Administered 2021-10-20 – 2021-10-21 (×3): 1 [IU] via SUBCUTANEOUS
  Administered 2021-10-21: 5 [IU] via SUBCUTANEOUS
  Administered 2021-10-21: 2 [IU] via SUBCUTANEOUS
  Administered 2021-10-21: 1 [IU] via SUBCUTANEOUS
  Administered 2021-10-22: 17:00:00 2 [IU] via SUBCUTANEOUS
  Administered 2021-10-22 – 2021-10-26 (×12): 1 [IU] via SUBCUTANEOUS

## 2021-10-10 MED ORDER — PANTOPRAZOLE 2 MG/ML SUSPENSION
40.0000 mg | Freq: Every day | ORAL | Status: DC
  Administered 2021-10-11 – 2021-10-30 (×20): 40 mg
  Filled 2021-10-10 (×20): qty 20

## 2021-10-10 MED ORDER — OXYCODONE HCL 5 MG PO TABS
5.0000 mg | ORAL_TABLET | Freq: Four times a day (QID) | ORAL | Status: DC | PRN
  Administered 2021-10-11 – 2021-10-18 (×12): 5 mg
  Filled 2021-10-10 (×13): qty 1

## 2021-10-10 MED ORDER — SIMVASTATIN 20 MG PO TABS
40.0000 mg | ORAL_TABLET | Freq: Every evening | ORAL | Status: DC
  Administered 2021-10-10 – 2021-11-01 (×23): 40 mg
  Filled 2021-10-10 (×23): qty 2

## 2021-10-10 MED ORDER — SIMVASTATIN 40 MG PO TABS: 40.0000 mg | ORAL_TABLET | Freq: Every evening | ORAL | 11 refills | Status: DC

## 2021-10-10 MED ORDER — SACCHAROMYCES BOULARDII 250 MG PO CAPS: 250.0000 mg | ORAL_CAPSULE | Freq: Two times a day (BID) | ORAL | Status: DC

## 2021-10-10 MED ORDER — SIMVASTATIN 20 MG PO TABS: 40.0000 mg | ORAL_TABLET | Freq: Every evening | ORAL | Status: DC

## 2021-10-10 MED ORDER — JEVITY 1.2 CAL PO LIQD
1000.0000 mL | ORAL | Status: DC
  Administered 2021-10-10 – 2021-10-12 (×3): 1000 mL
  Filled 2021-10-10 (×5): qty 1000

## 2021-10-10 MED ORDER — ACETAMINOPHEN 160 MG/5ML PO SOLN
650.0000 mg | Freq: Four times a day (QID) | ORAL | Status: DC | PRN
  Administered 2021-10-10 – 2021-10-29 (×11): 650 mg
  Filled 2021-10-10 (×11): qty 20.3

## 2021-10-10 MED ORDER — FLUCONAZOLE 100 MG PO TABS
100.0000 mg | ORAL_TABLET | Freq: Every day | ORAL | Status: DC
  Administered 2021-10-10 – 2021-10-28 (×19): 100 mg
  Filled 2021-10-10 (×18): qty 1

## 2021-10-10 MED ORDER — FLUCONAZOLE 100 MG PO TABS
200.0000 mg | ORAL_TABLET | ORAL | Status: AC
  Administered 2021-10-10: 200 mg
  Filled 2021-10-10: qty 2

## 2021-10-10 MED ORDER — LOPERAMIDE HCL 2 MG PO CAPS: 2.0000 mg | ORAL_CAPSULE | Freq: Four times a day (QID) | ORAL | Status: DC | PRN

## 2021-10-10 MED ORDER — PROSOURCE TF PO LIQD
45.0000 mL | Freq: Three times a day (TID) | ORAL | Status: DC
  Administered 2021-10-10 – 2021-10-13 (×9): 45 mL
  Filled 2021-10-10 (×9): qty 45

## 2021-10-10 NOTE — Progress Notes (Signed)
Patient seen and examined at the bedside this morning.  Hemodynamically stable.  Remains comfortable.  Foley had to be inserted yesterday evening.  He is medically stable for discharge to CIR today.  Discharge summary and orders are in from yesterday.  No new change in the medical management

## 2021-10-10 NOTE — Progress Notes (Signed)
Report given to RN @ CIR.

## 2021-10-10 NOTE — Progress Notes (Signed)
Patient ID: Raymond Stuart, male   DOB: 05/09/1951, 69 y.o.   MRN: [id number] Met with the patient to introduce self and role. Reviewed rehab routine, team conference and plan of care. Discussed education on PEG care, foley care p previous voiding trial failure. Anticipate another trial p medication adjusted before discharge. Also reviewed skin care, Stage 2 and DTI with MASD; skin care orders placed. D2 thin diet; poor appetite. Jevity at 70cc/hr via PEG. Information left for PEG care, medications and tube feeding instructions but plan would be to work on debility and strengthening to return home with wife. Patient with flat affect, tired of being hospitalized. Noted he was enjoying retirement and was active PTA. Continue to follow along to discharge to address educational needs and collaborate with the team to facilitate smooth discharge home with wife. ,  B, RN  

## 2021-10-10 NOTE — Progress Notes (Signed)
Speech Language Pathology Treatment: Dysphagia  Patient Details Name: Raymond Stuart MRN: [id number] DOB: 07-08-1951 Today's Date: 10/10/2021 Time: 9702-6378 SLP Time Calculation (min) (ACUTE ONLY): 52 min  Assessment / Plan / Recommendation Clinical Impression  Pt seen today for focus on dysphagia goals, especially in light of dietary advancement yesterday.  He is leaving to CIR today!  Obtained computer to review MBS study completed on 11/4 and repeat on 11/14 - as well as pt verbalizing clinical reasoning for EMST and strengthening cough, "hock".  Pt verbalized with minimal delay but did not require cues!  Barium tablet lodged at vallecular space on both prior MBS studies without pt sensation - thus caution with solids/pills is indicated due to sensory deficits.  Wife reports pt with poor intake, pt admits to gravy on potatoes was too peppery.  Suspect his minimal posterior oral candidiasis may be negatively impacting gustation.  Also he is receiving full nutrition needs via PEG, has not been able to have po meals since early October, thus anticipate his po will increase with ongoing po.  SLP observed pt consuming chicken noodle soup and apple juice. NO indication of airway compromise but pt benefited from moderate cues to follow solid with liquids.  Spouse and pt report pt conducted his RMST over the weekend x5, encouraged to continue.  Recommend advance diet to Dys2/thin with precautions.  Of note, pt's wife reports pt would cough on pills since his surgery - ? since hemorrhage?  - Provided updated signs to pt, wife for use on CIR and for their education.  It has been a pleasure to help with Mr Larmer and his spouse during his hospital coarse. He has made signficant progress and anticipate he will continue.    HPI HPI: Raymond Stuart is a 70 y.o. male with medical history significant for GERD hyperlipidemia, osteoarthritis, urolithiasis who was recently admitted to [facility] (9/26-29) after falling, hitting his  head on concrete and sustaining a large right frontal intraparenchymal hemorrhage, large intraventricular hemorrhage, SDH and right occipital bone fx. PEG 10/27.  D/Cd home, 9/30 admitted to [facility] then D/Cd  to SNF for rehab on 11/1.  Pt was transferred from his facility after being there for less than 24 hours to [facility] after being found covered in vomit, febrile at 101.2 F,, tachypneic/dyspneic and hypoxic in the 80s.  Per Md notes, "he responded to NRB oxygen at 10 LPM then subsequently brought to the emergency department.  At the facility, he was started getting tube feeding around 2300 last night.  He has been bedbound.  A significant portion of the history is taken from his wife, but he was able to say a few things and answer simple questions."  Swallow eval ordered.  Pt has hx of dysphagia diagnosed initially 10/4 and mech soft diet ordered, mentation waxed/waned and pt was made npo with Dobhoff.  MBS then conducted 10/19 and 11/1 - recommendation was npo x frazier water protocol.  Large barrier to adequate po intake has been his mentation.    Recs from 11/1 MBS were npo and ice chips with aggressive SLP at next venue of care.  11/3 clinical swallow evaluation recommended NPO except ice chips and repeat MBS. Repeat MBS completed 11/4 with recommendation to continue NPO except for ice/water PRN and nectar thick liquid trials with SLP only.  Pt underwent repeat MBS on 11/14 and diet was advanced to dys1/thin.      SLP Plan  Other (Comment) (to follow up at CIR)  Recommendations for follow up therapy are one component of a multi-disciplinary discharge planning process, led by the attending physician.  Recommendations may be updated based on patient status, additional functional criteria and insurance authorization.    Recommendations  Diet recommendations: Dysphagia 2 (fine chop);Thin liquid Liquids provided via: Cup;Straw Medication Administration: Crushed with puree Supervision: Patient able to  self feed Postural Changes and/or Swallow Maneuvers: Seated upright 90 degrees;Upright 30-60 min after meal                Oral Care Recommendations: Oral care QID;Oral care prior to ice chip/H20;Staff/trained caregiver to provide oral care Follow Up Recommendations: Acute inpatient rehab (3hours/day) Assistance recommended at discharge: Frequent or constant Supervision/Assistance SLP Visit Diagnosis: Dysphagia, oropharyngeal phase (R13.12) Plan: Other (Comment) (to follow up at CIR)       GO               Rolena Infante, MS [facility] SLP Acute Rehab Services Office [phone] Pager [phone]  Chales Abrahams  10/10/2021, 12:10 PM

## 2021-10-10 NOTE — Progress Notes (Signed)
PMR Admission Coordinator Pre-Admission Assessment  Patient: Raymond Stuart is an 70 y.o., male MRN: [id number] DOB: 01/02/1951 Height: 6' (182.9 cm) Weight: 81.9 kg  Insurance Information HMO: yes    PPO:      PCP:      IPA:      80/20:      OTHER:  PRIMARY: [employer] Medicare      Policy#: [id number]      Subscriber: patient CM Name: Elizabeth      Phone#: [phone]     Fax#: [phone] Pre-Cert#: [id number] auth for CIR provided by Elizabeth after expedited appeal with health plan.  Updates due to fax listed above on 11/18.      Employer:  Benefits:  Phone #: [email or web address]     Name:  Eff. Date: 11/26/20-11/25/21  Deduct: $0 (does not have deductible)    Out of Pocket Max: $4,500 ($2,186.94 met) Life Max: NA CIR: $325/day co-pay for days 1-5, 100% coverage days 6+      SNF: 100% coverage days 1-20, $188/day co-pay days 21-44, 100% coverage Outpatient: $30/visit co-pay     Co-Pay:  Home Health: 100% coverage      Co-Pay:  DME: 80% coverage     Co-Pay: 20% co-insurance Providers: in-network SECONDARY:       Policy#:      Phone#:   Financial Counselor:       Phone#:   The "Data Collection Information Summary" for patients in Inpatient Rehabilitation Facilities with attached "Privacy Act Statement-Health Care Records" was provided and verbally reviewed with: Family  Emergency Contact Information Contact Information     Name Relation Home Work Mobile   Parthasarathy,Nancy Spouse   [phone]       Current Medical History  Patient Admitting Diagnosis: debility d/t sepsis secondary to aspiration PNA with UTI History of Present Illness: Pt is a 70 y/o male admitted to [facility] on 11/2 with AMS and SOB.  Admitted from SNF where he was receiving therapy services for SDH, traumatic IPH, ventricular hemorrhage in the setting of acute head injury with occipital fracture, and ischemic stroke.  Pt with PNA 2/2 dysphagia from stroke.  PEG placed at OSH.  In ED pt febrile at 101.2,  tachypneic/dyspneic and hypoxic in the 80s.  Placed on NRB at 10L.  Pulse 120.  BP 122/85.  Hospital course management of acute hypoxic respiratory failure 2/2 aspiration pneumonia and klebsiella/pseudomonas UTI.  He completed abx course.  Therapy evaluations were completed and pt was recommended for CIR.   .  PMH significant for OA, urolithiasis.      Patient's medical record from [facility] has been reviewed by the rehabilitation admission coordinator and physician.  Past Medical History  Past Medical History:  Diagnosis Date   GERD (gastroesophageal reflux disease)    Hyperlipidemia    Joint pain    Kidney stone     Has the patient had major surgery during 100 days prior to admission? No  Family History   family history includes Hypertension in his mother.  Current Medications  Current Facility-Administered Medications:    acetaminophen (TYLENOL) 160 MG/5ML solution 650 mg, 650 mg, Per Tube, Q6H PRN, Sheikh, Omair Latif, DO, 650 mg at 10/09/21 1400   alum & mag hydroxide-simeth (MAALOX/MYLANTA) 200-200-20 MG/5ML suspension 15 mL, 15 mL, Oral, Once, Kakrakandy, Arshad N, MD   aspirin chewable tablet 81 mg, 81 mg, Per Tube, q AM, Sheikh, Omair Latif, DO, 81 mg at 10/09/21 1051   Chlorhexidine Gluconate Cloth 2 %   PADS 6 each, 6 each, Topical, Daily, Ortiz, David Manuel, MD, 6 each at 10/09/21 1054   chlorpheniramine-HYDROcodone (TUSSIONEX) 10-8 MG/5ML suspension 5 mL, 5 mL, Per Tube, Q12H PRN, Sheikh, Omair Latif, DO, 5 mL at 09/30/21 2011   diclofenac Sodium (VOLTAREN) 1 % topical gel 2 g, 2 g, Topical, QID, Sheikh, Omair Latif, DO, 2 g at 10/09/21 1401   enoxaparin (LOVENOX) injection 40 mg, 40 mg, Subcutaneous, Q24H, Xu, Fang, MD, 40 mg at 10/08/21 1754   feeding supplement (JEVITY 1.2 CAL) liquid 1,000 mL, 1,000 mL, Per Tube, Continuous, Xu, Fang, MD, Last Rate: 70 mL/hr at 10/09/21 0612, 1,000 mL at 10/09/21 0612   feeding supplement (PROSource TF) liquid 45 mL, 45 mL,  Per Tube, TID, Xu, Fang, MD, 45 mL at 10/09/21 1620   free water 150 mL, 150 mL, Per Tube, Q4H, Sheikh, Omair Latif, DO, 150 mL at 10/09/21 1620   Gerhardt's butt cream, , Topical, BID, Sheikh, Omair Latif, DO, Given at 10/09/21 1053   guaiFENesin (ROBITUSSIN) 100 MG/5ML liquid 5 mL, 5 mL, Per Tube, Q4H PRN, Sheikh, Omair Latif, DO, 5 mL at 09/29/21 1833   hydrocortisone (ANUSOL-HC) suppository 25 mg, 25 mg, Rectal, BID, Xu, Fang, MD, 25 mg at 10/09/21 1052   insulin aspart (novoLOG) injection 0-9 Units, 0-9 Units, Subcutaneous, Q4H, Sheikh, Omair Latif, DO, 1 Units at 10/09/21 1620   ipratropium (ATROVENT) nebulizer solution 0.5 mg, 0.5 mg, Nebulization, Q6H PRN, Sheikh, Omair Latif, DO   levalbuterol (XOPENEX) nebulizer solution 0.63 mg, 0.63 mg, Nebulization, Q6H PRN, Sheikh, Omair Latif, DO   lidocaine (LIDODERM) 5 % 1 patch, 1 patch, Transdermal, Q24H, Xu, Fang, MD, 1 patch at 10/09/21 1054   loperamide (IMODIUM) capsule 2 mg, 2 mg, Oral, Q6H PRN, Xu, Fang, MD, 2 mg at 10/08/21 1756   MEDLINE mouth rinse, 15 mL, Mouth Rinse, BID, Ortiz, David Manuel, MD, 15 mL at 10/09/21 1055   nutrition supplement (JUVEN) (JUVEN) powder packet 1 packet, 1 packet, Per Tube, BID BM, Xu, Fang, MD, 1 packet at 10/09/21 1400   nystatin (MYCOSTATIN) 100000 UNIT/ML suspension 500,000 Units, 5 mL, Oral, QID, Xu, Fang, MD, 500,000 Units at 10/09/21 1400   ondansetron (ZOFRAN) tablet 4 mg, 4 mg, Oral, Q6H PRN **OR** ondansetron (ZOFRAN) injection 4 mg, 4 mg, Intravenous, Q6H PRN, Ortiz, David Manuel, MD, 4 mg at 10/06/21 1306   oxyCODONE (Oxy IR/ROXICODONE) immediate release tablet 5 mg, 5 mg, Per Tube, Q6H PRN, Sheikh, Omair Latif, DO, 5 mg at 10/09/21 1140   pantoprazole sodium (PROTONIX) 40 mg/20 mL oral suspension 40 mg, 40 mg, Per Tube, Daily, Legge, Justin M, RPH, 40 mg at 10/09/21 1140   saccharomyces boulardii (FLORASTOR) capsule 250 mg, 250 mg, Oral, BID, Xu, Fang, MD, 250 mg at 10/09/21 1050   simvastatin  (ZOCOR) tablet 40 mg, 40 mg, Per Tube, QPM, Sheikh, Omair Latif, DO, 40 mg at 10/08/21 1756   tamsulosin (FLOMAX) capsule 0.4 mg, 0.4 mg, Oral, QPC supper, Xu, Fang, MD, 0.4 mg at 10/08/21 1756  Patients Current Diet:  Diet Order             DIET - DYS 1 Room service appropriate? Yes; Fluid consistency: Thin  Diet effective now           Diet general                   Precautions / Restrictions Precautions Precautions: Fall Precaution Comments: g-tube, orthostatic, left hip pain Restrictions Weight Bearing Restrictions:   No   Has the patient had 2 or more falls or a fall with injury in the past year? Yes  Prior Activity Level Community (5-7x/wk): drives, gets out of house daily  Prior Functional Level Self Care: Did the patient need help bathing, dressing, using the toilet or eating? Independent  Indoor Mobility: Did the patient need assistance with walking from room to room (with or without device)? Independent  Stairs: Did the patient need assistance with internal or external stairs (with or without device)? Independent  Functional Cognition: Did the patient need help planning regular tasks such as shopping or remembering to take medications? Independent  Patient Information Are you of Hispanic, Latino/a,or Spanish origin?: X. Patient unable to respond, A. No, not of Hispanic, Latino/a, or Spanish origin What is your race?: X. Patient unable to respond, A. White Do you need or want an interpreter to communicate with a doctor or health care staff?: 9. Unable to respond  Patient's Response To:  Health Literacy and Transportation Is the patient able to respond to health literacy and transportation needs?: No Health Literacy - How often do you need to have someone help you when you read instructions, pamphlets, or other written material from your doctor or pharmacy?: Patient unable to respond  Home Assistive Devices / Equipment Home Assistive Devices/Equipment: Blood  pressure cuff, Grab bars around toilet, Grab bars in shower, Hand-held shower hose, Walker (specify type), Wheelchair, Feeding equipment, Scales, Hospital bed, Other (Comment) (foley catheter, peg tube) Home Equipment: Shower seat, Rolling Walker (2 wheels)  Prior Device Use: Indicate devices/aids used by the patient prior to current illness, exacerbation or injury? None of the above  Current Functional Level Cognition  Overall Cognitive Status: Within Functional Limits for tasks assessed Current Attention Level: Sustained Orientation Level: Oriented X4 Following Commands: Follows one step commands with increased time, Follows multi-step commands inconsistently Safety/Judgement: Decreased awareness of safety General Comments: wife was present, pt continues with flat affect and slightly anxious about OOB mobility, pt reassured.    Extremity Assessment (includes Sensation/Coordination)  Upper Extremity Assessment: Defer to OT evaluation RUE Deficits / Details: MMT 3+/5, biceps and triceps 4/5, gross grip 4+/5 RUE Sensation: WNL RUE Coordination: decreased fine motor LUE Deficits / Details: MMT 2/5, biceps and triceps 4/5, gross grip 3+/5 LUE: Shoulder pain with ROM LUE Sensation: WNL LUE Coordination: decreased fine motor  Lower Extremity Assessment: RLE deficits/detail, LLE deficits/detail RLE Deficits / Details: knee ext 4/5, reports B feet feel "a little numb" RLE Sensation: decreased light touch RLE Coordination: WNL LLE Deficits / Details: knee ext +2/5 limited by pain LLE Sensation: decreased light touch    ADLs  Overall ADL's : Needs assistance/impaired Eating/Feeding: Sitting, Set up Grooming: Moderate assistance, Sitting Upper Body Bathing: Minimal assistance, Sitting Lower Body Bathing: Maximal assistance, Sitting/lateral leans, +2 for physical assistance Upper Body Dressing : Moderate assistance, Sitting Lower Body Dressing: Maximal assistance, +2 for physical  assistance, Sit to/from stand Toilet Transfer Details (indicate cue type and reason): toileting bed level at this time Toileting- Clothing Manipulation and Hygiene: Maximal assistance, Bed level Functional mobility during ADLs: +2 for safety/equipment, +2 for physical assistance, Maximal assistance General ADL Comments: attempted to participate in sitting on edge of bed for grooming tasks with patient reporting need to have BM. patient was mod A to roll to each side in bed to complete hygiene tasks. patient upon second attempt to sit on edge of bed reported he needed to void again. patient was noted to have had   another loose BM. session was ended with nurse in room to assist with hygiene tasks.patients wife inquired about CIR v.s. home with HH. patients wife was educated on differences of each. patients wife reported she still wants CIR for sure at this time.    Mobility  Overal bed mobility: Needs Assistance Bed Mobility: Rolling, Sidelying to Sit Rolling: Min assist Sidelying to sit: Mod assist Supine to sit: Max assist, +2 for safety/equipment, HOB elevated Sit to sidelying: Max assist General bed mobility comments: required physical assist to bring LEs off side of bed and power up into sitting.    Transfers  Overall transfer level: Needs assistance Transfers: Sit to/from Stand, Bed to chair/wheelchair/BSC Sit to Stand: +2 physical assistance, +2 safety/equipment, From elevated surface, Mod assist Stand pivot transfers: Total assist, +2 physical assistance Transfer via Lift Equipment: Stedy General transfer comment: +2 mod A (pt 60%) to rise from elevated bed, pt actively assisted with  pulling up on bar on Stedy; pt stood with active weight bearing through BLEs (~30 seconds+ 10 seconds + 10 seconds) x 3 trials then fatigued and sat on flaps of Stedy.    Ambulation / Gait / Stairs / Wheelchair Mobility  Ambulation/Gait General Gait Details: unable    Posture / Balance Dynamic Sitting  Balance Sitting balance - Comments: BUE support to maintain sitting balance. Sat EOB for ~8 minutes. Mild posterior lean intially, then fair. Balance Overall balance assessment: Needs assistance Sitting-balance support: No upper extremity supported, Feet supported Sitting balance-Leahy Scale: Fair Sitting balance - Comments: BUE support to maintain sitting balance. Sat EOB for ~8 minutes. Mild posterior lean intially, then fair. Postural control: Right lateral lean    Special needs/care consideration Diabetic management yes   Previous Home Environment (from acute therapy documentation) Living Arrangements: Spouse/significant other, Children  Lives With: Spouse, Daughter Available Help at Discharge: Family, Available 24 hours/day Type of Home: House Home Layout: One level Home Access: Level entry Bathroom Shower/Tub: Walk-in shower Bathroom Toilet: Standard Bathroom Accessibility: Yes How Accessible: Accessible via walker Home Care Services: No Additional Comments: lived with wife before accident and stroke. discharged to SNF for one day before admittance to [facility]. At baseline was independent with mobility.  Discharge Living Setting Plans for Discharge Living Setting: Patient's home Type of Home at Discharge: House Discharge Home Layout: One level Discharge Home Access: Level entry Discharge Bathroom Shower/Tub: Walk-in shower Discharge Bathroom Toilet: Standard Discharge Bathroom Accessibility: Yes How Accessible: Accessible via walker Does the patient have any problems obtaining your medications?: No  Social/Family/Support Systems Patient Roles: Spouse, Parent Anticipated Caregiver: Nancy, wife Anticipated Caregiver's Contact Information: [phone] Caregiver Availability: 24/7 Discharge Plan Discussed with Primary Caregiver: Yes Is Caregiver In Agreement with Plan?: Yes Does Caregiver/Family have Issues with Lodging/Transportation while Pt is in Rehab?:  No  Goals Patient/Family Goal for Rehab: Supervision:PT/OT, Min A:ST Expected length of stay: 24-26 days Pt/Family Agrees to Admission and willing to participate: Yes Program Orientation Provided & Reviewed with Pt/Caregiver Including Roles  & Responsibilities: Yes  Decrease burden of Care through IP rehab admission: NA  Possible need for SNF placement upon discharge: Not anticipated  Patient Condition: I have reviewed medical records from [facility], spoken with CM, and spouse. I discussed via phone for inpatient rehabilitation assessment.  Patient will benefit from ongoing PT, OT, and SLP, can actively participate in 3 hours of therapy a day 5 days of the week, and can make measurable gains during the admission.  Patient will also benefit from the   coordinated team approach during an Inpatient Acute Rehabilitation admission.  The patient will receive intensive therapy as well as Rehabilitation physician, nursing, social worker, and care management interventions.  Due to bladder management, bowel management, safety, skin/wound care, disease management, medication administration, pain management, and patient education the patient requires 24 hour a day rehabilitation nursing.  The patient is currently min to mod assist +2 with mobility and basic ADLs.  Discharge setting and therapy post discharge at home with home health is anticipated.  Patient has agreed to participate in the Acute Inpatient Rehabilitation Program and will admit today.  Preadmission Screen Completed By:  Caitlin E Warren, 10/09/2021 4:31 PM ______________________________________________________________________   Discussed status with Dr. Lovorn  on 10/10/21 at 0930 and received approval for admission today.  Admission Coordinator:  Caitlin E Warren, PT, time 1030/Date 10/10/21 Assessment/Plan: Diagnosis: Does the need for close, 24 hr/day Medical supervision in concert with the patient's rehab needs make it unreasonable for  this patient to be served in a less intensive setting? Yes Co-Morbidities requiring supervision/potential complications: pneumonia due to stroke/aspiration- SOB; recent SDH/IPH/IVH due to occipital fx/ischemic stroke Due to bladder management, bowel management, safety, skin/wound care, disease management, medication administration, pain management, and patient education, does the patient require 24 hr/day rehab nursing? Yes Does the patient require coordinated care of a physician, rehab nurse, PT, OT, and SLP to address physical and functional deficits in the context of the above medical diagnosis(es)? Yes Addressing deficits in the following areas: balance, endurance, locomotion, strength, transferring, bowel/bladder control, bathing, dressing, feeding, grooming, toileting, cognition, speech, language, and swallowing Can the patient actively participate in an intensive therapy program of at least 3 hrs of therapy 5 days a week? Yes The potential for patient to make measurable gains while on inpatient rehab is good and fair Anticipated functional outcomes upon discharge from inpatient rehab: min assist PT, min assist OT, min assist SLP Estimated rehab length of stay to reach the above functional goals is: 24-26 days Anticipated discharge destination: Home 10. Overall Rehab/Functional Prognosis: good and fair   MD Signature:  

## 2021-10-10 NOTE — Progress Notes (Signed)
Patient arrived via stretcher from [facility] at approximately 1400. Patient is A&O x 4 and able to make his needs known. Patient wife and daughter at bedside. Patient skin assessed with 2nd RN Gavin Pound. Skin CDI MADS on buttock. Barrier applied for protection. Foley patent draining yellow clear urine. Peg tube patent, no residual noted. Jevity 1.2 at 70 ml/hr continuous and 150 ml water flush Q 4 hours. BS continues Q 4 last BS 101. Bilateral lung sounds clear, ABD soft, non distended. Patients documentation of [facility] states last BM was 11/13, however wife states that patient had a large BM yesterday while in therapy. Skin warm to touch, positive pedal pulses, cap refill less than 3 seconds, patient able to wiggle toes.  Family aware of article policy. Call light and personal items within reach. Safety maintained.

## 2021-10-10 NOTE — Progress Notes (Addendum)
Inpatient Rehabilitation Admission Medication Review by a Pharmacist  A complete drug regimen review was completed for this patient to identify any potential clinically significant medication issues.  High Risk Drug Classes Is patient taking? Indication by Medication  Antipsychotic No   Anticoagulant Yes LMWH for VTE prophx.  Antibiotic No   Opioid Yes Oxycodone for pain (hip, knee, pressure ulcers)   Antiplatelet Yes ASA s/p CVA  Hypoglycemics/insulin Yes SSI for DM  Vasoactive Medication No   Chemotherapy No   Other No      Type of Medication Issue Identified Description of Issue Recommendation(s)  Drug Interaction(s) (clinically significant)     Duplicate Therapy     Allergy     No Medication Administration End Date     Incorrect Dose     Additional Drug Therapy Needed  Amantadine, MV, Sucralfate, Vit D Resume at discharge  Significant med changes from prior encounter (inform family/care partners about these prior to discharge).    Other  Flomax, despite being ordered PTA should not be placed down a tube.  Checking with Dr. Renford Dills about whether to keep vs d/c the Zocor Educate PA and RN about Flomax and change to alternative if needed.  F/u med message to Dr. Renford Dills    Clinically significant medication issues were identified that warrant physician communication and completion of prescribed/recommended actions by midnight of the next day:  Yes  Name of provider notified for urgent issues identified:  Arthor Captain PA and Dr. Renford Dills  Provider Method of Notification: chat  Pharmacist comments:   Time spent performing this drug regimen review (minutes):    Aziel Morgan S. Merilynn Finland, PharmD, BCPS Clinical Staff Pharmacist [email or web address] Pasty Spillers 10/10/2021 2:23 PM   Adden: Upon discussion with Dr. Berline Chough, d/c Flomax and change to Bethanechol. Dr. Renford Dills said the discontinuatiom of Zocor may have been an error, Dr. Renford Dills and Dr.  Berline Chough ok to con't.   Marchel Foote S. Merilynn Finland, PharmD, BCPS Clinical Staff Pharmacist [email or web address]

## 2021-10-10 NOTE — Progress Notes (Signed)
Inpatient Rehab Admissions Coordinator:  ° °I have a bed for this pt. On CIR today. RN may call report to [phone]. ° °Jeric Slagel, MS, CCC-SLP °Rehab Admissions Coordinator  °[phone] (celll) °[phone] (office) ° °

## 2021-10-10 NOTE — H&P (Signed)
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 Pharyngeal -- Pharyngeal- Thin Cup Reduced airway/laryngeal closure;Delayed swallow initiation-vallecula;Pharyngeal residue - valleculae;Pharyngeal residue - pyriform;Penetration/Aspiration during swallow Pharyngeal Material enters airway, remains ABOVE  vocal cords then ejected out Pharyngeal- Thin Straw Delayed swallow initiation-vallecula;Reduced airway/laryngeal closure;Penetration/Aspiration during swallow;Pharyngeal residue - pyriform;Pharyngeal residue - valleculae Pharyngeal Material enters airway, remains ABOVE vocal cords then ejected out Pharyngeal- Puree Pharyngeal residue - valleculae;Pharyngeal residue - pyriform Pharyngeal -- Pharyngeal- Mechanical Soft -- Pharyngeal -- Pharyngeal- Regular Pharyngeal residue - valleculae;Pharyngeal residue - pyriform Pharyngeal -- Pharyngeal- Multi-consistency -- Pharyngeal -- Pharyngeal- Pill Delayed swallow initiation-vallecula;Pharyngeal residue - valleculae;Reduced epiglottic inversion Pharyngeal -- Pharyngeal Comment --  Cervical Esophageal Phase   10/09/2021 Cervical Esophageal Phase WFL Pudding Teaspoon -- Pudding Cup -- Honey Teaspoon -- Honey Cup -- Nectar Teaspoon -- Nectar Cup -- Nectar Straw -- Thin Teaspoon -- Thin Cup -- Thin Straw -- Puree -- Mechanical Soft -- Regular -- Multi-consistency -- Pill -- Cervical Esophageal Comment -- Sonia Baller, MA, CCC-SLP Speech Therapy                         Medical Problem List and Plan: 1.  Debility secondary to sepsis/multi medical with history of traumatic subdural hematoma, traumatic intraparenchymal hemorrhage/occipital fracture, aspiration pneumonia, ischemic CVA  -patient may  shower  -ELOS/Goals: 10-14 days- supervision 2.  Antithrombotics: -DVT/anticoagulation:  Pharmaceutical: Lovenox check vascular study  -antiplatelet therapy: Aspirin 81 mg daily 3. Pain Management:Voltaren gel, Lidoderm patch as directed, oxycodone as needed 4. Mood: Provide emotional support  -antipsychotic agents: N/A 5. Neuropsych: This patient is not capable of making decisions on his own behalf. 6. Skin/Wound Care: Routine skin checks 7. Fluids/Electrolytes/Nutrition: Routine in and outs with follow-up chemistries 8.  Dysphagia/gastrostomy tube.  [facility]astrost[facility] tube [facility] 10/27 [facility] dysphagia #2 diet with thins, but meds crushed in puree- cannot take Flomax!.  Follow-up speech therapy.   9.  AKI.  Follow-up chemistries 10.  Acute urinary retention/Pseudomonas Klebsiella UTI.  Foley catheter tube reinserted after failed voiding trial.  Maintain on Flomax and will again attempt voiding trial while on rehab.  Completed course of IV Zosyn 11.  Hyperlipidemia.  Zocor 12. Thrush- will start Diflucan 200 mg x1 and 100 mg daily x 6 days and con't nystatin 13. Diarrhea- likely due to TF's- might need more fiber 14. Urinary retention- has Foley- suggest removing in AM (hx of penile trauma due to cathing at [facility])- pt/wife amenable ot foley being removed in AM- stop Flomax  since cannot be crushed/put in PEG- start Bethanachol 5 mg QID-    I have personally performed a face to face diagnostic evaluation of this patient and formulated the key components of the plan.  Additionally, I have personally reviewed laboratory data, imaging studies, as well as relevant notes and concur with the physician assistant's documentation above.   The patient's status has not changed from the original H&P.  Any changes in documentation from the acute care chart have been noted above.     Lavon Paganini Angiulli, PA-C 10/10/2021

## 2021-10-10 NOTE — Progress Notes (Signed)
Patient arrived from [facility] ped tube in place. Peg patent, bowel sounds x4.

## 2021-10-10 NOTE — Discharge Instructions (Addendum)
Inpatient Rehab Discharge Instructions  Raymond Stuart Discharge date and time: No discharge date for patient encounter.   Activities/Precautions/ Functional Status: Activity: activity as tolerated Diet:  Wound Care: Routine skin checks Functional status:  ___ No restrictions     ___ Walk up steps independently ___ 24/7 supervision/assistance   ___ Walk up steps with assistance ___ Intermittent supervision/assistance  ___ Bathe/dress independently ___ Walk with walker     __x_ Bathe/dress with assistance ___ Walk Independently    ___ Shower independently ___ Walk with assistance    ___ Shower with assistance ___ No alcohol     ___ Return to work/school ________  COMMUNITY REFERRALS UPON DISCHARGE:    Home Health:   PT     OT                    Agency: [facility]  Phone:  [phone]   Medical Equipment/Items Ordered: Wheelchair, Bedside commode, Advertising copywriter                                                  Agency/Supplier: [employer]    Special Instructions: No Driving Smoking or Alcohol  FOLLOW UP [facility] FOR PEG TUBE REMOVAL   My questions have been answered and I understand these instructions. I will adhere to these goals and the provided educational materials after my discharge from the hospital.  Patient/Caregiver Signature _______________________________ Date __________  Clinician Signature _______________________________________ Date __________  Please bring this form and your medication list with you to all your follow-up doctor's appointments.

## 2021-10-10 NOTE — Progress Notes (Signed)
Patient arrived from [place]  Peg in place

## 2021-10-10 NOTE — H&P (Signed)
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 residue - valleculae;Pharyngeal residue - pyriform Pharyngeal -- Pharyngeal- Nectar Teaspoon -- Pharyngeal -- Pharyngeal- Nectar Cup Delayed swallow initiation-vallecula Pharyngeal Material does not enter airway Pharyngeal- Nectar Straw NT Pharyngeal -- Pharyngeal- Thin Teaspoon -- Pharyngeal -- Pharyngeal- Thin Cup Reduced airway/laryngeal closure;Delayed swallow initiation-vallecula;Pharyngeal residue - valleculae;Pharyngeal residue - pyriform;Penetration/Aspiration during swallow Pharyngeal Material enters airway, remains ABOVE vocal cords then ejected out Pharyngeal- Thin Straw Delayed swallow initiation-vallecula;Reduced airway/laryngeal closure;Penetration/Aspiration during swallow;Pharyngeal residue - pyriform;Pharyngeal residue - valleculae Pharyngeal Material enters airway, remains ABOVE vocal cords then ejected out Pharyngeal- Puree Pharyngeal residue - valleculae;Pharyngeal residue - pyriform Pharyngeal -- Pharyngeal- Mechanical Soft -- Pharyngeal -- Pharyngeal- Regular Pharyngeal residue -  valleculae;Pharyngeal residue - pyriform Pharyngeal -- Pharyngeal- Multi-consistency -- Pharyngeal -- Pharyngeal- Pill Delayed swallow initiation-vallecula;Pharyngeal residue - valleculae;Reduced epiglottic inversion Pharyngeal -- Pharyngeal Comment --  Cervical Esophageal Phase  10/09/2021 Cervical Esophageal Phase WFL Pudding Teaspoon -- Pudding Cup -- Honey Teaspoon -- Honey Cup -- Nectar Teaspoon -- Nectar Cup -- Nectar Straw -- Thin Teaspoon -- Thin Cup -- Thin Straw -- Puree -- Mechanical Soft -- Regular -- Multi-consistency -- Pill -- Cervical Esophageal Comment -- Sonia Baller, MA, CCC-SLP Speech Therapy                               Medical Problem List and Plan: 1.  Debility secondary to sepsis/multi medical with history of traumatic subdural hematoma, traumatic intraparenchymal hemorrhage/occipital fracture, aspiration pneumonia, ischemic CVA             -patient may  shower             -ELOS/Goals: 10-14 days- supervision 2.  Antithrombotics: -DVT/anticoagulation:  Pharmaceutical: Lovenox check vascular study             -antiplatelet therapy: Aspirin 81 mg daily 3. Pain Management:Voltaren gel, Lidoderm patch as directed, oxycodone as needed 4. Mood: Provide emotional support             -antipsychotic agents: N/A 5. Neuropsych: This patient is not capable of making decisions on his own behalf. 6. Skin/Wound Care: Routine skin checks 7. Fluids/Electrolytes/Nutrition: Routine in and outs with follow-up chemistries 8.  Dysphagia/gastrostomy tube.  Gastrostomy tube placed 10/27 at [facility] dysphagia #2 diet with thins, but meds crushed in puree- cannot take Flomax!.  Follow-up speech therapy.   9.  AKI.  Follow-up chemistries 10.  Acute urinary retention/Pseudomonas Klebsiella UTI.  Foley catheter tube reinserted after failed voiding trial.  Maintain on Flomax and will again attempt  voiding trial while on rehab.  Completed course of IV Zosyn 11.  Hyperlipidemia.   Zocor 12. Thrush- will start Diflucan 200 mg x1 and 100 mg daily x 6 days and con't nystatin 13. Diarrhea- likely due to TF's- might need more fiber 14. Urinary retention- has Foley- suggest removing in AM (hx of penile trauma due to cathing at [facility] [place])- pt/wife amenable ot foley being removed in AM- stop Flomax since cannot be crushed/put in PEG- start Bethanachol 5 mg QID-      I have personally performed a face to face diagnostic evaluation of this patient and formulated the key components of the plan.  Additionally, I have personally reviewed laboratory data, imaging studies, as well as relevant notes and concur with the physician assistant's documentation above.   The patient's status has not changed from the original H&P.  Any changes in documentation from the acute care chart have been noted above.       Mcarthur Rossetti Angiulli, PA-C 10/10/2021

## 2021-10-10 NOTE — Progress Notes (Signed)
Patient arrived from [facility] with Peripheral IV, flushed without difficulty. Dressing CDI

## 2021-10-11 ENCOUNTER — Inpatient Hospital Stay (HOSPITAL_COMMUNITY): Payer: Medicare Other

## 2021-10-11 DIAGNOSIS — R609 Edema, unspecified: Secondary | ICD-10-CM

## 2021-10-11 LAB — CBC WITH DIFFERENTIAL/PLATELET
Abs Immature Granulocytes: 0.07 10*3/uL (ref 0.00–0.07)
Basophils Absolute: 0.1 10*3/uL (ref 0.0–0.1)
Basophils Relative: 1 %
Eosinophils Absolute: 0.2 10*3/uL (ref 0.0–0.5)
Eosinophils Relative: 2 %
HCT: 39.6 % (ref 39.0–52.0)
Hemoglobin: 12.4 g/dL — ABNORMAL LOW (ref 13.0–17.0)
Immature Granulocytes: 1 %
Lymphocytes Relative: 40 %
Lymphs Abs: 3.4 10*3/uL (ref 0.7–4.0)
MCH: 28 pg (ref 26.0–34.0)
MCHC: 31.3 g/dL (ref 30.0–36.0)
MCV: 89.4 fL (ref 80.0–100.0)
Monocytes Absolute: 1 10*3/uL (ref 0.1–1.0)
Monocytes Relative: 12 %
Neutro Abs: 3.9 10*3/uL (ref 1.7–7.7)
Neutrophils Relative %: 44 %
Platelets: 371 10*3/uL (ref 150–400)
RBC: 4.43 MIL/uL (ref 4.22–5.81)
RDW: 15.2 % (ref 11.5–15.5)
WBC: 8.7 10*3/uL (ref 4.0–10.5)
nRBC: 0 % (ref 0.0–0.2)

## 2021-10-11 LAB — COMPREHENSIVE METABOLIC PANEL
ALT: 34 U/L (ref 0–44)
AST: 24 U/L (ref 15–41)
Albumin: 2.6 g/dL — ABNORMAL LOW (ref 3.5–5.0)
Alkaline Phosphatase: 105 U/L (ref 38–126)
Anion gap: 9 (ref 5–15)
BUN: 25 mg/dL — ABNORMAL HIGH (ref 8–23)
CO2: 23 mmol/L (ref 22–32)
Calcium: 8.7 mg/dL — ABNORMAL LOW (ref 8.9–10.3)
Chloride: 101 mmol/L (ref 98–111)
Creatinine, Ser: 0.6 mg/dL — ABNORMAL LOW (ref 0.61–1.24)
GFR, Estimated: >60 mL/min (ref >60)
Glucose, Bld: 114 mg/dL — ABNORMAL HIGH (ref 70–99)
Potassium: 4 mmol/L (ref 3.5–5.1)
Sodium: 133 mmol/L — ABNORMAL LOW (ref 135–145)
Total Bilirubin: 0.6 mg/dL (ref 0.3–1.2)
Total Protein: 6.4 g/dL — ABNORMAL LOW (ref 6.5–8.1)

## 2021-10-11 LAB — GLUCOSE, CAPILLARY
Glucose-Capillary: 132 mg/dL — ABNORMAL HIGH (ref 70–99)
Glucose-Capillary: 132 mg/dL — ABNORMAL HIGH (ref 70–99)
Glucose-Capillary: 137 mg/dL — ABNORMAL HIGH (ref 70–99)
Glucose-Capillary: 141 mg/dL — ABNORMAL HIGH (ref 70–99)
Glucose-Capillary: 143 mg/dL — ABNORMAL HIGH (ref 70–99)
Glucose-Capillary: 145 mg/dL — ABNORMAL HIGH (ref 70–99)

## 2021-10-11 MED ORDER — ENSURE ENLIVE PO LIQD
237.0000 mL | Freq: Two times a day (BID) | ORAL | Status: DC
  Administered 2021-10-11 – 2021-10-17 (×8): 237 mL via ORAL

## 2021-10-11 MED ORDER — CHLORHEXIDINE GLUCONATE CLOTH 2 % EX PADS
6.0000 | MEDICATED_PAD | Freq: Every day | CUTANEOUS | Status: DC
  Administered 2021-10-11 – 2021-10-12 (×2): 6 via TOPICAL

## 2021-10-11 NOTE — Progress Notes (Signed)
PROGRESS NOTE   Subjective/Complaints: Discussed foley and he prefers to keep it in at this time. Discussed plan for trial of void on Monday Diarrhea improving.  He has no other complaints  ROS: diarrhea improving/resolved   Objective:   DG Swallowing Func-Speech Pathology  Result Date: 10/09/2021 Table formatting from the original result was not included. Objective Swallowing Evaluation: Type of Study: MBS-Modified Barium Swallow Study  Patient Details Name: Raymond Stuart MRN: [id number] Date of Birth: Sep 23, 1951 Today's Date: 10/09/2021 Time: SLP Start Time (ACUTE ONLY): 1320 -SLP Stop Time (ACUTE ONLY): 1345 SLP Time Calculation (min) (ACUTE ONLY): 25 min Past Medical History: Past Medical History: Diagnosis Date  GERD (gastroesophageal reflux disease)   Hyperlipidemia   Joint pain   Kidney stone  Past Surgical History: Past Surgical History: Procedure Laterality Date  LITHOTRIPSY   HPI: Raymond Stuart is a 70 y.o. male with medical history significant for GERD hyperlipidemia, osteoarthritis, urolithiasis who was recently admitted to [facility] (9/26-29) after falling, hitting his head on concrete and sustaining a large right frontal intraparenchymal hemorrhage, large intraventricular hemorrhage, SDH and right occipital bone fx. PEG 10/27.  D/Cd home, 9/30 admitted to [facility] then D/Cd  to SNF for rehab on 11/1.  Pt was transferred from his facility after being there for less than 24 hours to [facility] after being found covered in vomit, febrile at 101.2 F,, tachypneic/dyspneic and hypoxic in the 80s.  Per Md notes, "he responded to [facility] oxygen at 10 LPM then subsequently brought to the emergency department.  At the facility, he was started getting tube feeding around 2300 last night.  He has been bedbound.  A significant portion of the history is taken from his wife, but he was able to say a few things and answer simple questions."  Swallow eval ordered.  Pt  has hx of dysphagia diagnosed initially 10/4 and mech soft diet ordered, mentation waxed/waned and pt was made npo with Dobhoff.  MBS then conducted 10/19 and 11/1 - recommendation was npo x frazier water protocol.  Large barrier to adequate po intake has been his mentation.    Recs from 11/1 MBS were npo and ice chips with aggressive SLP at next venue of care.  11/3 clinical swallow evaluation recommended NPO except ice chips and repeat MBS. Repeat MBS completed 11/4 with recommendation to continue NPO except for ice/water PRN and nectar thick liquid trials with SLP only.  Subjective: awake and alert, sitting in radiology suite  Recommendations for follow up therapy are one component of a multi-disciplinary discharge planning process, led by the attending physician.  Recommendations may be updated based on patient status, additional functional criteria and insurance authorization. Assessment / Plan / Recommendation Clinical Impressions 10/09/2021 Clinical Impression Patient demonstrated a significant improvement in his swallow function during today's MBS as compared to MBS on 11/4. During today's MBS, he presents with a mild-moderate oral and a mild pharygneal phase dysphagia. During his oral phase of swallow, he exhibited anterior to posterior transit delays with all tested solid and liquid consistencies and decreased bolus cohesion with regular texture solids. During pharyngeal phase, he exhibited swallow initiation delay to vallecular sinus with nectar thick and thin liquid  consistencies. He exhibited instances of penetration (PAS 2) of trace amount of thin liquid barium during the swallow, however no aspiration observed with thin liquid, nectar thick or honey thick liquids during any phase of the swallow. Heavier, more viscous boluses such as regular texture solids and puree solids resulted in min-mod vallecular sinus residuals and min pyriform sinus residuals and with thinner boluses such as thin liquid  barium, only trace amount of vallecular and pyriform sinus residuals was observed. When taken with puree solids, barium tablet became briefly lodged in vallecular sinus and puree solids and thin liquids were both unsuccessful in dislodging it. Honey thick liquid sip did help to transit barium tablet out of vallecular sinus and through pharynx. Sips of thin liquid barium then did help to clear majority of vallecular and pyriform sinus residuals from honey thick liquid barium. SLP is recommending to initiate PO diet of Dys 1 solids, thin liquids and meds crushed in puree or via PEG. SLP Visit Diagnosis Dysphagia, oropharyngeal phase (R13.12) Attention and concentration deficit following -- Frontal lobe and executive function deficit following -- Impact on safety and function Mild aspiration risk;Moderate aspiration risk   Treatment Recommendations 10/09/2021 Treatment Recommendations Therapy as outlined in treatment plan below   Prognosis 10/09/2021 Prognosis for Safe Diet Advancement Good Barriers to Reach Goals -- Barriers/Prognosis Comment -- Diet Recommendations 10/09/2021 SLP Diet Recommendations Dysphagia 1 (Puree) solids;Thin liquid Liquid Administration via Cup;Straw Medication Administration Crushed with puree Compensations Minimize environmental distractions;Follow solids with liquid;Small sips/bites;Slow rate Postural Changes Seated upright at 90 degrees   Other Recommendations 10/09/2021 Recommended Consults -- Oral Care Recommendations Oral care BID;Staff/trained caregiver to provide oral care Other Recommendations -- Follow Up Recommendations Acute inpatient rehab (3hours/day) Assistance recommended at discharge Frequent or constant Supervision/Assistance Functional Status Assessment -- Frequency and Duration  10/09/2021 Speech Therapy Frequency (ACUTE ONLY) min 2x/week Treatment Duration 1 week   Oral Phase 10/09/2021 Oral Phase Impaired Oral - Pudding Teaspoon -- Oral - Pudding Cup -- Oral - Honey  Teaspoon Weak lingual manipulation;Reduced posterior propulsion;Decreased bolus cohesion;Delayed oral transit Oral - Honey Cup -- Oral - Nectar Teaspoon -- Oral - Nectar Cup Delayed oral transit;Weak lingual manipulation Oral - Nectar Straw -- Oral - Thin Teaspoon -- Oral - Thin Cup Decreased bolus cohesion;Delayed oral transit;Reduced posterior propulsion;Weak lingual manipulation Oral - Thin Straw Delayed oral transit;Decreased bolus cohesion;Weak lingual manipulation;Reduced posterior propulsion Oral - Puree Reduced posterior propulsion;Weak lingual manipulation;Delayed oral transit Oral - Mech Soft -- Oral - Regular Delayed oral transit;Impaired mastication;Weak lingual manipulation;Reduced posterior propulsion Oral - Multi-Consistency -- Oral - Pill WFL Oral Phase - Comment --  Pharyngeal Phase 10/09/2021 Pharyngeal Phase Impaired Pharyngeal- Pudding Teaspoon -- Pharyngeal -- Pharyngeal- Pudding Cup -- Pharyngeal -- Pharyngeal- Honey Teaspoon -- Pharyngeal -- Pharyngeal- Honey Cup Pharyngeal residue - valleculae;Pharyngeal residue - pyriform Pharyngeal -- Pharyngeal- Nectar Teaspoon -- Pharyngeal -- Pharyngeal- Nectar Cup Delayed swallow initiation-vallecula Pharyngeal Material does not enter airway Pharyngeal- Nectar Straw NT Pharyngeal -- Pharyngeal- Thin Teaspoon -- Pharyngeal -- Pharyngeal- Thin Cup Reduced airway/laryngeal closure;Delayed swallow initiation-vallecula;Pharyngeal residue - valleculae;Pharyngeal residue - pyriform;Penetration/Aspiration during swallow Pharyngeal Material enters airway, remains ABOVE vocal cords then ejected out Pharyngeal- Thin Straw Delayed swallow initiation-vallecula;Reduced airway/laryngeal closure;Penetration/Aspiration during swallow;Pharyngeal residue - pyriform;Pharyngeal residue - valleculae Pharyngeal Material enters airway, remains ABOVE vocal cords then ejected out Pharyngeal- Puree Pharyngeal residue - valleculae;Pharyngeal residue - pyriform Pharyngeal --  Pharyngeal- Mechanical Soft -- Pharyngeal -- Pharyngeal- Regular Pharyngeal residue - valleculae;Pharyngeal residue - pyriform Pharyngeal -- Pharyngeal- Multi-consistency --  Pharyngeal -- Pharyngeal- Pill Delayed swallow initiation-vallecula;Pharyngeal residue - valleculae;Reduced epiglottic inversion Pharyngeal -- Pharyngeal Comment --  Cervical Esophageal Phase  10/09/2021 Cervical Esophageal Phase WFL Pudding Teaspoon -- Pudding Cup -- Honey Teaspoon -- Honey Cup -- Nectar Teaspoon -- Nectar Cup -- Nectar Straw -- Thin Teaspoon -- Thin Cup -- Thin Straw -- Puree -- Mechanical Soft -- Regular -- Multi-consistency -- Pill -- Cervical Esophageal Comment -- Angela Nevin, MA, CCC-SLP Speech Therapy                     Recent Labs    10/10/21 2002 10/11/21 0631  WBC 9.3 8.7  HGB 12.5* 12.4*  HCT 39.3 39.6  PLT 352 371   Recent Labs    10/09/21 0517 10/10/21 2002 10/11/21 0631  NA 136  --  133*  K 3.9  --  4.0  CL 102  --  101  CO2 23  --  23  GLUCOSE 134*  --  114*  BUN 26*  --  25*  CREATININE 0.55* 0.62 0.60*  CALCIUM 9.0  --  8.7*    Intake/Output Summary (Last 24 hours) at 10/11/2021 1314 Last data filed at 10/11/2021 1017 Gross per 24 hour  Intake 190 ml  Output 1800 ml  Net -1610 ml     Pressure Injury Buttocks Left;Anterior;Medial Deep Tissue Pressure Injury - Purple or maroon localized area of discolored intact skin or blood-filled blister due to damage of underlying soft tissue from pressure and/or shear. 1.5X1 inner buttock beside s (Active)     Location: Buttocks  Location Orientation: Left;Anterior;Medial  Staging: Deep Tissue Pressure Injury - Purple or maroon localized area of discolored intact skin or blood-filled blister due to damage of underlying soft tissue from pressure and/or shear.  Wound Description (Comments): 1.5X1 inner buttock beside stage 2  Present on Admission: Yes    Physical Exam: Vital Signs Blood pressure 120/66, pulse 94, temperature 98.1  F (36.7 C), temperature source Oral, resp. rate 14, height 6' (1.829 m), weight 75.5 kg, SpO2 95 %. Gen: no distress, normal appearing HEENT: oral mucosa pink and moist, NCAT Cardio: Reg rate Pulmonary:     Comments: Decreased at bases B/L- good air movement in apex B/L- no W/R/R Abdominal:     Comments: Gastrostomy tube in place.- looks OK Soft, NT, ND, hyperactive BS  Genitourinary:    Comments: Foley in place- medium amber urine in bag Musculoskeletal:     Cervical back: Normal range of motion. No rigidity.     Comments: RUE 4+/5 deltoid- 5-/5 otherwise LUE- deltoid 4/5; otherwise 4+/5 RLE- 5-/5 LLE- HF 2-/5; KE 2/5; DF 4-/5, PF 4-/5   Skin:    Comments: IV R forearm- looks OK MASD 2 small spots in crease of buttocks B/L  Thrush in mouth- significant- red tongue/beefy  Neurological:     Comments: Patient is alert and interactive but did not initiate conversation.  Makes eye contact with examiner.  Provides his name and age but slow to process.  Follows simple commands.  Limited medical historian. Slowed processing; intact to light touch in all 4 extremities No conus or hoffman's seen  Psychiatric:     Comments: Flat affect    Assessment/Plan: 1. Functional deficits which require 3+ hours per day of interdisciplinary therapy in a comprehensive inpatient rehab setting. Physiatrist is providing close team supervision and 24 hour management of active medical problems listed below. Physiatrist and rehab team continue to assess barriers to discharge/monitor patient  progress toward functional and medical goals  Care Tool:  Bathing  Bathing activity did not occur: Safety/medical concerns Body parts bathed by patient: Right arm, Left arm, Chest, Abdomen, Right upper leg, Left upper leg, Face   Body parts bathed by helper: Front perineal area, Buttocks, Right lower leg, Left lower leg     Bathing assist Assist Level: Maximal Assistance - Patient 24 - 49%     Upper Body  Dressing/Undressing Upper body dressing Upper body dressing/undressing activity did not occur (including orthotics): Safety/medical concerns What is the patient wearing?: Pull over shirt    Upper body assist Assist Level: Maximal Assistance - Patient 25 - 49%    Lower Body Dressing/Undressing Lower body dressing    Lower body dressing activity did not occur: Safety/medical concerns What is the patient wearing?: Underwear/pull up, Pants     Lower body assist Assist for lower body dressing: Total Assistance - Patient < 25%     Toileting Toileting    Toileting assist Assist for toileting: Total Assistance - Patient < 25%     Transfers Chair/bed transfer  Transfers assist  Chair/bed transfer activity did not occur: Safety/medical concerns  Chair/bed transfer assist level: 2 Helpers     Locomotion Ambulation   Ambulation assist   Ambulation activity did not occur: Safety/medical concerns          Walk 10 feet activity   Assist           Walk 50 feet activity   Assist           Walk 150 feet activity   Assist           Walk 10 feet on uneven surface  activity   Assist           Wheelchair     Assist     Wheelchair activity did not occur: Safety/medical concerns         Wheelchair 50 feet with 2 turns activity    Assist            Wheelchair 150 feet activity     Assist      Assist Level: Maximal Assistance - Patient 25 - 49%   Blood pressure 120/66, pulse 94, temperature 98.1 F (36.7 C), temperature source Oral, resp. rate 14, height 6' (1.829 m), weight 75.5 kg, SpO2 95 %.    Medical Problem List and Plan: 1.  Debility secondary to sepsis/multi medical with history of traumatic subdural hematoma, traumatic intraparenchymal hemorrhage/occipital fracture, aspiration pneumonia, ischemic CVA             -patient may  shower             -ELOS/Goals: 10-14 days- supervision  Continue CIR 2.   Antithrombotics: -DVT/anticoagulation:  Pharmaceutical: Lovenox check vascular study             -antiplatelet therapy: Aspirin 81 mg daily 3. Pain Management:Voltaren gel, Lidoderm patch as directed, oxycodone as needed 4. Mood: Provide emotional support             -antipsychotic agents: N/A 5. Neuropsych: This patient is not capable of making decisions on his own behalf. 6. Skin/Wound Care: Routine skin checks 7. Fluids/Electrolytes/Nutrition: Routine in and outs with follow-up chemistries 8.  Dysphagia/gastrostomy tube.  Gastrostomy tube placed 10/27 at [facility] dysphagia #2 diet with thins, but meds crushed in puree- cannot take Flomax!.  Follow-up speech therapy.   9.  AKI.  Follow-up chemistries 10.  Acute  urinary retention/Pseudomonas Klebsiella UTI.  Foley catheter tube reinserted after failed voiding trial.  Maintain on Flomax and will again attempt voiding trial while on rehab.  Completed course of IV Zosyn 11.  Hyperlipidemia.  Zocor 12. Thrush- continue Diflucan 200 mg x1 and 100 mg daily x 6 days and con't nystatin 13. Diarrhea- resolved, continue to monitor.  14. Urinary retention- has Foley- will keep at least one per day as per patient's wishes (hx of penile trauma due to cathing at [facility])-stop Flomax since cannot be crushed/put in PEG- start Bethanachol 5 mg QID-   LOS: 1 days A FACE TO FACE EVALUATION WAS PERFORMED  Raymond Stuart Raymond Stuart 10/11/2021, 1:14 PM

## 2021-10-11 NOTE — Progress Notes (Signed)
Initial Nutrition Assessment  DOCUMENTATION CODES:   Severe malnutrition in context of acute illness/injury  INTERVENTION:  Provide Ensure Enlive po BID, each supplement provides 350 kcal and 20 grams of protein.  Provide Jevity 1.2 cal formula via PEG at new goal rate of 70 ml/hr x 20 hours (may hold TF for up to 4 hours for therapy).  Provide 45 ml Prosource TF TID per tube.   Provide Juven BID, each packet provides 95 calories, 2.5 grams of protein.  Free water flushes of 150 ml q 4 hours per tube.   Tube feeding regimen to provide 1990 kcal (90% of kcal needs), 111 grams of protein (97% of protein needs), 2034 ml free water.  NUTRITION DIAGNOSIS:   Severe Malnutrition related to acute illness (SDH) as evidenced by percent weight loss, moderate fat depletion, moderate muscle depletion, severe muscle depletion.  GOAL:   Patient will meet greater than or equal to 90% of their needs  MONITOR:   PO intake, Supplement acceptance, Diet advancement, Labs, Weight trends, TF tolerance, Skin, I & O's  REASON FOR ASSESSMENT:   Consult Enteral/tube feeding initiation and management  ASSESSMENT:   70 year old right-handed male with history of GERD, hyperlipidemia, urolithiasis who was recently admitted to [facility] 9/26-9/29 and then at [facility] 9/30-11/1 after mechanical fall sustaining subdural hematoma traumatic intracranial hemorrhage ventricular hemorrhage in the setting of acute head injury with occipital fracture complicated by ischemic stroke follow-up aspiration pneumonia in the setting of stroke as well as PEG tube placement 10/27 complicated by AKI with urinary retention indwelling Foley catheter tube. Presented 09/27/2021 to [facility] from skilled nursing facility after being found  covered in vomitus as well as fever urine culture greater 100,000 Klebsiella as well as Pseudomonas with given completed course of abx. Therapy evaluations completed due to patient  decreased functional mobility was admitted to CIR.  Diet has been advanced to a dysphagia 2 diet with thin liquids. Pt with thrush which has been affecting PO intake. Meal completion poor at 25%. Plans to continue tube feeding continuously until PO intake and thrush improves. Family has brought in food from outside to encourage PO intake, however acknowledge pt may refuse PO due to his thrush and taste changes. Usual body weight reported as ~206 lbs which he reports last weighing in August 2022. Pt with a 19% weight loss in 2 months, which is significant for time frame. RD to additionally order Ensure to aid in PO intake. RD to modify tube feeds once PO intake improves.   NUTRITION - FOCUSED PHYSICAL EXAM:  Flowsheet Row Most Recent Value  Orbital Region Mild depletion  Upper Arm Region Moderate depletion  Thoracic and Lumbar Region No depletion  Buccal Region Moderate depletion  Temple Region Mild depletion  Clavicle Bone Region Moderate depletion  Clavicle and Acromion Bone Region Moderate depletion  Scapular Bone Region Unable to assess  Dorsal Hand Unable to assess  Patellar Region Severe depletion  Anterior Thigh Region Severe depletion  Posterior Calf Region Severe depletion  Edema (RD Assessment) Mild  Hair Reviewed  Eyes Reviewed  Mouth Reviewed  Skin Reviewed  Nails Reviewed      Labs and medications reviewed.   Diet Order:   Diet Order             DIET DYS 2 Room service appropriate? Yes; Fluid consistency: Thin  Diet effective now  EDUCATION NEEDS:   Not appropriate for education at this time  Skin:  Skin Assessment: Skin Integrity Issues: Skin Integrity Issues:: DTI DTI: buttocks  Last BM:  11/16  Height:   Ht Readings from Last 1 Encounters:  10/10/21 6' (1.829 m)    Weight:   Wt Readings from Last 1 Encounters:  10/11/21 75.5 kg   BMI:  Body mass index is 22.57 kg/m.  Estimated Nutritional Needs:   Kcal:   2200-2400  Protein:  115-125 grams  Fluid:  >/= 2 L/day  Roslyn Smiling, MS, RD, LDN RD pager number/after hours weekend pager number on Amion.

## 2021-10-11 NOTE — Evaluation (Signed)
Speech Language Pathology Assessment and Plan  Patient Details  Name: Raymond Stuart MRN: [id number] Date of Birth: August 03, 1951  SLP Diagnosis: Dysphagia;Cognitive Impairments;Voice disorder  Rehab Potential: Good ELOS: 3-4 weeks   Today's Date: 10/11/2021 SLP Individual Time: 1100-1200 SLP Individual Time Calculation (min): 60 min  Hospital Problem: Principal Problem:   Sepsis due to undetermined organism ([place]) Active Problems:   Type 2 diabetes mellitus with hyperglycemia (HCC)   Protein-calorie malnutrition, severe   Ischemic stroke ([place])   PEG (percutaneous endoscopic gastrostomy) status ([place])   Sepsis ([place])  Past Medical History:  Past Medical History:  Diagnosis Date   GERD (gastroesophageal reflux disease)    Hyperlipidemia    Joint pain    Kidney stone    Past Surgical History:  Past Surgical History:  Procedure Laterality Date   LITHOTRIPSY      Assessment / Plan / Recommendation Clinical Impression  HPI: [name] is a 70 year old right-handed male with history of GERD, hyperlipidemia, urolithiasis who was recently admitted to [facility] 9/26-9/29 and then at [facility] 9/30-11/1 after mechanical fall sustaining subdural hematoma traumatic intracranial hemorrhage ventricular hemorrhage in the setting of acute head injury with occipital fracture complicated by ischemic stroke follow-up aspiration pneumonia in the setting of stroke as well as PEG tube placement 35/36 complicated by AKI with urinary retention indwelling Foley catheter tube was placed and discharged to facility 11/1.  Per chart review prior to latest admission patient lives with spouse in [place].  1 level home.  Independent prior to initial hospital admission.  Presented 09/27/2021 to [facility] long hospital from skilled nursing facility after being found covered in vomitus as well as fever one 1.2, tachypneic/dyspneic hypoxic in the 80s. Patient was placed on intravenous Zosyn suspect sepsis completing  course 10/05/2021. His diet has been initiated dysphagia #1 thin liquids and remains on tube feeds for nutritional support.    Patient demonstrates mild cognitive impairments characterized by decreased short-term recall, information processing, alternating attention, emergent awareness, and higher level problem solving skills. SLP administered the [facility] Mental Status Examination (SLUMS) and patient scored 21/30 points with a score of 27 or above considered normal. Patient's overall auditory comprehension and verbal expression appeared [facility] for all tasks assessed and patient was 100% intelligible at the conversation level, though presents with decreased vocal intensity.   Most recent MBS dated 11/14 revealed improved oropharyngeal swallow function with recommendations to initiate PO diet of dysphagia 1 diet/thin liquids and meds crushed in puree or via PEG. Pt seen by acute care SLP who recommended diet advancement to Dys 2 textures on 11/15. Per today's clinical swallow evaluation, pt exhibited prolonged yet functional mastication of dys 2 textures. Alternating between solids and liquids promoted oral clearance. Pt without overt s/sx of aspiration with both solids and liquid intake. Recommend continuation of current diet with full supervision for adherence to safe swallowing precautions. Pt presented with decreased awareness of swallowing deficits stating "I haven't had any issues with swallowing."  Patient would benefit from skilled SLP intervention to maximize cognitive-linguistic and swallowing functioning and overall functional independence prior to discharge.    Skilled Therapeutic Interventions          Pt participated in [place] Status Examination (SLUMS) as well as further non-standardized assessments of cognitive-linguistic, speech, and language function. Also completed clinical swallow evaluation. Please see above.     SLP Assessment  Patient will need skilled  Speech Lanaguage Pathology Services during CIR admission    Recommendations  SLP Diet Recommendations: Dysphagia 1 (Puree);Thin Liquid Administration via: Cup;Straw Medication Administration: Crushed with puree Supervision: Patient able to self feed;Full supervision/cueing for compensatory strategies Compensations: Minimize environmental distractions;Follow solids with liquid;Small sips/bites;Slow rate Postural Changes and/or Swallow Maneuvers: Seated upright 90 degrees;Upright 30-60 min after meal Oral Care Recommendations: Oral care QID;Staff/trained caregiver to provide oral care Recommendations for Other Services: Neuropsych consult Patient destination: Home Follow up Recommendations: Other (comment) (TBD) Equipment Recommended: None recommended by SLP    SLP Frequency 3 to 5 out of 7 days   SLP Duration  SLP Intensity  SLP Treatment/Interventions 3-4 weeks  Minumum of 1-2 x/day, 30 to 90 minutes  Cognitive remediation/compensation;Cueing hierarchy;Dysphagia/aspiration precaution training;Functional tasks;Patient/family education;Therapeutic Activities    Pain  No pain  Prior Functioning Cognitive/Linguistic Baseline:  (pt endorsed forgetfulness at baseline) Type of Home: House  Lives With: Spouse;Daughter Available Help at Discharge: Family;Available 24 hours/day Vocation: Retired  Programmer, systems Overall Cognitive Status: Impaired/Different from baseline Arousal/Alertness: Awake/alert Orientation Level: Oriented X4 Year: 2022 Month: November Day of Week: Correct Attention: Sustained;Selective Focused Attention: Appears intact Sustained Attention: Appears intact Selective Attention: Impaired Memory: Impaired Memory Impairment: Decreased recall of new information Awareness: Impaired Awareness Impairment: Emergent impairment Problem Solving: Impaired Problem Solving Impairment: Verbal basic Executive Function: Self Correcting;Self Monitoring Self  Monitoring: Impaired Self Correcting: Impaired Safety/Judgment: Impaired  Comprehension Auditory Comprehension Overall Auditory Comprehension: Appears within functional limits for tasks assessed Expression Expression Primary Mode of Expression: Verbal Verbal Expression Overall Verbal Expression: Appears within functional limits for tasks assessed Oral Motor Oral Motor/Sensory Function Overall Oral Motor/Sensory Function: Within functional limits Motor Speech Overall Motor Speech: Appears within functional limits for tasks assessed Respiration: Within functional limits Phonation: Low vocal intensity  Care Tool Care Tool Cognition Ability to hear (with hearing aid or hearing appliances if normally used Ability to hear (with hearing aid or hearing appliances if normally used): 1. Minimal difficulty - difficulty in some environments (e.g. when person speaks softly or setting is noisy)   Expression of Ideas and Wants Expression of Ideas and Wants: 3. Some difficulty - exhibits some difficulty with expressing needs and ideas (e.g, some words or finishing thoughts) or speech is not clear   Understanding Verbal and Non-Verbal Content Understanding Verbal and Non-Verbal Content: 3. Usually understands - understands most conversations, but misses some part/intent of message. Requires cues at times to understand  Memory/Recall Ability Memory/Recall Ability : Current season;That he or she is in a hospital/hospital unit   Bedside Swallowing Assessment General Date of Onset: 09/27/21 Previous Swallow Assessment: MBS on 11/14 Diet Prior to this Study: Thin liquids;Dysphagia 1 (puree) (Pt recommended for Dys 1 textures, thin liquids per MBS on 11/14. Pt recommended for dys 2 diet advancement on 11/15 though unsure if diet was officially changed) Respiratory Status: Room air History of Recent Intubation: No Behavior/Cognition: Alert;Cooperative Oral Cavity - Dentition: Adequate natural  dentition Self-Feeding Abilities: Able to feed self;Needs assist Vision: Functional for self-feeding Patient Positioning: Upright in bed Baseline Vocal Quality: Low vocal intensity Volitional Cough: Strong Volitional Swallow: Able to elicit  Oral Care Assessment Does patient have any of the following "high(er) risk" factors?: Diet - patient on tube feedings Patient is HIGH RISK: Non-ventilated: Order set for Adult Oral Care Protocol initiated - "High Risk Patients - Non-Ventilated" option selected  (see row information) Ice Chips Ice chips: Not tested Thin Liquid Thin Liquid: Within functional limits Presentation: Cup;Straw Nectar Thick Nectar Thick Liquid: Not tested Honey Thick Honey Thick Liquid: Not tested  Puree Puree: Impaired Presentation: Spoon Oral Phase Impairments: Reduced lingual movement/coordination Oral Phase Functional Implications: Prolonged oral transit Solid Solid: Impaired Presentation: Self Fed Oral Phase Impairments: Reduced lingual movement/coordination Oral Phase Functional Implications: Impaired mastication;Prolonged oral transit;Oral residue Pharyngeal Phase Impairments: Multiple swallows BSE Assessment Risk for Aspiration Impact on safety and function: Mild aspiration risk;Moderate aspiration risk Other Related Risk Factors: History of pneumonia;History of dysphagia;History of GERD;Decreased respiratory status;Deconditioning;Previous CVA;Decreased management of secretions  Short Term Goals: Week 1: SLP Short Term Goal 1 (Week 1): Patient will consume Dys 2 textures and thin liquids without overt s/sx of aspiration and min A for safe swallowing precautions and strategies SLP Short Term Goal 2 (Week 1): Patient will complete simulated medication and/or money management tasks with min A verbal cues to achieve at least 80% accuracy SLP Short Term Goal 3 (Week 1): Pt will demonstrate awareness of errors and apply appropriate corrections with min-to-mod A  verbal and visual cues SLP Short Term Goal 4 (Week 1): Patient will complete higher level problem solving tasks with min-to-mod A verbal and visual cues SLP Short Term Goal 5 (Week 1): Patient will participate in RMT evaluation to determine candidacy to improve breath support for speech/vocal intensity  Refer to Care Plan for Long Term Goals  Recommendations for other services: Neuropsych  Discharge Criteria: Patient will be discharged from SLP if patient refuses treatment 3 consecutive times without medical reason, if treatment goals not met, if there is a change in medical status, if patient makes no progress towards goals or if patient is discharged from hospital.  The above assessment, treatment plan, treatment alternatives and goals were discussed and mutually agreed upon: by patient  Patty Sermons 10/11/2021, 5:20 PM

## 2021-10-11 NOTE — Progress Notes (Signed)
Venous Doppler report shows finding consistent with DVT involving the left proximal mid femoral vein, and left proximal profunda vein.  Appears to be isolated after speaking with vascular.  Patient does have a history of traumatic subdural hematoma/intraparenchymal hemorrhage.  At this time continue low-dose aspirin and will plan repeat Doppler study for any propagation.

## 2021-10-11 NOTE — Progress Notes (Signed)
Occupational Therapy Session Note  Patient Details  Name: Raymond Stuart MRN: [id number] Date of Birth: 1951/10/02  Today's Date: 10/11/2021 OT Individual Time: 0932-6712 OT Individual Time Calculation (min): 30 min    Short Term Goals: Week 1:  OT Short Term Goal 1 (Week 1): Pt will completed sit > stand with Max A x1 in prep for functional ADL OT Short Term Goal 2 (Week 1): Pt will complete UBD with mod A OT Short Term Goal 3 (Week 1): Pt will complete LBD with max A  Skilled Therapeutic Interventions/Progress Updates:  Skilled OT intervention completed with focus on education with pt's wife present. Pt received upright in bed, agreeable to session, with pt's wife present. Session largely focused on discussion with pt's medical history per wife's explanation, with pt's wife verbalizing/presenting emotional upset about pt's status and CLOF. Pt's wife with concern that she would need to come into the hospital to assist her husband with all self-care prior/following therapy. Educated on purpose of OT, rehab process and goals, reassuring her that self-care is OT focus and domain. Discussed pt's DME at home, reporting he has a shower chair in a walk in shower with small lip to enter, but has no BSC. Pt denied the need to reposition in bed, and was left with HOB elevated > 30 degrees, bed alarm on and all needs in reach with wife in room at end of session.  Therapy Documentation Precautions:  Precautions Precautions: Fall Precaution Comments: g-tube, orthostatic, left hip and knee pain Restrictions Weight Bearing Restrictions: No  Pain: No c/o pain   Therapy/Group: Individual Therapy  Isidro Monks E Deleah Tison 10/11/2021, 3:58 PM

## 2021-10-11 NOTE — Progress Notes (Addendum)
Bilateral lower extremity venous duplex completed. Refer to "CV Proc" under chart review to view preliminary results.  Preliminary results discussed with Renda Rolls, PA-C.  10/11/2021 2:58 PM Eula Fried., MHA, RVT, RDCS, RDMS

## 2021-10-11 NOTE — Plan of Care (Signed)
  Problem: RH Swallowing Goal: LTG Patient will consume least restrictive diet using compensatory strategies with assistance (SLP) Description: LTG:  Patient will consume least restrictive diet using compensatory strategies with assistance (SLP) Flowsheets (Taken 10/11/2021 1722) LTG: Pt Patient will consume least restrictive diet using compensatory strategies with assistance of (SLP): Modified Independent Goal: LTG Patient will participate in dysphagia therapy to increase swallow function with assistance (SLP) Description: LTG:  Patient will participate in dysphagia therapy to increase swallow function with assistance (SLP) Flowsheets (Taken 10/11/2021 1722) LTG: Pt will participate in dysphagia therapy to increase swallow function with assistance of (SLP): Supervision Goal: LTG Pt will demonstrate functional change in swallow as evidenced by bedside/clinical objective assessment (SLP) Description: LTG: Patient will demonstrate functional change in swallow as evidenced by bedside/clinical objective assessment (SLP) Flowsheets (Taken 10/11/2021 1722) LTG: Patient will demonstrate functional change in swallow as evidenced by bedside/clinical objective assessment: Oropharyngeal swallow   Problem: RH Problem Solving Goal: LTG Patient will demonstrate problem solving for (SLP) Description: LTG:  Patient will demonstrate problem solving for basic/complex daily situations with cues  (SLP) Flowsheets (Taken 10/11/2021 1722) LTG: Patient will demonstrate problem solving for (SLP): Complex daily situations LTG Patient will demonstrate problem solving for: Supervision   Problem: RH Awareness Goal: LTG: Patient will demonstrate awareness during functional activites type of (SLP) Description: LTG: Patient will demonstrate awareness during functional activites type of (SLP) Flowsheets (Taken 10/11/2021 1722) Patient will demonstrate during cognitive/linguistic activities awareness type of: Emergent LTG:  Patient will demonstrate awareness during cognitive/linguistic activities with assistance of (SLP): Supervision   Problem: RH Memory Goal: LTG Patient will use memory compensatory aids to (SLP) Description: LTG:  Patient will use memory compensatory aids to recall biographical/new, daily complex information with cues (SLP) Flowsheets (Taken 10/11/2021 1723) LTG: Patient will use memory compensatory aids to (SLP): Supervision

## 2021-10-11 NOTE — Evaluation (Signed)
Occupational Therapy Assessment and Plan  Patient Details  Name: Raymond Stuart MRN: [id number] Date of Birth: [birth year]  OT Diagnosis: abnormal posture, acute pain, muscular wasting and disuse atrophy, muscle weakness (generalized), and pain in joint Rehab Potential: Rehab Potential (ACUTE ONLY): Fair ELOS: 3-4 weeks   Today's Date: 10/11/2021 OT Individual Time: 3875-6433 OT Individual Time Calculation (min): 55 min     Hospital Problem: Principal Problem:   Sepsis due to undetermined organism ([place]) Active Problems:   Type 2 diabetes mellitus with hyperglycemia ([place])   Protein-calorie malnutrition, severe   Ischemic stroke ([place])   PEG (percutaneous endoscopic gastrostomy) status ([place])   Sepsis ([place])   Past Medical History:  Past Medical History:  Diagnosis Date   GERD (gastroesophageal reflux disease)    Hyperlipidemia    Joint pain    Kidney stone    Past Surgical History:  Past Surgical History:  Procedure Laterality Date   LITHOTRIPSY      Assessment & Plan Clinical Impression:   Patient is a 70 year old right-handed male with history of GERD, hyperlipidemia, urolithiasis who was recently admitted to [facility] 9/26-9/29 and then at [facility] 9/30-11/1 after mechanical fall sustaining subdural hematoma traumatic intracranial hemorrhage ventricular hemorrhage in the setting of acute head injury with occipital fracture complicated by ischemic stroke follow-up aspiration pneumonia in the setting of stroke as well as PEG tube placement 29/51 complicated by AKI with urinary retention indwelling Foley catheter tube was placed and discharged to facility 11/1.  Per chart review prior to latest admission patient lives with spouse in [place].  1 level home.  Independent prior to initial hospital admission.  Presented 09/27/2021 to [facility] from skilled nursing facility after being found  covered in vomitus as well as fever one 1.2, tachypneic/dyspneic hypoxic in the  80s.  Responded to nonrebreather mask oxygen 10 L.  Chest x-ray showed low lung volumes without radiographic evidence of acute cardiopulmonary disease.  Admission chemistries glucose 161, BUN 27, WBC 15,300, urine culture greater 100,000 Klebsiella as well as Pseudomonas, blood cultures no growth to date, lactic acid 3.1.  Patient was placed on intravenous Zosyn suspect sepsis completing course 10/05/2021.  Hospital course bouts of diarrhea felt to be related to tube feeds.  His diet has been initiated dysphagia #1 thin liquids and remains on tube feeds for nutritional support.  Subcutaneous Lovenox for DVT prophylaxis.  He remains on low-dose aspirin for history of CVA.  Initial urinary retention Foley tube in place voiding trial Foley tube removed maintain on Flomax and failed voiding trial with reinsertion of Foley tube.  Monitoring of LFTs initially elevated hepatitis panel negative and monitored.  Persistent left hip and left knee pain x-ray showed no acute findings topical Lidoderm patch was placed. WOC follow-up for stage II pressure injury to the sacrum with wound care as directed.   Patient transferred to CIR on 10/10/2021 .  Patient currently requires total with basic self-care skills secondary to muscle weakness, decreased cardiorespiratoy endurance, impaired timing and sequencing, abnormal tone, decreased coordination, and decreased motor planning, and decreased strength, balance. .  Prior to hospitalization, patient could complete all basic ADLs independently.  Patient will benefit from skilled intervention to decrease level of assist with basic self-care skills and increase independence with basic self-care skills prior to discharge home with care partner.  Anticipate patient will require 24 hour supervision and minimal physical assistance and follow up home health.  OT - End of Session Activity Tolerance: Tolerates < 10 min  activity, no significant change in vital signs Endurance Deficit:  Yes Endurance Deficit Description: Requiring increased time for ADLs, and increased breaks before all transfers OT Assessment Rehab Potential (ACUTE ONLY): Fair OT Barriers to Discharge: Decreased caregiver support;Home environment access/layout;Wound Care OT Patient demonstrates impairments in the following area(s): Balance;Behavior;Endurance;Motor;Pain;Perception;Safety;Sensory;Skin Integrity OT Basic ADL's Functional Problem(s): Grooming;Eating;Bathing;Dressing;Toileting OT Advanced ADL's Functional Problem(s): Simple Meal Preparation OT Transfers Functional Problem(s): Toilet;Tub/Shower OT Plan OT Intensity: Minimum of 1-2 x/day, 45 to 90 minutes OT Frequency: 5 out of 7 days OT Duration/Estimated Length of Stay: 3-4 weeks OT Treatment/Interventions: Balance/vestibular training;Self Care/advanced ADL retraining;Therapeutic Exercise;Wheelchair propulsion/positioning;Disease mangement/prevention;DME/adaptive equipment instruction;Pain management;Skin care/wound managment;UE/LE Strength taining/ROM;Community reintegration;Patient/family education;UE/LE Coordination activities;Discharge planning;Functional mobility training;Psychosocial support;Therapeutic Activities OT Self Feeding Anticipated Outcome(s): Mod I OT Basic Self-Care Anticipated Outcome(s): Min-Mod A OT Toileting Anticipated Outcome(s): Min-Mod A OT Bathroom Transfers Anticipated Outcome(s): Min-Mod A OT Recommendation Recommendations for Other Services: Speech consult Patient destination: Home Follow Up Recommendations: Home health OT;24 hour supervision/assistance Equipment Recommended: To be determined Equipment Details: Reports having BSC, unclear on shower equipment at this time   OT Evaluation Precautions/Restrictions  Precautions Precautions: Fall Precaution Comments: g-tube, orthostatic, left hip and knee pain Restrictions Weight Bearing Restrictions: No Vital Signs BP sitting EOB- 120/85 Home Living/Prior  Functioning Home Living Family/patient expects to be discharged to:: Private residence Living Arrangements: Spouse/significant other Available Help at Discharge: Family, Available 24 hours/day Type of Home: House Home Access: Level entry Home Layout: One level Bathroom Shower/Tub: Multimedia programmer: Standard Bathroom Accessibility: Yes Additional Comments: lived with wife before accident and stroke. discharged to SNF for one day before admittance to [facility]. At baseline was independent with mobility and ADLs  Lives With: Spouse, Daughter IADL History Homemaking Responsibilities: Yes Meal Prep Responsibility: Primary Homemaking Comments: Pt reports being idependent with all IADLs prior to first admission Current License: Yes Mode of Transportation: Car Occupation: Retired Leisure and Hobbies: Careers information officer Prior Function Level of Independence: Independent with basic ADLs, Independent with homemaking with ambulation, Independent with gait  Able to Take Stairs?: Yes Driving: Yes Vocation: Retired Surveyor, mining Baseline Vision/History: 1 Wears glasses (readers) Ability to See in Adequate Light: 0 Adequate Patient Visual Report: Blurring of vision (clears with eye blinking) Perception  Perception: Impaired Praxis Praxis: Impaired Praxis Impairment Details: Motor planning;Initiation Cognition Overall Cognitive Status: Within Functional Limits for tasks assessed Arousal/Alertness: Awake/alert Orientation Level: Person;Place;Situation Person: Oriented Place: Oriented Situation: Oriented Year: 2022 Month: November Day of Week: Correct Memory: Appears intact Immediate Memory Recall: Sock;Blue;Bed Memory Recall Sock: Without Cue Memory Recall Blue: Without Cue Memory Recall Bed: Without Cue Attention: Focused;Alternating;Divided Focused Attention: Impaired Divided Attention: Impaired Awareness: Appears intact Problem Solving: Impaired Safety/Judgment:  Impaired Sensation Sensation Light Touch: Appears Intact Hot/Cold: Appears Intact Coordination Gross Motor Movements are Fluid and Coordinated: No Fine Motor Movements are Fluid and Coordinated: No Finger Nose Finger Test: Increased time and decreased precision Motor  Motor Motor: Other (comment) Motor - Skilled Clinical Observations: grossly uncoordinated due to pain, fatigue, weakness, and deconditioning  Trunk/Postural Assessment  Cervical Assessment Cervical Assessment: Exceptions to [facility] (forward head) Thoracic Assessment Thoracic Assessment: Exceptions to [facility] (kyphosis) Lumbar Assessment Lumbar Assessment: Within Functional Limits Postural Control Postural Control: Deficits on evaluation  Balance Balance Balance Assessed: Yes Static Sitting Balance Static Sitting - Balance Support: Bilateral upper extremity supported;Feet supported Static Sitting - Level of Assistance: 5: Stand by assistance (supervision) Dynamic Sitting Balance Dynamic Sitting - Balance Support: Right upper extremity supported Dynamic Sitting - Level of Assistance:  5: Stand by assistance (supervision) Static Standing Balance Static Standing - Balance Support: Bilateral upper extremity supported;During functional activity Static Standing - Level of Assistance: 1: +2 Total assist Dynamic Standing Balance Dynamic Standing - Balance Support: Bilateral upper extremity supported;During functional activity Dynamic Standing - Level of Assistance: 1: +2 Total assist (stedy) Extremity/Trunk Assessment RUE Assessment RUE Assessment: Within Functional Limits Active Range of Motion (AROM) Comments: WFL during self-care tasks General Strength Comments: Grossly 4/5 LUE Assessment LUE Assessment: Within Functional Limits Active Range of Motion (AROM) Comments: WFL during self-care tasks General Strength Comments: Grossly 4/5  Care Tool Care Tool Self Care Eating   Eating Assist Level: Supervision/Verbal  cueing    Oral Care    Oral Care Assist Level: Set up assist    Bathing   Body parts bathed by patient: Right arm;Left arm;Chest;Abdomen;Right upper leg;Left upper leg;Face Body parts bathed by helper: Front perineal area;Buttocks;Right lower leg;Left lower leg   Assist Level: Maximal Assistance - Patient 24 - 49%    Upper Body Dressing(including orthotics)   What is the patient wearing?: Pull over shirt   Assist Level: Maximal Assistance - Patient 25 - 49%    Lower Body Dressing (excluding footwear)   What is the patient wearing?: Underwear/pull up;Pants Assist for lower body dressing: Total Assistance - Patient < 25%    Putting on/Taking off footwear   What is the patient wearing?: Ted hose;Non-skid slipper socks Assist for footwear: Total Assistance - Patient < 25%       Care Tool Toileting Toileting activity   Assist for toileting: Total Assistance - Patient < 25%     Care Tool Bed Mobility Roll left and right activity   Roll left and right assist level: Moderate Assistance - Patient 50 - 74%    Sit to lying activity   Sit to lying assist level: Moderate Assistance - Patient 50 - 74%    Lying to sitting on side of bed activity   Lying to sitting on side of bed assist level: the ability to move from lying on the back to sitting on the side of the bed with no back support.: Moderate Assistance - Patient 50 - 74%     Care Tool Transfers Sit to stand transfer   Sit to stand assist level: 2 Helpers    Chair/bed transfer   Chair/bed transfer assist level: 2 Pension scheme manager transfer   Assist Level: 2 Helpers (stedy)     Care Tool Cognition  Expression of Ideas and Wants Expression of Ideas and Wants: 4. Without difficulty (complex and basic) - expresses complex messages without difficulty and with speech that is clear and easy to understand  Understanding Verbal and Non-Verbal Content Understanding Verbal and Non-Verbal Content: 3. Usually understands -  understands most conversations, but misses some part/intent of message. Requires cues at times to understand   Memory/Recall Ability Memory/Recall Ability : Current season;That he or she is in a hospital/hospital unit   Refer to Care Plan for [place] 1 OT Short Term Goal 1 (Week 1): Pt will completed sit > stand with Max A x1 in prep for functional ADL OT Short Term Goal 2 (Week 1): Pt will complete UBD with mod A OT Short Term Goal 3 (Week 1): Pt will complete LBD with max A  Recommendations for other services: None    Skilled Therapeutic Intervention Skilled OT intervention completed with focus on discussion of POC, purpose of  OT, and rehab goals. Pt received supine in bed, agreeable to session however reporting vomiting episode with PT just prior to OT entry. RN in room to remove peg tube for therapy. Pt reported need to have BM. Pt reported he's been going in the bed, vs bed pan due to back pain and reports inability to go to [facility]. Encouraged pt to attempt for easier toileting. Required mod A for bed mobility from supine > seated EOB. Reported dizziness and nausea, with 120/85 BP reading. Notified RN for nausea meds, RN in room to administer. Sit > stand Max A x2, then reported feeling like he's "going down." Re-attempted, with max x2 required for stand pivot transfer to [facility]. Pt very anxious and fearful during transfer. Able to maintain sitting balance on BSC with supervision. Used stedy with mod assist x2 required for sit > stand for hygiene. Required total A for toileting tasks and LBD. Pt encouraged to stay in w/c vs back to bed, but pt opted to return to bed. Used stedy to bring pt seated EOB, with mod A x2 needed for sit > stand off of stedy. Required mod A for EOB > semi-supine. Pt left in long sitting with peg tube still removed, head of bed slightly elevated, bed alarm on and all needs in reach at end of session.   ADL ADL Eating: Set up (peg tube) Where  Assessed-Eating: Bed level Grooming: Setup Where Assessed-Grooming: Bed level Upper Body Bathing: Moderate assistance Where Assessed-Upper Body Bathing: Edge of bed Lower Body Bathing: Maximal assistance Where Assessed-Lower Body Bathing: Edge of bed Upper Body Dressing: Minimal assistance Where Assessed-Upper Body Dressing: Edge of bed Lower Body Dressing: Maximal assistance Where Assessed-Lower Body Dressing: Edge of bed (stedy) Toileting: Maximal assistance (stedy) Where Assessed-Toileting: Bedside Commode Toilet Transfer: Maximal assistance Toilet Transfer Method: Other (comment) (stedy) Toilet Transfer Equipment: Bedside commode Tub/Shower Transfer: Unable to assess Tub/Shower Transfer Method: Unable to assess Social research officer, government: Unable to assess ADL Comments: Completed stand pivot with max x2 > BSC, however pt with rigidity, vestibular challenges, and benefited from [facility] transfer using stedy mod +2 Mobility  Bed Mobility Bed Mobility: Supine to Sit;Sitting - Scoot to Edge of Bed Rolling Right: Moderate Assistance - Patient 50-74% Supine to Sit: Moderate Assistance - Patient 50-74% Sitting - Scoot to Edge of Bed: Minimal Assistance - Patient > 75% Transfers Sit to Stand: 2 Helpers;Total Assistance - Patient < 25%   Discharge Criteria: Patient will be discharged from OT if patient refuses treatment 3 consecutive times without medical reason, if treatment goals not met, if there is a change in medical status, if patient makes no progress towards goals or if patient is discharged from hospital.  The above assessment, treatment plan, treatment alternatives and goals were discussed and mutually agreed upon: by patient  Blase Mess 10/11/2021, 10:33 AM

## 2021-10-11 NOTE — Evaluation (Addendum)
Physical Therapy Assessment and Plan  Patient Details  Name: Raymond Stuart MRN: [id number] Date of Birth: 31-Aug-1951  PT Diagnosis: Abnormal posture, Abnormality of gait, Coordination disorder, Hemiplegia non-dominant, Impaired cognition, Impaired sensation, and Muscle weakness Rehab Potential: Fair ELOS: 25-28 days   Today's Date: 10/11/2021 PT Individual Time: 805/-905    60 min   Hospital Problem: Principal Problem:   Sepsis due to undetermined organism [facility]) Active Problems:   Type 2 diabetes mellitus with hyperglycemia ([place])   Protein-calorie malnutrition, severe   Ischemic stroke ([place])   PEG (percutaneous endoscopic gastrostomy) status ([place])   Sepsis ([place])   Past Medical History:  Past Medical History:  Diagnosis Date   GERD (gastroesophageal reflux disease)    Hyperlipidemia    Joint pain    Kidney stone    Past Surgical History:  Past Surgical History:  Procedure Laterality Date   LITHOTRIPSY      Assessment & Plan Clinical Impression: Patient is a 70 year old right-handed male with history of GERD, hyperlipidemia, urolithiasis who was recently admitted to [facility] 9/26-9/29 and then at [facility] regional 9/30-11/1 after mechanical fall sustaining subdural hematoma traumatic intracranial hemorrhage ventricular hemorrhage in the setting of acute head injury with occipital fracture complicated by ischemic stroke follow-up aspiration pneumonia in the setting of stroke as well as PEG tube placement 40/98 complicated by AKI with urinary retention indwelling Foley catheter tube was placed and discharged to facility 11/1.  Per chart review prior to latest admission patient lives with spouse in [place].  1 level home.  Independent prior to initial hospital admission.  Presented 09/27/2021 to [facility] long hospital from skilled nursing facility after being found  covered in vomitus as well as fever one 1.2, tachypneic/dyspneic hypoxic in the 80s.  Responded to nonrebreather mask oxygen  10 L.  Chest x-ray showed low lung volumes without radiographic evidence of acute cardiopulmonary disease.  Admission chemistries glucose 161, BUN 27, WBC 15,300, urine culture greater 100,000 Klebsiella as well as Pseudomonas, blood cultures no growth to date, lactic acid 3.1.  Patient was placed on intravenous Zosyn suspect sepsis completing course 10/05/2021.  Hospital course bouts of diarrhea felt to be related to tube feeds.  His diet has been initiated dysphagia #1 thin liquids and remains on tube feeds for nutritional support.  Subcutaneous Lovenox for DVT prophylaxis.  He remains on low-dose aspirin for history of CVA.  Initial urinary retention Foley tube in place voiding trial Foley tube removed maintain on Flomax and failed voiding trial with reinsertion of Foley tube.  Monitoring of LFTs initially elevated hepatitis panel negative and monitored.  Persistent left hip and left knee pain x-ray showed no acute findings topical Lidoderm patch was placed. WOC follow-up for stage II pressure injury to the sacrum with wound care as directed.   Patient transferred to CIR on 10/10/2021 .   Patient currently requires total with mobility secondary to muscle weakness and muscle joint tightness, decreased cardiorespiratoy endurance, abnormal tone, unbalanced muscle activation, and decreased coordination, and decreased sitting balance, decreased standing balance, decreased postural control, hemiplegia, and decreased balance strategies.  Prior to hospitalization, patient was independent  with mobility and lived with Spouse, Daughter in a House home.  Home access is level entry  Level entry.  Patient will benefit from skilled PT intervention to maximize safe functional mobility, minimize fall risk, and decrease caregiver burden for planned discharge home with 24 hour assist.  Anticipate patient will benefit from follow up [facility] at discharge.  PT - End  of Session Activity Tolerance: Tolerates < 10 min activity with  changes in vital signs Endurance Deficit: Yes PT Assessment Rehab Potential (ACUTE/IP ONLY): Fair PT Barriers to Discharge: Home environment access/layout;Weight;Insurance for SNF coverage;Behavior PT Patient demonstrates impairments in the following area(s): Balance;Behavior;Edema;Endurance;Motor;Nutrition;Pain;Perception;Safety;Sensory PT Transfers Functional Problem(s): Bed Mobility;Bed to Chair;Car;Furniture PT Locomotion Functional Problem(s): Ambulation;Wheelchair Mobility;Stairs PT Plan PT Intensity: Minimum of 1-2 x/day ,45 to 90 minutes PT Frequency: 5 out of 7 days PT Duration Estimated Length of Stay: 25-28 days PT Treatment/Interventions: Ambulation/gait training;Community reintegration;DME/adaptive equipment instruction;Neuromuscular re-education;Psychosocial support;Stair training;UE/LE Strength taining/ROM;Wheelchair propulsion/positioning;Balance/vestibular training;Discharge planning;Functional electrical stimulation;Pain management;Skin care/wound management;Therapeutic Activities;UE/LE Coordination activities;Cognitive remediation/compensation;Disease management/prevention;Functional mobility training;Patient/family education;Splinting/orthotics;Therapeutic Exercise;Visual/perceptual remediation/compensation PT Transfers Anticipated Outcome(s): Min assist with LRAD PT Locomotion Anticipated Outcome(s): Mod assist ambulatory for short distances. supervision assist WC mobility PT Recommendation Recommendations for Other Services: Therapeutic Recreation consult Therapeutic Recreation Interventions: Stress management;Outing/community reintergration Follow Up Recommendations: Home health PT Patient destination: Home Equipment Recommended: Wheelchair (measurements);Wheelchair cushion (measurements);Rolling walker with 5" wheels;To be determined   PT Evaluation Precautions/Restrictions   Fall   Pain Pain Assessment Pain Scale: 0-10 Pain Score: 5  Pain Type: Acute pain Pain  Location: Buttocks Pain Orientation: Medial Pain Descriptors / Indicators: Aching Pain Onset: Gradual Patients Stated Pain Goal: 0 Pain Interference Pain Interference Pain Effect on Sleep: 3. Frequently Pain Interference with Therapy Activities: 2. Occasionally Pain Interference with Day-to-Day Activities: 4. Almost constantly Home Living/Prior Functioning Home Living Available Help at Discharge: Family;Available 24 hours/day Type of Home: House Home Access: Level entry Home Layout: One level Bathroom Shower/Tub: Multimedia programmer: Standard Bathroom Accessibility: Yes Additional Comments: lived with wife before accident and stroke. discharged to SNF for one day before admittance to [facility]. At baseline was independent with mobility and ADLs  Lives With: Spouse;Daughter Prior Function Level of Independence: Independent with basic ADLs;Independent with homemaking with ambulation;Independent with gait  Able to Take Stairs?: Yes Driving: Yes Vocation: Retired Vision/Perception  Vision - History Ability to See in Adequate Light: 0 Adequate Vision - Assessment Eye Alignment: Within Functional Limits Ocular Range of Motion: Within Functional Limits Tracking/Visual Pursuits: Able to track stimulus in all quads without difficulty Convergence: Within functional limits Perception Perception: Within Functional Limits Praxis Praxis: Impaired Praxis Impairment Details: Motor planning;Initiation   Sensation Sensation Light Touch: Appears Intact Hot/Cold: Appears Intact Coordination Gross Motor Movements are Fluid and Coordinated: No Fine Motor Movements are Fluid and Coordinated: No Finger Nose Finger Test: Increased time and decreased precision Motor  Motor Motor: Other (comment);Hemiplegia Motor - Skilled Clinical Observations: grossly uncoordinated due to pain, fatigue, weakness, and deconditioning   Trunk/Postural Assessment  Cervical Assessment Cervical  Assessment: Exceptions to [facility] (forward head) Thoracic Assessment Thoracic Assessment: Exceptions to [facility] (kyphosis) Lumbar Assessment Lumbar Assessment: Within Functional Limits Postural Control Postural Control: Deficits on evaluation  Balance Balance Balance Assessed: Yes Static Sitting Balance Static Sitting - Balance Support: Bilateral upper extremity supported;Feet supported Static Sitting - Level of Assistance: 3: Mod assist Static Sitting - Comment/# of Minutes: posterior bias. mild improvement with increased time in sitting to CGA Dynamic Sitting Balance Dynamic Sitting - Balance Support: No upper extremity supported Dynamic Sitting - Level of Assistance: 4: Min assist;3: Mod assist Extremity Assessment      RLE Assessment RLE Assessment: Exceptions to [facility] General Strength Comments: grossly 4-/5 to 4/5 LLE Assessment LLE Assessment: Exceptions to [facility] Passive Range of Motion (PROM) Comments: WFL supine in bed Active Range of Motion (AROM) Comments: limited knee extension sitting in EOB;  lackin ~ 15 deg from full extension . LLE Strength Left Hip Flexion: 3/5 Left Hip Extension: 3/5 Left Hip ABduction: 4-/5 Left Hip ADduction: 3+/5 Left Knee Flexion: 3-/5 Left Knee Extension: 3+/5 Left Ankle Dorsiflexion: 3+/5 Left Ankle Plantar Flexion: 3+/5  Care Tool Care Tool Bed Mobility Roll left and right activity   Roll left and right assist level: Moderate Assistance - Patient 50 - 74%    Sit to lying activity   Sit to lying assist level: Maximal Assistance - Patient 25 - 49%    Lying to sitting on side of bed activity   Lying to sitting on side of bed assist level: the ability to move from lying on the back to sitting on the side of the bed with no back support.: Maximal Assistance - Patient 25 - 49%     Care Tool Transfers Sit to stand transfer Sit to stand activity did not occur: Refused      Chair/bed transfer Chair/bed transfer activity did not occur:  Safety/medical concerns       Toilet transfer Toilet transfer activity did not occur: Safety/medical concerns      Scientist, product/process development transfer activity did not occur: Safety/medical concerns        Care Tool Locomotion Ambulation Ambulation activity did not occur: Safety/medical concerns        Walk 10 feet activity Walk 10 feet activity did not occur: Safety/medical concerns       Walk 50 feet with 2 turns activity Walk 50 feet with 2 turns activity did not occur: Safety/medical concerns      Walk 150 feet activity Walk 150 feet activity did not occur: Safety/medical concerns      Walk 10 feet on uneven surfaces activity Walk 10 feet on uneven surfaces activity did not occur: Safety/medical concerns      Stairs Stair activity did not occur: Safety/medical concerns        Walk up/down 1 step activity Walk up/down 1 step or curb (drop down) activity did not occur: Safety/medical concerns      Walk up/down 4 steps activity Walk up/down 4 steps activity did not occur: Safety/medical concerns      Walk up/down 12 steps activity Walk up/down 12 steps activity did not occur: Safety/medical concerns      Pick up small objects from floor Pick up small object from the floor (from standing position) activity did not occur: Safety/medical concerns      Wheelchair Is the patient using a wheelchair?: Yes Type of Wheelchair: Manual Wheelchair activity did not occur: Safety/medical concerns      Wheel 50 feet with 2 turns activity Wheelchair 50 feet with 2 turns activity did not occur: Safety/medical concerns    Wheel 150 feet activity Wheelchair 150 feet activity did not occur: Safety/medical concerns      Refer to Care Plan for Long Term Goals  SHORT TERM GOAL WEEK 1 PT Short Term Goal 1 (Week 1): Pt will tolerate sitting in WC >2 hours between therapies PT Short Term Goal 2 (Week 1): Pt will perform bed mobility with mod assist consistently PT Short Term Goal 3 (Week 1):  Pt will transfer to and from [facility] with max assist of 1. PT Short Term Goal 4 (Week 1): Pt will tolerate standing in stedy x 2 minutes with mod assist  Recommendations for other services: Neuropsych and Therapeutic Recreation  Stress management  Skilled Therapeutic Intervention Mobility Bed Mobility Bed Mobility: Supine to Sit;Sitting - Scoot to  Edge of Bed Rolling Right: Moderate Assistance - Patient 50-74% Supine to Sit: Maximal Assistance - Patient - Patient 25-49% Sitting - Scoot to Edge of Bed: Maximal Assistance - Patient 25-49% Transfers Transfers: Sit to Comptroller Details (indicate cue type and reason): unable to perform at eval due to nausea and need for BM Transfer via Lift Equipment: Probation officer Ambulation: No Gait Gait: No Stairs / Additional Locomotion Stairs: No Wheelchair Mobility Wheelchair Mobility: No   Pt received supine in bed and agreeable to PT. PT instructed patient in PT Evaluation and initiated treatment intervention; see above for results. PT educated patient in [place], rehab potential, rehab goals, and discharge recommendations along with recommendation for follow-up rehabilitation services. PT assisted pd to don TEDs supine in bed with total A Supine>sit transfer with max A assist and max cues for improved positioning o thte LLE. Orthostatic VS. 128/76. Supine. Sitting 131/79. Sitting 2 min 127/76. Pt reports dizziness in sitting then nausea resulting in emesis. After 5 minutes sitting EOB. Pt reprots need for BM, but due to fatigue, is in able to performed transfer to [facility]. Lateral scoot EOB with max-total A* to the L. Sit>Supine with max assist at trunk and BLE. Scooting to [facility] with total A. NT aware of pt needs.     Discharge Criteria: Patient will be discharged from PT if patient refuses treatment 3 consecutive times without medical reason, if treatment goals not met, if there is a  change in medical status, if patient makes no progress towards goals or if patient is discharged from hospital.  The above assessment, treatment plan, treatment alternatives and goals were discussed and mutually agreed upon: by patient  Lorie Phenix 10/11/2021, 11:32 PM

## 2021-10-11 NOTE — Plan of Care (Signed)
  Problem: RH Balance Goal: LTG Patient will maintain dynamic sitting balance (PT) Description: LTG:  Patient will maintain dynamic sitting balance with assistance during mobility activities (PT) Flowsheets (Taken 10/11/2021 1747) LTG: Pt will maintain dynamic sitting balance during mobility activities with:: Independent with assistive device  Goal: LTG Patient will maintain dynamic standing balance (PT) Description: LTG:  Patient will maintain dynamic standing balance with assistance during mobility activities (PT) Flowsheets (Taken 10/11/2021 1747) LTG: Pt will maintain dynamic standing balance during mobility activities with:: Minimal Assistance - Patient > 75%   Problem: RH Bed Mobility Goal: LTG Patient will perform bed mobility with assist (PT) Description: LTG: Patient will perform bed mobility with assistance, with/without cues (PT). Flowsheets (Taken 10/11/2021 1747) LTG: Pt will perform bed mobility with assistance level of: Supervision/Verbal cueing   Problem: RH Bed to Chair Transfers Goal: LTG Patient will perform bed/chair transfers w/assist (PT) Description: LTG: Patient will perform bed to chair transfers with assistance (PT). Flowsheets (Taken 10/11/2021 1747) LTG: Pt will perform Bed to Chair Transfers with assistance level: Minimal Assistance - Patient > 75%   Problem: RH Car Transfers Goal: LTG Patient will perform car transfers with assist (PT) Description: LTG: Patient will perform car transfers with assistance (PT). Flowsheets (Taken 10/11/2021 1747) LTG: Pt will perform car transfers with assist:: Moderate Assistance - Patient 50 - 74%   Problem: RH Ambulation Goal: LTG Patient will ambulate in controlled environment (PT) Description: LTG: Patient will ambulate in a controlled environment, # of feet with assistance (PT). Flowsheets (Taken 10/11/2021 1747) LTG: Pt will ambulate in controlled environ  assist needed:: Moderate Assistance - Patient 50 - 74% LTG:  Ambulation distance in controlled environment: 40ft with LRAD   Problem: RH Wheelchair Mobility Goal: LTG Patient will propel w/c in controlled environment (PT) Description: LTG: Patient will propel wheelchair in controlled environment, # of feet with assist (PT) Flowsheets (Taken 10/11/2021 1747) LTG: Pt will propel w/c in controlled environ  assist needed:: Supervision/Verbal cueing LTG: Propel w/c distance in controlled environment: 181ft Goal: LTG Patient will propel w/c in home environment (PT) Description: LTG: Patient will propel wheelchair in home environment, # of feet with assistance (PT). Flowsheets (Taken 10/11/2021 1747) LTG: Pt will propel w/c in home environ  assist needed:: Supervision/Verbal cueing Distance: wheelchair distance in controlled environment: 50

## 2021-10-11 NOTE — Progress Notes (Signed)
Inpatient Rehabilitation  Patient information reviewed and entered into eRehab system by Nhi Butrum Sylwia Cuervo, OTR/L.   Information including medical coding, functional ability and quality indicators will be reviewed and updated through discharge.    

## 2021-10-12 LAB — GLUCOSE, CAPILLARY
Glucose-Capillary: 107 mg/dL — ABNORMAL HIGH (ref 70–99)
Glucose-Capillary: 115 mg/dL — ABNORMAL HIGH (ref 70–99)
Glucose-Capillary: 118 mg/dL — ABNORMAL HIGH (ref 70–99)
Glucose-Capillary: 122 mg/dL — ABNORMAL HIGH (ref 70–99)
Glucose-Capillary: 146 mg/dL — ABNORMAL HIGH (ref 70–99)
Glucose-Capillary: 147 mg/dL — ABNORMAL HIGH (ref 70–99)

## 2021-10-12 MED ORDER — JEVITY 1.2 CAL PO LIQD
1000.0000 mL | ORAL | Status: DC
  Administered 2021-10-12 – 2021-10-16 (×5): 1000 mL
  Filled 2021-10-12 (×10): qty 1000

## 2021-10-12 NOTE — Progress Notes (Signed)
Inpatient Rehabilitation Center Individual Statement of Services  Patient Name:  Staton Markey  Date:  10/12/2021  Welcome to the Inpatient Rehabilitation Center.  Our goal is to provide you with an individualized program based on your diagnosis and situation, designed to meet your specific needs.  With this comprehensive rehabilitation program, you will be expected to participate in at least 3 hours of rehabilitation therapies Monday-Friday, with modified therapy programming on the weekends.  Your rehabilitation program will include the following services:  Physical Therapy (PT), Occupational Therapy (OT), Speech Therapy (ST), 24 hour per day rehabilitation nursing, Therapeutic Recreaction (TR), Neuropsychology, Care Coordinator, Rehabilitation Medicine, Nutrition Services, Pharmacy Services, and Other  Weekly team conferences will be held on Wednesdays to discuss your progress.  Your Inpatient Rehabilitation Care Coordinator will talk with you frequently to get your input and to update you on team discussions.  Team conferences with you and your family in attendance may also be held.  Expected length of stay:  24-26 Days  Overall anticipated outcome:  Supervision  Depending on your progress and recovery, your program may change. Your Inpatient Rehabilitation Care Coordinator will coordinate services and will keep you informed of any changes. Your Inpatient Rehabilitation Care Coordinator's name and contact numbers are listed  below.  The following services may also be recommended but are not provided by the Inpatient Rehabilitation Center:   Home Health Rehabiltiation Services Outpatient Rehabilitation Services    Arrangements will be made to provide these services after discharge if needed.  Arrangements include referral to agencies that provide these services.  Your insurance has been verified to be:  [facility] Medicare Your primary doctor is:  Caffie Damme, MD  Pertinent information will be  shared with your doctor and your insurance company.  Inpatient Rehabilitation Care Coordinator:  Lavera Guise, [place] [phone] or [phone]  Information discussed with and copy given to patient by: Andria Rhein, 10/12/2021, 10:51 AM

## 2021-10-12 NOTE — Progress Notes (Signed)
Inpatient Rehabilitation Care Coordinator Assessment and Plan Patient Details  Name: Raymond Stuart MRN: [id number] Date of Birth: 1951-05-15  Today's Date: 10/12/2021  Hospital Problems: Principal Problem:   Sepsis due to undetermined organism [facility]) Active Problems:   Type 2 diabetes mellitus with hyperglycemia ([place])   Protein-calorie malnutrition, severe   Ischemic stroke ([place])   PEG (percutaneous endoscopic gastrostomy) status ([place])   Sepsis ([place])  Past Medical History:  Past Medical History:  Diagnosis Date   GERD (gastroesophageal reflux disease)    Hyperlipidemia    Joint pain    Kidney stone    Past Surgical History:  Past Surgical History:  Procedure Laterality Date   LITHOTRIPSY     Social History:  reports that he has never smoked. He has never used smokeless tobacco. He reports that he does not drink alcohol. No history on file for drug use.  Family / Support Systems Spouse/Significant Other: [place] Anticipated Caregiver: Izora Gala (spouse) Ability/Limitations of Caregiver: Min A Caregiver Availability: 24/7 Family Dynamics: supoort from spouse and children  Social History Preferred language: English Religion: None Health Literacy - How often do you need to have someone help you when you read instructions, pamphlets, or other written material from your doctor or pharmacy?: Patient declines to respond Writes: Yes Legal History/Current Legal Issues: n/a Guardian/Conservator: n/a   Abuse/Neglect Abuse/Neglect Assessment Can Be Completed: Unable to assess, patient is non-responsive or altered mental status Physical Abuse: Denies Verbal Abuse: Denies Sexual Abuse: Denies Exploitation of patient/patient's resources: Denies Self-Neglect: Denies  Patient response to: Social Isolation - How often do you feel lonely or isolated from those around you?: Patient unable to respond  Emotional Status Recent Psychosocial Issues: n/a Psychiatric History:  n/a Substance Abuse History: n/a  Patient / Family Perceptions, Expectations & Goals Pt/Family understanding of illness & functional limitations: yes Premorbid pt/family roles/activities: previously driving and out of the house daily Anticipated changes in roles/activities/participation: spouse can provide some assistance Pt/family expectations/goals: supervision  [place] Agencies: None Premorbid Home Care/DME Agencies: Other (Comment) Librarian, academic, Civil engineer, contracting, HB) Transportation available at discharge: spouse Is the patient able to respond to transportation needs?: No Resource referrals recommended: Neuropsychology  Discharge Planning Living Arrangements: Spouse/significant other, Children Support Systems: Spouse/significant other Type of Residence: Private residence Insurance Resources: Multimedia programmer (specify) Sports administrator) Financial Resources: Family Support Financial Screen Referred: No Living Expenses: Lives with family Money Management: Patient, Spouse Does the patient have any problems obtaining your medications?: No Home Management: Independent Patient/Family Preliminary Plans: spouse able to assist with money and medication management Care Coordinator Barriers to Discharge: Insurance for SNF coverage, Decreased caregiver support, Lack of/limited family support DC Planning Additional Notes/Comments: Pt lives with spouse, d/c to SNF for 1-day then admitted to [facility] Expected length of stay: 24-26 Days  Clinical Impression SW met with patient, introduced self and explained role. SW left contact information in pt room for pt family. SW will follow up, no additional questions or concerns.   Dyanne Iha 10/12/2021, 12:43 PM

## 2021-10-12 NOTE — Progress Notes (Signed)
   10/12/21 1030  Clinical Encounter Type  Visited With Patient  Visit Type Spiritual support;Initial  Referral From Nurse  Consult/Referral To Chaplain   Chaplain Tery Sanfilippo responded to the consult request for prayer. Chaplain encouraged the patient to explore his feelings. The patient said he is concern about his health. Donnajean Lopes ended the visit with prayer.This note was prepared by Deneen Harts, M.Div..  For questions please contact by phone [phone].

## 2021-10-12 NOTE — Progress Notes (Signed)
Physical Therapy Session Note  Patient Details  Name: Raymond Stuart MRN: [id number] Date of Birth: 18-Jul-1951  Today's Date: 10/12/2021 PT Individual Time: 4098-1191 PT Individual Time Calculation (min): 15 min  and Today's Date: 10/12/2021 PT Missed Time: 15 Minutes Missed Time Reason: Patient ill (Comment);Patient unwilling to participate (Pt nauseous and politely declined PT)  Short Term Goals: Week 1:  PT Short Term Goal 1 (Week 1): Pt will tolerate sitting in WC >2 hours between therapies PT Short Term Goal 2 (Week 1): Pt will perform bed mobility with mod assist consistently PT Short Term Goal 3 (Week 1): Pt will transfer to and from [facility] with max assist of 1. PT Short Term Goal 4 (Week 1): Pt will tolerate standing in stedy x 2 minutes with mod assist  Skilled Therapeutic Interventions/Progress Updates:  Pt received supine in bed, receiving tube feeding from nursing. Pt reported 4/10 pain in buttocks and head, was receiving medication at beginning of session. Offered modalities and distraction for pain modulation. Pt performed supine <>sit EOB w/min A for trunk support, immediately became nauseous and requested to lay back down. Pt politely declined therapy due to continued nausea and was left supine in bed, head elevated, all needs in reach. Nursing notified. Missed 15 minutes of skilled PT due to pt refusal and nausea.   Therapy Documentation Precautions:  Precautions Precautions: Fall Precaution Comments: g-tube, orthostatic, left hip and knee pain Restrictions Weight Bearing Restrictions: No   Therapy/Group: Individual Therapy Jill Alexanders Ragnar Waas, PT, DPT  10/12/2021, 7:57 AM

## 2021-10-12 NOTE — Progress Notes (Signed)
Inpatient Rehabilitation Care Coordinator Assessment and Plan Patient Details  Name: Raymond Stuart MRN: [id number] Date of Birth: May 19, 1951  Today's Date: 10/12/2021  Hospital Problems: Principal Problem:   Sepsis due to undetermined organism [facility]) Active Problems:   Type 2 diabetes mellitus with hyperglycemia (HCC)   Protein-calorie malnutrition, severe   Ischemic stroke (HCC)   PEG (percutaneous endoscopic gastrostomy) status (HCC)   Sepsis (HCC)  Past Medical History:  Past Medical History:  Diagnosis Date   GERD (gastroesophageal reflux disease)    Hyperlipidemia    Joint pain    Kidney stone    Past Surgical History:  Past Surgical History:  Procedure Laterality Date   LITHOTRIPSY     Social History:  reports that he has never smoked. He has never used smokeless tobacco. He reports that he does not drink alcohol. No history on file for drug use.  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 [place] Anticipated Caregiver: [name] (spouse) Ability/Limitations of Caregiver: Min A Caregiver Availability: 24/7 Family Dynamics: supoort from spouse and children  Social History Preferred language: English Religion: None Health Literacy - How often do you need to have someone help you when you read instructions, pamphlets, or other written material from your doctor or pharmacy?: Patient declines to respond Writes: Yes Legal History/Current Legal Issues: n/a Guardian/Conservator: n/a   Abuse/Neglect Abuse/Neglect Assessment Can Be Completed: Unable to assess, patient is non-responsive or altered mental status Physical Abuse: Denies Verbal Abuse: Denies Sexual Abuse: Denies Exploitation of patient/patient's resources: Denies Self-Neglect: Denies  Patient response to: Social Isolation - How often do you feel lonely or isolated from those around you?: Patient unable to respond  Emotional Status Recent Psychosocial Issues: n/a Psychiatric History:  n/a Substance Abuse History: n/a  Patient / Family Perceptions, Expectations & Goals Pt/Family understanding of illness & functional limitations: yes Premorbid pt/family roles/activities: previously driving and out of the house daily Anticipated changes in roles/activities/participation: spouse can provide some assistance Pt/family expectations/goals: supervision  [employer] Agencies: None Premorbid Home Care/DME Agencies: Other (Comment) Adult nurse, Information systems manager, HB) Transportation available at discharge: spouse Is the patient able to respond to transportation needs?: No Resource referrals recommended: Neuropsychology  Discharge Planning Living Arrangements: Spouse/significant other, Children Support Systems: Spouse/significant other Type of Residence: Private residence Insurance Resources: Media planner (specify) Education officer, museum) Financial Resources: Family Support Financial Screen Referred: No Living Expenses: Lives with family Money Management: Patient, Spouse Does the patient have any problems obtaining your medications?: No Home Management: Independent Patient/Family Preliminary Plans: spouse able to assist with money and medication management Care Coordinator Barriers to Discharge: Insurance for SNF coverage, Decreased caregiver support, Lack of/limited family support DC Planning Additional Notes/Comments: Pt lives with spouse, d/c to SNF for 1-day then admitted to [facility] Expected length of stay: 24-26 Days  Clinical Impression Sw made attempt to see pt on 11/16. SW will follow up today  Andria Rhein 10/12/2021, 10:52 AM

## 2021-10-12 NOTE — Progress Notes (Addendum)
Occupational Therapy Session Note  Patient Details  Name: Raymond Stuart MRN: [id number] Date of Birth: 09/17/1951  Today's Date: 10/12/2021 OT Individual Time: 9629-5284 OT Individual Time Calculation (min): 60 min    Short Term Goals: Week 1:  OT Short Term Goal 1 (Week 1): Pt will completed sit > stand with Max A x1 in prep for functional ADL OT Short Term Goal 2 (Week 1): Pt will complete UBD with mod A OT Short Term Goal 3 (Week 1): Pt will complete LBD with max A Week 2:    Week 3:     Skilled Therapeutic Interventions/Progress Updates:    Upon arrival, I observed that the pt presented with facial grimace and asked if he was okay.  The pt indicated that he wasn't feeling well and that he was having challenges when trying to sit EOB.  The pt indicated that he would get dizzy with changes in his position  and had upchucked earlier as a result.  The pt had a BP OF 134/82 and O2 saturation of 90%.  The pt was open to completing UB theraex at bed level with his head elevated.  The pt was able to assist with repositioning up in the bed while in supine, incorporating bilateral UE and his feet to reposition with MinA. The pt completed a simulation task in dressing UB using medium grade theraband with minimal vc's for bilateral coordination during task execution. The the pt complete UB theraex using a 3lb dumb bell 1 set of 10 for bicep curl, horizontal abduction , and shld flexion below the level of pain with 2 rest breaks and initial for performance.  The pt went on to complete modified situp using the down for coming from lying in supine to long sitting for 4 times for a count of  to to improve his UB strength and activity tolerance.  The pt was comfortable at the end of the session with no c/o pain. The pt's bedside table ,telephone , and call light was within reach.  The pt had a BP of 137/85 with O2 saturation of 91%  Therapy Documentation Precautions:  Precautions Precautions:  Fall Precaution Comments: g-tube, orthostatic, left hip and knee pain Restrictions Weight Bearing Restrictions: No General:   Vital Signs: Therapy Vitals Temp: 98 F (36.7 C) Temp Source: Oral Pulse Rate: 92 Resp: 18 BP: 125/87 Patient Position (if appropriate): Lying Oxygen Therapy SpO2: 93 % O2 Device: Room Air Pain:   Vision   Perception    Praxis   Exercises:   Other Treatments:     Therapy/Group: Individual Therapy  Lavona Mound 10/12/2021, 2:01 PM
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
## 2021-10-12 NOTE — Progress Notes (Signed)
Pt noted with n/v this am with therapy. Stopped tube feeding until noon to allow pt to work with therapy. Therapy saw pt after lunch and again felt nauseous. Called MD will see if changing tube feeding for 12 hours from 6p-6a and also may improve appetite. Family in agreement. Administered PRN Zofran with good results.
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
## 2021-10-12 NOTE — Patient Care Conference (Signed)
Inpatient RehabilitationTeam Conference and Plan of Care Update Date: 10/12/2021   Time: 11:47 AM    Patient Name: Raymond Stuart      Medical Record Number: [id number]  Date of Birth: 1951/07/09 Sex: Male         Room/Bed: 5C04C/5C04C-01 Payor Info: Payor: Multimedia programmer / Plan: [employer]HC MEDICARE / Product Type: *No Product type* /    Admit Date/Time:  10/10/2021  1:52 PM  Primary Diagnosis:  Sepsis due to undetermined organism [facility])  Hospital Problems: Principal Problem:   Sepsis due to undetermined organism (HCC) Active Problems:   Type 2 diabetes mellitus with hyperglycemia (HCC)   Protein-calorie malnutrition, severe   Ischemic stroke (HCC)   PEG (percutaneous endoscopic gastrostomy) status (HCC)   Sepsis (HCC)    Expected Discharge Date: Expected Discharge Date:  (ELOS 3 weeks)  Team Members Present: Physician leading conference: Dr. Sula Soda Social Worker Present: Lavera Guise, BSW Nurse Present: Chana Bode, RN PT Present: Raechel Chute, PT OT Present: Other (comment) [facility] Cheyenne Adas, OT) SLP Present: Gerda Diss, SLP PPS Coordinator present : Edson Snowball, PT     Current Status/Progress Goal Weekly Team Focus  Bowel/Bladder   indwelling foley in place. Continent of bowel per report. LBM per family 11/14  Remain continent of bowel. Possible removal of foley.  Toilet PRN, CHG bath and foley care.   Swallow/Nutrition/ Hydration             ADL's   Max A bathing, Max A UBD, Total A LBD, Total toileting, sit > stand with mod x2 in stedy  min-mod A  ADL retraining, endurance, functional transfers   Mobility             Communication             Safety/Cognition/ Behavioral Observations            Pain   no c/o pain at this time  pain <3/10  Assess Qshift and prn   Skin   MASD and 2 small spots in crease of buttocks. Thrush in mouth. Peg tube on left mid abdomen.  Maintain skin as dry as possible to decrease MASD.  Continue to apply  gerhardts butt cream.     Discharge Planning:  assesment pending   Team Discussion: Patient with debility, post occipital fracture, CVA. Noted vestibular issues; vomited after coming to edge of bed, increased anxiety with movement,and reported fear of falling. Patient has foley after failed voiding trial and diarrhea (from tube feeding) and skin issues (stage 2 w MASD to sacral area likely from diarrhea). Patient on target to meet rehab goals: Currently Max assist overall and total assist for toileting.   *See Care Plan and progress notes for long and short-term goals.   Revisions to Treatment Plan:  Evals pending at the time of conference D2 thin diet; poor appetite with continuous tube feeding via PEG   Teaching Needs: Pending progression; nutritional means/PEG care, tube feeding administration, medication administration, foley care/pericare, skin care, safety, transfers, etc.  Current Barriers to Discharge: Decreased caregiver support, Home enviroment access/layout, Nutritional means, and and urinary retention  Possible Resolutions to Barriers: Family education     Medical Summary Current Status: debility following sepsis, urinary retention, impaired cognition and swallowing, diarrhea, thrush, knee pain, HLD  Barriers to Discharge: Medical stability  Barriers to Discharge Comments: debility following sepsis, urinary retention, impaired cognition and swallowing, diarrhea, thrush, knee pain, HLD Possible Resolutions to [employer] Focus: start bethanacol, continue foley, add fiber,  started diflucan, continue lidoderm patch, continue statin   Continued Need for Acute Rehabilitation Level of Care: The patient requires daily medical management by a physician with specialized training in physical medicine and rehabilitation for the following reasons: Direction of a multidisciplinary physical rehabilitation program to maximize functional independence : Yes Medical management of  patient stability for increased activity during participation in an intensive rehabilitation regime.: Yes Analysis of laboratory values and/or radiology reports with any subsequent need for medication adjustment and/or medical intervention. : Yes   I attest that I was present, lead the team conference, and concur with the assessment and plan of the team.   Chana Bode B 10/12/2021, 8:34 AM

## 2021-10-13 LAB — GLUCOSE, CAPILLARY
Glucose-Capillary: 104 mg/dL — ABNORMAL HIGH (ref 70–99)
Glucose-Capillary: 106 mg/dL — ABNORMAL HIGH (ref 70–99)
Glucose-Capillary: 108 mg/dL — ABNORMAL HIGH (ref 70–99)
Glucose-Capillary: 114 mg/dL — ABNORMAL HIGH (ref 70–99)
Glucose-Capillary: 134 mg/dL — ABNORMAL HIGH (ref 70–99)
Glucose-Capillary: 162 mg/dL — ABNORMAL HIGH (ref 70–99)

## 2021-10-13 MED ORDER — LIDOCAINE HCL URETHRAL/MUCOSAL 2 % EX GEL
1.0000 "application " | CUTANEOUS | Status: DC | PRN
  Administered 2021-10-13 – 2021-10-17 (×8): 1 via URETHRAL
  Filled 2021-10-13 (×11): qty 6

## 2021-10-13 MED ORDER — MECLIZINE HCL 25 MG PO TABS
12.5000 mg | ORAL_TABLET | Freq: Three times a day (TID) | ORAL | Status: DC
  Administered 2021-10-13 – 2021-11-02 (×62): 12.5 mg via ORAL
  Filled 2021-10-13 (×62): qty 1

## 2021-10-13 MED ORDER — PROSOURCE TF PO LIQD
45.0000 mL | Freq: Two times a day (BID) | ORAL | Status: DC
  Administered 2021-10-13 – 2021-10-18 (×10): 45 mL
  Filled 2021-10-13 (×10): qty 45

## 2021-10-13 NOTE — Progress Notes (Signed)
Occupational Therapy Session Note  Patient Details  Name: Raymond Stuart MRN: [id number] Date of Birth: 1950-12-01  Today's Date: 10/13/2021 OT Individual Time: 0240-9735 OT Individual Time Calculation (min): 54 min    Short Term Goals: Week 1:  OT Short Term Goal 1 (Week 1): Pt will completed sit > stand with Max A x1 in prep for functional ADL OT Short Term Goal 2 (Week 1): Pt will complete UBD with mod A OT Short Term Goal 3 (Week 1): Pt will complete LBD with max A  Skilled Therapeutic Interventions/Progress Updates:  Skilled OT intervention completed with focus on ADL retraining. Pt received supine in bed, agreeable to session. Completed bed mobility with HOB elevated, to seated EOB with min A for BLE management. Doffed shirt with supervision. Pt sit > stand in stedy with max A, and cues required for positioning and to bring chest up for full clearing of bottom for seat rest. Positioned near TTB, for shower transfer, pt able to sit > stand in stedy with Max A, with total A required for doffing pants and briefs. Required mod A for descent onto TTB. Able to lift R leg <> shower, required assist and cues to bring LLE <> shower. Completed bathing with min A this session for both lower legs and buttocks. Pt report of nausea, with episode of vomiting. Feeling of nausea improved after episode. RN aware, plan to provide ginger ale and meds for nausea after shower. Pt report of feeling the dizziness mostly when in the stedy and rolling. Provided education on using a focal point during movement in stedy/rolling to ease vestibular upset due to suspected vestibular imbalances. Pt reported using focal point, improved his nausea episodes with rest of session. Able to donn shirt with supervision, briefs with total A for time. Declined wearing pants. Completed sit > stand in stedy Max A, then mod A for descent to seated EOB. Completed bed mobility with min A for BLE management. Pt left semi-supine, with peg tube  off, with bed alarm on, NT in room and all needs in reach at end of session.  Therapy Documentation Precautions:  Precautions Precautions: Fall Precaution Comments: g-tube, orthostatic, left hip and knee pain Restrictions Weight Bearing Restrictions: No  Pain: No c/o pain    Therapy/Group: Individual Therapy  Madelein Mahadeo E Esdras Delair 10/13/2021, 7:53 AM

## 2021-10-13 NOTE — Progress Notes (Signed)
PROGRESS NOTE   Subjective/Complaints: He has been able to void since foley removal but still requiring cath due to retention.  Discussed with patient and SLP decreasing TF daily to stimulate appetite for regular food.   ROS: diarrhea improving/resolved, +dizziness with therapy   Objective:   VAS [place] LOWER EXTREMITY VENOUS (DVT)  Result Date: 10/11/2021  Lower Venous DVT Study Patient Name:  Raymond Stuart  Date of Exam:   10/11/2021 Medical Rec #: [id number]    Accession #:    [id number] Date of Birth: [birth year]   Patient Gender: M Patient Age:   70 years Exam Location:  [facility] Procedure:      VAS [place] LOWER EXTREMITY VENOUS (DVT) Referring Phys: Mariam Dollar --------------------------------------------------------------------------------  Indications: Edema.  Comparison Study: No prior study Performing Technologist: Gertie Fey MHA, RDMS, RVT, RDCS  Examination Guidelines: A complete evaluation includes B-mode imaging, spectral Doppler, color Doppler, and power Doppler as needed of all accessible portions of each vessel. Bilateral testing is considered an integral part of a complete examination. Limited examinations for reoccurring indications may be performed as noted. The reflux portion of the exam is performed with the patient in reverse Trendelenburg.  +---------+---------------+---------+-----------+----------+--------------+ RIGHT    CompressibilityPhasicitySpontaneityPropertiesThrombus Aging +---------+---------------+---------+-----------+----------+--------------+ CFV      Full           Yes      Yes                                 +---------+---------------+---------+-----------+----------+--------------+ SFJ      Full                                                        +---------+---------------+---------+-----------+----------+--------------+ FV Prox  Full                                                         +---------+---------------+---------+-----------+----------+--------------+ FV Mid   Full                                                        +---------+---------------+---------+-----------+----------+--------------+ FV DistalFull                                                        +---------+---------------+---------+-----------+----------+--------------+ PFV      Full                                                        +---------+---------------+---------+-----------+----------+--------------+  POP      Full           Yes      Yes                                 +---------+---------------+---------+-----------+----------+--------------+ PTV      Full                                                        +---------+---------------+---------+-----------+----------+--------------+ PERO     Full                                                        +---------+---------------+---------+-----------+----------+--------------+   +---------+---------------+---------+-----------+----------+--------------+ LEFT     CompressibilityPhasicitySpontaneityPropertiesThrombus Aging +---------+---------------+---------+-----------+----------+--------------+ CFV      Full           Yes      Yes                                 +---------+---------------+---------+-----------+----------+--------------+ SFJ      Full                                                        +---------+---------------+---------+-----------+----------+--------------+ FV Prox  None                    No                   Acute          +---------+---------------+---------+-----------+----------+--------------+ FV Mid   None                    No                   Acute          +---------+---------------+---------+-----------+----------+--------------+ FV DistalFull                                                         +---------+---------------+---------+-----------+----------+--------------+ PFV      None                    No                   Acute          +---------+---------------+---------+-----------+----------+--------------+ POP      Full           Yes      Yes                                 +---------+---------------+---------+-----------+----------+--------------+ PTV      Full                                                        +---------+---------------+---------+-----------+----------+--------------+  PERO     Full                                                        +---------+---------------+---------+-----------+----------+--------------+     Summary: RIGHT: - There is no evidence of deep vein thrombosis in the lower extremity.  - No cystic structure found in the popliteal fossa.  LEFT: - Findings consistent with acute deep vein thrombosis involving the left proximal and mid femoral vein, and left proximal profunda vein.  *See table(s) above for measurements and observations. Electronically signed by Coral Else MD on 10/11/2021 at 8:52:26 PM.    Final    Recent Labs    10/10/21 2002 10/11/21 0631  WBC 9.3 8.7  HGB 12.5* 12.4*  HCT 39.3 39.6  PLT 352 371   Recent Labs    10/10/21 2002 10/11/21 0631  NA  --  133*  K  --  4.0  CL  --  101  CO2  --  23  GLUCOSE  --  114*  BUN  --  25*  CREATININE 0.62 0.60*  CALCIUM  --  8.7*    Intake/Output Summary (Last 24 hours) at 10/13/2021 0902 Last data filed at 10/13/2021 0030 Gross per 24 hour  Intake --  Output 1000 ml  Net -1000 ml     Pressure Injury Buttocks Left;Anterior;Medial Deep Tissue Pressure Injury - Purple or maroon localized area of discolored intact skin or blood-filled blister due to damage of underlying soft tissue from pressure and/or shear. 1.5X1 inner buttock beside s (Active)     Location: Buttocks  Location Orientation: Left;Anterior;Medial  Staging: Deep Tissue Pressure Injury -  Purple or maroon localized area of discolored intact skin or blood-filled blister due to damage of underlying soft tissue from pressure and/or shear.  Wound Description (Comments): 1.5X1 inner buttock beside stage 2  Present on Admission: Yes    Physical Exam: Vital Signs Blood pressure 134/84, pulse 93, temperature 98.1 F (36.7 C), temperature source Oral, resp. rate 17, height 6' (1.829 m), weight 75.2 kg, SpO2 94 %. Gen: no distress, normal appearing HEENT: oral mucosa pink and moist, NCAT Cardio: Reg rate, +orthostasis Pulmonary:     Comments: Decreased at bases B/L- good air movement in apex B/L- no W/R/R Abdominal:     Comments: Gastrostomy tube in place.- looks OK Soft, NT, ND, hyperactive BS  Genitourinary:    Comments: Foley in place- medium amber urine in bag Musculoskeletal:     Cervical back: Normal range of motion. No rigidity.     Comments: RUE 4+/5 deltoid- 5-/5 otherwise LUE- deltoid 4/5; otherwise 4+/5 RLE- 5-/5 LLE- HF 2-/5; KE 2/5; DF 4-/5, PF 4-/5   Skin:    Comments: IV R forearm- looks OK MASD 2 small spots in crease of buttocks B/L  Thrush in mouth- significant- red tongue/beefy  Neurological:     Comments: Patient is alert and interactive but did not initiate conversation.  Makes eye contact with examiner.  Provides his name and age but slow to process.  Follows simple commands.  Limited medical historian. Slowed processing; intact to light touch in all 4 extremities No conus or hoffman's seen  Psychiatric:     Comments: Flat affect. Much more interactive. Brightens when discussing his granddaughter   Assessment/Plan: 1. Functional deficits which require 3+ hours  per day of interdisciplinary therapy in a comprehensive inpatient rehab setting. Physiatrist is providing close team supervision and 24 hour management of active medical problems listed below. Physiatrist and rehab team continue to assess barriers to discharge/monitor patient progress toward  functional and medical goals  Care Tool:  Bathing  Bathing activity did not occur: Safety/medical concerns Body parts bathed by patient: Right arm, Left arm, Chest, Abdomen, Right upper leg, Left upper leg, Face   Body parts bathed by helper: Front perineal area, Buttocks, Right lower leg, Left lower leg     Bathing assist Assist Level: Maximal Assistance - Patient 24 - 49%     Upper Body Dressing/Undressing Upper body dressing Upper body dressing/undressing activity did not occur (including orthotics): Safety/medical concerns What is the patient wearing?: Pull over shirt    Upper body assist Assist Level: Maximal Assistance - Patient 25 - 49%    Lower Body Dressing/Undressing Lower body dressing    Lower body dressing activity did not occur: Safety/medical concerns What is the patient wearing?: Underwear/pull up, Pants     Lower body assist Assist for lower body dressing: Total Assistance - Patient < 25%     Toileting Toileting    Toileting assist Assist for toileting: Total Assistance - Patient < 25%     Transfers Chair/bed transfer  Transfers assist  Chair/bed transfer activity did not occur: Safety/medical concerns  Chair/bed transfer assist level: Dependent - mechanical lift (stedy)     Locomotion Ambulation   Ambulation assist   Ambulation activity did not occur: Safety/medical concerns          Walk 10 feet activity   Assist  Walk 10 feet activity did not occur: Safety/medical concerns        Walk 50 feet activity   Assist Walk 50 feet with 2 turns activity did not occur: Safety/medical concerns         Walk 150 feet activity   Assist Walk 150 feet activity did not occur: Safety/medical concerns         Walk 10 feet on uneven surface  activity   Assist Walk 10 feet on uneven surfaces activity did not occur: Safety/medical concerns         Wheelchair     Assist Is the patient using a wheelchair?: Yes Type of  Wheelchair: Manual Wheelchair activity did not occur: Safety/medical concerns         Wheelchair 50 feet with 2 turns activity    Assist    Wheelchair 50 feet with 2 turns activity did not occur: Safety/medical concerns       Wheelchair 150 feet activity     Assist  Wheelchair 150 feet activity did not occur: Safety/medical concerns   Assist Level: Maximal Assistance - Patient 25 - 49%   Blood pressure 134/84, pulse 93, temperature 98.1 F (36.7 C), temperature source Oral, resp. rate 17, height 6' (1.829 m), weight 75.2 kg, SpO2 94 %.    Medical Problem List and Plan: 1.  Debility secondary to sepsis/multi medical with history of traumatic subdural hematoma, traumatic intraparenchymal hemorrhage/occipital fracture, aspiration pneumonia, ischemic CVA             -patient may  shower             -ELOS/Goals: 10-14 days- supervision  Continue CIR 2.  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 Given history of traumatic subdural hematoma and intraparenchymal hemorrhage will continue low  dose aspirin and plan for repeat Doppler to assess propogation.  -Continue Lovenox             -antiplatelet therapy: Aspirin 81 mg daily 3. Pain Management:Voltaren gel, Lidoderm patch as directed, oxycodone as needed 4. Mood: Provide emotional support             -antipsychotic agents: N/A 5. Neuropsych: This patient is not capable of making decisions on his own behalf. 6. Skin/Wound Care: Routine skin checks 7. Fluids/Electrolytes/Nutrition: Routine in and outs with follow-up chemistries 8.  Dysphagia/gastrostomy tube.  Gastrostomy tube [facility] 10/27 at [facility] dysphagia #2 diet with thins, but meds crushed in puree- cannot take Flomax!.  Follow-up speech therapy.   9.  AKI.  Follow-up chemistries 10.  Acute urinary retention/Pseudomonas Klebsiella UTI.  d/c foley. I&O ordered PRN.  Maintain on  Flomax and will again attempt voiding trial while on rehab.  Completed course of IV Zosyn 11.  Hyperlipidemia.  Zocor 12. Thrush- continue Diflucan 200 mg x1 and 100 mg daily x 6 days and con't nystatin 13. Diarrhea- resolved, continue to monitor.  14. Urinary retention- has Foley- will keep at least one per day as per patient's wishes (hx of penile trauma due to cathing at [facility])-stop Flomax since cannot be crushed/put in PEG- start Bethanachol 5 mg QID-  15. Dizziness with therapy: orthostatic vitals ordered, TEDs with therapy ordered 16. Decreased appetite: decrease feeding supplement to BID  LOS: 3 days A FACE TO FACE EVALUATION WAS PERFORMED  Alianny Toelle P Amai Cappiello 10/13/2021, 9:02 AM

## 2021-10-13 NOTE — Progress Notes (Signed)
Speech Language Pathology Daily Session Note  Patient Details  Name: Raymond Stuart MRN: [id number] Date of Birth: [birth year]  Today's Date: 10/13/2021 SLP Individual Time: 0800-0900 SLP Individual Time Calculation (min): 60 min  Short Term Goals: Week 1: SLP Short Term Goal 1 (Week 1): Patient will consume Dys 2 textures and thin liquids without overt s/sx of aspiration and min A for safe swallowing precautions and strategies SLP Short Term Goal 2 (Week 1): Patient will complete simulated medication and/or money management tasks with min A verbal cues to achieve at least 80% accuracy SLP Short Term Goal 3 (Week 1): Pt will demonstrate awareness of errors and apply appropriate corrections with min-to-mod A verbal and visual cues SLP Short Term Goal 4 (Week 1): Patient will complete higher level problem solving tasks with min-to-mod A verbal and visual cues SLP Short Term Goal 5 (Week 1): Patient will participate in RMT evaluation to determine candidacy to improve breath support for speech/vocal intensity  Skilled Therapeutic Interventions:   Patient seen for skilled ST session focusing on cognitive and swallow function goals. Patient awake and alert when SLP entered room. He requested assistance with setup for oral care and washing his face. Breakfast lunch tray in room but untouched and patient stating that he did not want any of the food as it would make him nauseous. He did agree to drinking small amount of protein shake and did not exhibit any overt s/s aspiration or penetration. SLP discussed with patient and MD who entered room during session, regarding his h/o GERD. Patient reported that he at one point was taking 40mg  Nexium daily. Of note, patient's affect improved as compared to previous times this SLP has seen him (on acute at [facility]). He was oriented to time, place, situation. He was able to verbally direct SLP to assist with donning pants without difficulty. Plan to assess for RMST next  visit and work on more complex problem solving tasks. (Patient reporting he was using a [facility] at "5" resistance with SLP in [facility]. Patient left in bed with all needs within reach and PT entering room for session.He continues to benefit from skilled SLP intervention to maximize cognitive, speech and swallow function prior to discharge.  Pain Pain Assessment Pain Scale: 0-10 Pain Score: 0-No pain  Therapy/Group: Individual Therapy  [facility], MA, CCC-SLP Speech Therapy

## 2021-10-13 NOTE — Progress Notes (Signed)
Physical Therapy Session Note  Patient Details  Name: Raymond Stuart MRN: [id number] Date of Birth: [birth year]  Today's Date: 10/13/2021 PT Individual Time: 0900-0953 PT Individual Time Calculation (min): 53 min   Short Term Goals: Week 1:  PT Short Term Goal 1 (Week 1): Pt will tolerate sitting in WC >2 hours between therapies PT Short Term Goal 2 (Week 1): Pt will perform bed mobility with mod assist consistently PT Short Term Goal 3 (Week 1): Pt will transfer to and from [facility] with max assist of 1. PT Short Term Goal 4 (Week 1): Pt will tolerate standing in stedy x 2 minutes with mod assist  Skilled Therapeutic Interventions/Progress Updates:   Received pt semi-reclined in bed, pt agreeable to PT treatment, and reported pain 4/10 in buttocks and reported urge to have BM. Repositioning done to reduce pain levels. Session with emphasis on functional mobility/transfers, toileting, generalized strengthening, dynamic standing balance/coordination, and improved activity tolerance. Donned shoes in supine with total A and pt transferred semi-reclined<>sitting EOB with HOB elevated and use of bedrails with mod A for trunk control. Pt required increased time sitting EOB for symptoms of nausea to decrease - no emesis. Sit<>stand from elevated EOB in [place] with mod A. Dependent transfer to bedside commode and semi-sit<>stand<>sitting on commode with min/mod A and total A for clothing management. Pt able to void but no BM - NT and RN made aware. Sit<>stand from commode in [place] with heavy max of 1 and dependent for peri-care. Bedside commode<>TIS WC in [place] dependently. Pt continued to report mild nausea throughout session, requiring increased rest time for symptoms to resolve. Pt transported through [employer] in [employer] dependently for change of scenery and to uplift spirits - pt appreciative. Pt then performed seated LAQ 2x10 bilaterally (decreased ROM on LLE) but stopped due to increased sacral pain. 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 Pt transported back to room in [facility] total A and requested to return to bed. Sit<>stand in [place] with mod/max A and dependent transfer to bed. Doffed shoes and transferred sit<>supine with max A for BLE management. Concluded session with pt semi-reclined in bed, needs within reach, and bed alarm on. NT present at beside to perform bladder scan.   Therapy Documentation Precautions:  Precautions Precautions: Fall Precaution Comments: g-tube, orthostatic, left hip and knee pain Restrictions Weight Bearing Restrictions: No   Therapy/Group: Individual Therapy Martin Majestic PT, DPT   10/13/2021, 7:27 AM

## 2021-10-13 NOTE — Progress Notes (Signed)
Physical Therapy Session Note  Patient Details  Name: Raymond Stuart MRN: [id number] Date of Birth: 1951-01-04  Today's Date: 10/13/2021 PT Individual Time: 1402-1430 PT Individual Time Calculation (min): 28 min   Short Term Goals: Week 1:  PT Short Term Goal 1 (Week 1): Pt will tolerate sitting in WC >2 hours between therapies PT Short Term Goal 2 (Week 1): Pt will perform bed mobility with mod assist consistently PT Short Term Goal 3 (Week 1): Pt will transfer to and from [facility] with max assist of 1. PT Short Term Goal 4 (Week 1): Pt will tolerate standing in stedy x 2 minutes with mod assist  Skilled Therapeutic Interventions/Progress Updates:  Pt received supine in bed, wife present for birthday. Pt reported 5/10 pain in anus, was premedicated. Offered positional changes and distraction throughout session for pain relief. Pt reported high levels of nausea and politely declined therapy in fear that he would start "aggressively puking" again. Informed pt he had a vestibular eval scheduled, in which pt and wife had numerous questions. Remainder of session spent educating pt on vestibular's role in balance, BPPV and available treatments. Pt very discouraged over current mobility status, provided encouragement and ensured him his team was working to help him which lightened his mood. Pt was left supine in bed, wife present, all needs in reach.   Therapy Documentation Precautions:  Precautions Precautions: Fall Precaution Comments: g-tube, orthostatic, left hip and knee pain Restrictions Weight Bearing Restrictions: No   Therapy/Group: Individual Therapy Jill Alexanders Magdalen Cabana, PT, DPT  10/13/2021, 7:56 AM

## 2021-10-13 NOTE — Progress Notes (Signed)
Pt c/o pain during I/O cath. Ordered lidocain jelly to decrease pain during procedure.

## 2021-10-13 NOTE — Progress Notes (Signed)
Pt had n/v episode during OT session. Pt was up in shower. Pt refused any medication at that time. Offered ginger ale when out of shower. Pt noted with poor PO intake. Pt didn't eat any breakfast and only a few bites of lunch. Pt has not reported any additional episodes of nausea and vomiting.

## 2021-10-13 NOTE — IPOC Note (Signed)
Overall Plan of Care [facility]) Patient Details Name: Raymond Stuart MRN: [id number] DOB: [birth year]  Admitting Diagnosis: Sepsis due to undetermined organism [facility]), debility  Hospital Problems: Principal Problem:   Sepsis due to undetermined organism [facility]) Active Problems:   Type 2 diabetes mellitus with hyperglycemia (HCC)   Protein-calorie malnutrition, severe   Ischemic stroke (HCC)   PEG (percutaneous endoscopic gastrostomy) status (HCC)   Sepsis (HCC)     Functional Problem List: Nursing Endurance, Bowel, Nutrition, Safety, Skin Integrity, Medication Management, Bladder, Pain  PT Balance, Behavior, Edema, Endurance, Motor, Nutrition, Pain, Perception, Safety, Sensory  OT Balance, Behavior, Endurance, Motor, Pain, Perception, Safety, Sensory, Skin Integrity  SLP Cognition  TR         Basic ADL's: OT Grooming, Eating, Bathing, Dressing, Toileting     Advanced  ADL's: OT Simple Meal Preparation     Transfers: PT Bed Mobility, Bed to Chair, Car, Occupational psychologist, Research scientist (life sciences): PT Ambulation, Psychologist, prison and probation services, Stairs     Additional Impairments: OT    SLP Swallowing, Communication, Social Cognition expression Problem Solving, Memory, Attention, Awareness  TR      Anticipated Outcomes Item Anticipated Outcome  Self Feeding Mod I  Swallowing  mod I   Basic self-care  Min-Mod A  Toileting  Min-Mod A   Bathroom Transfers Min-Mod A  Bowel/Bladder  Manage bowel w min assist and bladder with total assist  Transfers  Min assist with LRAD  Locomotion  Mod assist ambulatory for short distances. supervision assist WC mobility  Communication  mod I  Cognition  sup A  Pain  at or below level4  Safety/Judgment  maintain w cues/reminders   Therapy Plan: PT Intensity: Minimum of 1-2 x/day ,45 to 90 minutes PT Frequency: 5 out of 7 days PT Duration Estimated Length of Stay: 25-28 days OT Intensity: Minimum of 1-2 x/day, 45 to 90 minutes OT  Frequency: 5 out of 7 days OT Duration/Estimated Length of Stay: 3-4 weeks SLP Intensity: Minumum of 1-2 x/day, 30 to 90 minutes SLP Frequency: 3 to 5 out of 7 days SLP Duration/Estimated Length of Stay: 3-4 weeks   Due to the current state of emergency, patients may not be receiving their 3-hours of Medicare-mandated therapy.   Team Interventions: Nursing Interventions Patient/Family Education, Bowel Management, Pain Management, Skin Care/Wound Management, Dysphagia/Aspiration Precaution Training, Discharge Planning, Medication Management, Disease Management/Prevention, Bladder Management  PT interventions Ambulation/gait training, Community reintegration, DME/adaptive equipment instruction, Neuromuscular re-education, Psychosocial support, Stair training, UE/LE Strength taining/ROM, Wheelchair propulsion/positioning, Warden/ranger, Discharge planning, Functional electrical stimulation, Pain management, Skin care/wound management, Therapeutic Activities, UE/LE Coordination activities, Cognitive remediation/compensation, Disease management/prevention, Functional mobility training, Patient/family education, Splinting/orthotics, Therapeutic Exercise, Visual/perceptual remediation/compensation  OT Interventions Balance/vestibular training, Self Care/advanced ADL retraining, Therapeutic Exercise, Wheelchair propulsion/positioning, Disease mangement/prevention, DME/adaptive equipment instruction, Pain management, Skin care/wound managment, UE/LE Strength taining/ROM, Community reintegration, Equities trader education, UE/LE Coordination activities, Discharge planning, Functional mobility training, Psychosocial support, Therapeutic Activities  SLP Interventions Cognitive remediation/compensation, Cueing hierarchy, Dysphagia/aspiration precaution training, Functional tasks, Patient/family education, Therapeutic Activities  TR Interventions    SW/CM Interventions Discharge Planning, Patient/Family  Education, Psychosocial Support, Disease Management/Prevention   Barriers to Discharge MD  Medical stability  Nursing Decreased caregiver support, Incontinence, Wound Care, Nutrition means, New oxygen 1 level w wife; daughter nearby  PT Home environment access/layout, [employer], Insurance for SNF coverage, Behavior    OT Decreased caregiver support, Home environment access/layout, Wound Care    SLP      SW  Insurance for SNF coverage, Decreased caregiver support, Lack of/limited family support     Team Discharge Planning: Destination: PT-Home ,OT- Home , SLP-Home Projected Follow-up: PT-Home health PT, OT-  Home health OT, 24 hour supervision/assistance, SLP-Other (comment) (TBD) Projected Equipment Needs: PT-Wheelchair (measurements), Wheelchair cushion (measurements), Rolling walker with 5" wheels, To be determined, OT- To be determined, SLP-None recommended by SLP Equipment Details: PT- , OT-Reports having BSC, unclear on shower equipment at this time Patient/family involved in discharge planning: PT- Patient,  OT-Patient, SLP-Patient  MD ELOS: 25-28 days Medical Rehab Prognosis:  Excellent Assessment: The patient has been admitted for CIR therapies with the diagnosis of debility after sepsis. The team will be addressing functional mobility, strength, stamina, balance, safety, adaptive techniques and equipment, self-care, bowel and bladder mgt, patient and caregiver education, cognition, communication, community reentry. Goals have been set at min to mod assist with self-care and mobility and supervision to mod I with cognition and communication.   Due to the current state of emergency, patients may not be receiving their 3 hours per day of Medicare-mandated therapy.    Ranelle Oyster, MD, FAAPMR     See Team Conference Notes for weekly updates to the plan of care

## 2021-10-14 DIAGNOSIS — Z931 Gastrostomy status: Secondary | ICD-10-CM

## 2021-10-14 DIAGNOSIS — B370 Candidal stomatitis: Secondary | ICD-10-CM

## 2021-10-14 DIAGNOSIS — E871 Hypo-osmolality and hyponatremia: Secondary | ICD-10-CM

## 2021-10-14 DIAGNOSIS — E785 Hyperlipidemia, unspecified: Secondary | ICD-10-CM

## 2021-10-14 LAB — GLUCOSE, CAPILLARY
Glucose-Capillary: 107 mg/dL — ABNORMAL HIGH (ref 70–99)
Glucose-Capillary: 111 mg/dL — ABNORMAL HIGH (ref 70–99)
Glucose-Capillary: 124 mg/dL — ABNORMAL HIGH (ref 70–99)
Glucose-Capillary: 125 mg/dL — ABNORMAL HIGH (ref 70–99)
Glucose-Capillary: 125 mg/dL — ABNORMAL HIGH (ref 70–99)
Glucose-Capillary: 129 mg/dL — ABNORMAL HIGH (ref 70–99)

## 2021-10-14 NOTE — Progress Notes (Signed)
Occupational Therapy Session Note  Patient Details  Name: Raymond Stuart MRN: [id number] Date of Birth: Aug 25, 1951  Today's Date: 10/14/2021 OT Individual Time: 1505-1530 OT Individual Time Calculation (min): 25 min    Short Term Goals: Week 1:  OT Short Term Goal 1 (Week 1): Pt will completed sit > stand with Max A x1 in prep for functional ADL OT Short Term Goal 2 (Week 1): Pt will complete UBD with mod A OT Short Term Goal 3 (Week 1): Pt will complete LBD with max A  Skilled Therapeutic Interventions/Progress Updates:  Skilled OT intervention completed with focus on functional transfers. Pt received seated in w/c, with wife and friend present. Pt reporting pain in L posterior thigh, presumably from new w/c cushion. Pt reported that he stayed in w/c between all therapy sessions today, and requested assist to bed. Pt able to sit > stand using stedy with cues needed to bring shins closer to shin rest, then needed mod A to stand. Encouraged using focal point on wall throughout maneuvering stedy to overtop of bed to prevent vestibular upset. Sit > stand with min-mod A, then min A for descent to seated EOB. Further promoted incremental focal points as pt completed bed mobility from EOB to supine in bed. Pt tends to have worst dizziness and vestibular upset with rolling during bed mobility, but reports it has become more manageable while using the focal point technique. Able to position BLEs while supine in bed. Educated on need to keep strength up by continuing his nutritional intake as pt/pt's wife reports his appetite is fair. Discussed how nutrition plays a big part in recovery, with energy needed for transfers and all daily functions. Pt left with HOB >30 in case RN in room for peg tube, with bed alarm on, all needs in reach and wife/friend in room at end of session.  Therapy Documentation Precautions:  Precautions Precautions: Fall Precaution Comments: g-tube, orthostatic, left hip and knee  pain Restrictions Weight Bearing Restrictions: No  Pain: Unrated pain on bottom of L thigh near buttocks, helped pt position into bed vs w/c to relieve pressure. Improvement reported    Therapy/Group: Individual Therapy  Hatim Homann E Ronia Hazelett 10/14/2021, 7:26 AM
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
## 2021-10-14 NOTE — Progress Notes (Signed)
Physical Therapy Session Note  Patient Details  Name: Raymond Stuart MRN: [id number] Date of Birth: 05/20/1951  Today's Date: 10/14/2021 PT Individual Time: 8101-7510 PT Individual Time Calculation (min): 56 min   Short Term Goals: Week 1:  PT Short Term Goal 1 (Week 1): Pt will tolerate sitting in WC >2 hours between therapies PT Short Term Goal 2 (Week 1): Pt will perform bed mobility with mod assist consistently PT Short Term Goal 3 (Week 1): Pt will transfer to and from [facility] with max assist of 1. PT Short Term Goal 4 (Week 1): Pt will tolerate standing in stedy x 2 minutes with mod assist  Skilled Therapeutic Interventions/Progress Updates:   Received pt semi-reclined in TIS [facility] with wife present at bedside. Pt agreeable to PT treatment and reported pain 6/10 in sacrum from pressure - repositioning done to reduce pain levels. Provided pt with 18x18 Roho cushion and pt transferred sit<>stand in [place] with mod A and switched out cushions - pt reported immediate relief. Session with emphasis on functional mobility/transfers, generalized strengthening, dynamic standing balance/coordination, toileting, and improved activity tolerance. Pt transported to/from room in [facility] total A for time management purposes. Worked on sit<>stands inside // bars x 2 reps with therapist in front guarding bilateral knees - no buckling noted. Pt able to stand ~30 seconds each trial prior to requesting to sit. Pt then requested to return to room due to fatigue and reported urge to have BM. Sit<>stand in [place] with mod A and dependent transfer to/from bedside commode. Semi-sit<>stand<>sitting on commode with min A with total A for clothing management. Pt with medium/large BM and required total A for peri-care in standing. Encouraged pt to stay up in TIS [facility] for OT session in 15 minutes and pt agreed, despite 9/10 fatigue. Concluded session with pt semi-reclined in TIS [facility] with all needs within reach and wife present at bedside.    Therapy Documentation Precautions:  Precautions Precautions: Fall Precaution Comments: g-tube, orthostatic, left hip and knee pain Restrictions Weight Bearing Restrictions: No  Therapy/Group: Individual Therapy Martin Majestic PT, DPT   10/14/2021, 7:31 AM
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
## 2021-10-15 DIAGNOSIS — I639 Cerebral infarction, unspecified: Secondary | ICD-10-CM

## 2021-10-15 DIAGNOSIS — G479 Sleep disorder, unspecified: Secondary | ICD-10-CM

## 2021-10-15 LAB — GLUCOSE, CAPILLARY
Glucose-Capillary: 100 mg/dL — ABNORMAL HIGH (ref 70–99)
Glucose-Capillary: 102 mg/dL — ABNORMAL HIGH (ref 70–99)
Glucose-Capillary: 108 mg/dL — ABNORMAL HIGH (ref 70–99)
Glucose-Capillary: 113 mg/dL — ABNORMAL HIGH (ref 70–99)
Glucose-Capillary: 116 mg/dL — ABNORMAL HIGH (ref 70–99)
Glucose-Capillary: 122 mg/dL — ABNORMAL HIGH (ref 70–99)
Glucose-Capillary: 136 mg/dL — ABNORMAL HIGH (ref 70–99)

## 2021-10-15 MED ORDER — MELATONIN 3 MG PO TABS
1.5000 mg | ORAL_TABLET | Freq: Every day | ORAL | Status: DC
  Administered 2021-10-15 – 2021-10-16 (×2): 1.5 mg via ORAL
  Filled 2021-10-15 (×2): qty 1

## 2021-10-15 MED ORDER — MELATONIN 3 MG PO TABS: 3.0000 mg | ORAL_TABLET | Freq: Every evening | ORAL | Status: DC | PRN

## 2021-10-15 NOTE — Progress Notes (Signed)
Physical Therapy Session Note  Patient Details  Name: Raymond Stuart MRN: [id number] Date of Birth: 1950/11/30  Today's Date: 10/16/2021 PT Individual Time: 0805-0900  PT Individual Time Calculation (min): 55 min    Short Term Goals: Week 1:  PT Short Term Goal 1 (Week 1): Pt will tolerate sitting in WC >2 hours between therapies PT Short Term Goal 2 (Week 1): Pt will perform bed mobility with mod assist consistently PT Short Term Goal 3 (Week 1): Pt will transfer to and from [facility] with max assist of 1. PT Short Term Goal 4 (Week 1): Pt will tolerate standing in stedy x 2 minutes with mod assist  Skilled Therapeutic Interventions/Progress Updates:  Patient supine in bed on entrance to room. Brushing teeth wth setup at tray table. Patient alert and agreeable to PT session. Max/ TotAfor dressing socks, pants in supine, then shoes in sitting.   Patient with low level pain complaint at start of session. Pt pre-medicated. Slight increase in pain during standing bouts and addressed with repositioning.   Therapeutic Activity: Bed Mobility: Patient performed supine --> sit with ModA requiring pause halfway to alleviate dizziness from change in positioning. Remains in seated position for brief period with close supervision while positioning STEDY for transfer to w/c. VC/ tc required for preparation to transfer and for positioning/ technique. No complaint of nausea during transitioning.  Transfers: For transfer into STEDY, pt requires ModA for power up and completes with minA. Descent to w/c seat with MinA. Progressed to sit<>stands inside // bars x 2 reps with therapist positioned in front guarding L knee - no buckling noted. Pt rises to stand with CGA and maintains standing for ~30 seconds each bout prior to increase in pain and request to sit. During standing bouts, mld lateral weight shifting initiated.   Patient left seated upright in TIS at end of session with brakes locked, belt alarm set, and all  needs within reach in order for pt to eat breakfast with NT present.      Therapy Documentation Precautions:  Precautions Precautions: Fall Precaution Comments: g-tube, orthostatic, left hip and knee pain Restrictions Weight Bearing Restrictions: No General:    Therapy/Group: Individual Therapy  Loel Dubonnet PT, DPT 10/15/2021, 9:04 AM

## 2021-10-15 NOTE — Progress Notes (Signed)
PROGRESS NOTE   Subjective/Complaints: Patient seen laying in bed this AM.  He states he did not sleep well overnight because he is in the hospital.  ROS: Denies CP, SOB, N/V/D  Objective:   No results found. No results for input(s): WBC, HGB, HCT, PLT in the last 72 hours.  No results for input(s): NA, K, CL, CO2, GLUCOSE, BUN, CREATININE, CALCIUM in the last 72 hours.   Intake/Output Summary (Last 24 hours) at 10/15/2021 1632 Last data filed at 10/15/2021 1300 Gross per 24 hour  Intake 270 ml  Output 1675 ml  Net -1405 ml      Pressure Injury Buttocks Left;Anterior;Medial Deep Tissue Pressure Injury - Purple or maroon localized area of discolored intact skin or blood-filled blister due to damage of underlying soft tissue from pressure and/or shear. 1.5X1 inner buttock beside s (Active)     Location: Buttocks  Location Orientation: Left;Anterior;Medial  Staging: Deep Tissue Pressure Injury - Purple or maroon localized area of discolored intact skin or blood-filled blister due to damage of underlying soft tissue from pressure and/or shear.  Wound Description (Comments): 1.5X1 inner buttock beside stage 2  Present on Admission: Yes    Physical Exam: Vital Signs Blood pressure 124/71, pulse 86, temperature 97.8 F (36.6 C), resp. rate 20, height 6' (1.829 m), weight 76.6 kg, SpO2 97 %. Constitutional: No distress . Vital signs reviewed. HENT: Normocephalic.  Atraumatic. Eyes: EOMI. No discharge. Cardiovascular: No JVD.  RRR. Respiratory: Normal effort.  No stridor.  Bilateral clear to auscultation. GI: Non-distended.  BS +. +PEG. Skin: Warm and dry.  Intact. Psych: Normal mood.  Normal behavior. Musc: No edema in extremities.  No tenderness in extremities. Neuro: Alert Motor:  RUE 4+/5 deltoid- 5-/5 otherwise, stable LUE- deltoid 4/5; otherwise 4+/5, stable RLE- 5-/5 LLE- HF 1+-2-/5; KE 2/5, DF 4-/5    Assessment/Plan: 1. Functional deficits which require 3+ hours per day of interdisciplinary therapy in a comprehensive inpatient rehab setting. Physiatrist is providing close team supervision and 24 hour management of active medical problems listed below. Physiatrist and rehab team continue to assess barriers to discharge/monitor patient progress toward functional and medical goals  Care Tool:  Bathing    Body parts bathed by patient: Right arm, Left arm, Chest, Abdomen, Right upper leg, Left upper leg, Face   Body parts bathed by helper: Buttocks     Bathing assist Assist Level: Total Assistance - Patient < 25%     Upper Body Dressing/Undressing Upper body dressing   What is the patient wearing?: Pull over shirt    Upper body assist Assist Level: Maximal Assistance - Patient 25 - 49%    Lower Body Dressing/Undressing Lower body dressing      What is the patient wearing?: Pants     Lower body assist Assist for lower body dressing: Maximal Assistance - Patient 25 - 49%     Toileting Toileting    Toileting assist Assist for toileting: Total Assistance - Patient < 25%     Transfers Chair/bed transfer  Transfers assist  Chair/bed transfer activity did not occur: Safety/medical concerns  Chair/bed transfer assist level: Dependent - mechanical lift     Locomotion  Ambulation   Ambulation assist   Ambulation activity did not occur: Safety/medical concerns          Walk 10 feet activity   Assist  Walk 10 feet activity did not occur: Safety/medical concerns        Walk 50 feet activity   Assist Walk 50 feet with 2 turns activity did not occur: Safety/medical concerns         Walk 150 feet activity   Assist Walk 150 feet activity did not occur: Safety/medical concerns         Walk 10 feet on uneven surface  activity   Assist Walk 10 feet on uneven surfaces activity did not occur: Safety/medical concerns          Wheelchair     Assist Is the patient using a wheelchair?: Yes Type of Wheelchair: Manual Wheelchair activity did not occur: Safety/medical concerns         Wheelchair 50 feet with 2 turns activity    Assist    Wheelchair 50 feet with 2 turns activity did not occur: Safety/medical concerns       Wheelchair 150 feet activity     Assist  Wheelchair 150 feet activity did not occur: Safety/medical concerns       Blood pressure 124/71, pulse 86, temperature 97.8 F (36.6 C), resp. rate 20, height 6' (1.829 m), weight 76.6 kg, SpO2 97 %.    Medical Problem List and Plan: 1.  Debility secondary to sepsis/multi medical with history of traumatic subdural hematoma, traumatic intraparenchymal hemorrhage/occipital fracture, aspiration pneumonia, ischemic CVA  Continue CIR 2.  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 Given history of traumatic subdural hematoma and intraparenchymal hemorrhage will continue low dose aspirin and plan for repeat Doppler to assess propogation.  -Continue Lovenox             -antiplatelet therapy: Aspirin 81 mg daily 3. Pain Management:Voltaren gel, Lidoderm patch as directed, oxycodone as needed  Controlled on 11/20 4. Mood: Provide emotional support             -antipsychotic agents: N/A 5. Neuropsych: This patient is not capable of making decisions on his own behalf. 6. Skin/Wound Care: Routine skin checks 7. Fluids/Electrolytes/Nutrition: Routine in and outs  8.  Dysphagia/gastrostomy tube.  G[facility]rost[facility] 10/27 [facility] dysphagia #2 diet with thins, but meds crushed in puree- cannot take Flomax!.  Follow-up speech therapy.   9.  AKI.  Resolved 10.  Acute urinary retention/Pseudomonas Klebsiella UTI.  d/ced foley. I&O ordered PRN.  Completed course of IV Zosyn 11.  Hyperlipidemia.  Zocor 12. Thrush- continue Diflucan 200 mg x1 and 100 mg  daily x 6 days and con't nystatin 13. Diarrhea- resolved, continue to monitor.  14. Urinary retention- stoped Flomax since cannot be crushed/put in PEG- started Bethanachol 5 mg QID-  15. Dizziness with therapy: orthostatic vitals ordered, TEDs   Consider vestibular eval if no improvement 16. Decreased appetite: decrease feeding supplement to BID 17.  Hyponatremia  Sodium 133 on 11/16, labs ordered for tomorrow 18. Sleep disturbance  Melatonin scheduled on 11/20  LOS: 5 days A FACE TO FACE EVALUATION WAS PERFORMED  Raymond Stuart 10/15/2021, 4:32 PM

## 2021-10-16 LAB — BASIC METABOLIC PANEL
Anion gap: 10 (ref 5–15)
BUN: 24 mg/dL — ABNORMAL HIGH (ref 8–23)
CO2: 24 mmol/L (ref 22–32)
Calcium: 9.3 mg/dL (ref 8.9–10.3)
Chloride: 100 mmol/L (ref 98–111)
Creatinine, Ser: 0.82 mg/dL (ref 0.61–1.24)
GFR, Estimated: >60 mL/min (ref >60)
Glucose, Bld: 120 mg/dL — ABNORMAL HIGH (ref 70–99)
Potassium: 4.9 mmol/L (ref 3.5–5.1)
Sodium: 134 mmol/L — ABNORMAL LOW (ref 135–145)

## 2021-10-16 LAB — GLUCOSE, CAPILLARY
Glucose-Capillary: 102 mg/dL — ABNORMAL HIGH (ref 70–99)
Glucose-Capillary: 107 mg/dL — ABNORMAL HIGH (ref 70–99)
Glucose-Capillary: 137 mg/dL — ABNORMAL HIGH (ref 70–99)
Glucose-Capillary: 144 mg/dL — ABNORMAL HIGH (ref 70–99)
Glucose-Capillary: 149 mg/dL — ABNORMAL HIGH (ref 70–99)
Glucose-Capillary: 94 mg/dL (ref 70–99)

## 2021-10-16 NOTE — Progress Notes (Signed)
Occupational Therapy Session Note  Patient Details  Name: Raymond Stuart MRN: [id number] Date of Birth: 07-13-1951  Today's Date: 10/16/2021 OT Individual Time: 1303-1400 OT Individual Time Calculation (min): 57 min    Short Term Goals: Week 1:  OT Short Term Goal 1 (Week 1): Pt will completed sit > stand with Max A x1 in prep for functional ADL OT Short Term Goal 2 (Week 1): Pt will complete UBD with mod A OT Short Term Goal 3 (Week 1): Pt will complete LBD with max A  Skilled Therapeutic Interventions/Progress Updates:    Pt seen for vestibular assessment with reports of dizziness noted with positional changes.  No report of loss of hearing with either ear.  With rolling to the right, he reports dizziness at a 4/10 and then rolling left 2/10.  When transitioning to sitting from right sidelying it was also reported at a 4/10.  Completed visual testing in sitting with tracking and saccades WFLs.  VOR was slightly delayed with both horizontal and vertical head movements to fixed object but this was inconsistent as he did better with this when therapist moved his head for him compared to when he tried to keep his attention on the object while moving his head.  He was able to complete sit to stand with min assist using the RW.  No significant increase in dizziness was noted as well.  With transition back to supine from sitting, he demonstrated a few seconds of upbeating rotational nystagmus lasting less than 5 seconds.  Therapist then completed testing for both the left and right posterior/anterior canals.  He exhibited positive tests in both the right and left posterior canals with upbeating rotational nystagmus lasting less than 15 seconds on each side, but more severe on the left during this testing.  With transition back up to long sitting for re-testing, he also complained of significant dizziness.  He became nauseas and vomited after testing of the right and then the left side and did not want to  pursue Epley Maneuver to work on correcting the bilateral BPPV.  He was left in bed with the call button and phone in reach and [facility] elevated.  Will continue to follow for treatment as tolerated to correct this.  Therapy Documentation Precautions:  Precautions Precautions: Fall Precaution Comments: g-tube, orthostatic, left hip and knee pain Restrictions Weight Bearing Restrictions: No    Pain: Pain Assessment Pain Scale: Faces Pain Score: 0-No pain ADL: See Care Tool Section for some details of mobility and selfcare   Therapy/Group: Individual Therapy  Milayah Krell OTR/L 10/16/2021, 3:39 PM

## 2021-10-16 NOTE — Progress Notes (Signed)
PROGRESS NOTE   Subjective/Complaints: Says he is doing pretty well. Was able to void 4-5 times yesterday! Improved nausea yesterday as well- no more emesis, but still dizzy with standing.   ROS: Denies CP, SOB, N/V/D, +urinary retention- improving  Objective:   No results found. No results for input(s): WBC, HGB, HCT, PLT in the last 72 hours.  Recent Labs    10/16/21 0920  NA 134*  K 4.9  CL 100  CO2 24  GLUCOSE 120*  BUN 24*  CREATININE 0.82  CALCIUM 9.3    Intake/Output Summary (Last 24 hours) at 10/16/2021 1213 Last data filed at 10/16/2021 1517 Gross per 24 hour  Intake 120 ml  Output 1175 ml  Net -1055 ml     Pressure Injury Buttocks Left;Anterior;Medial Deep Tissue Pressure Injury - Purple or maroon localized area of discolored intact skin or blood-filled blister due to damage of underlying soft tissue from pressure and/or shear. 1.5X1 inner buttock beside s (Active)     Location: Buttocks  Location Orientation: Left;Anterior;Medial  Staging: Deep Tissue Pressure Injury - Purple or maroon localized area of discolored intact skin or blood-filled blister due to damage of underlying soft tissue from pressure and/or shear.  Wound Description (Comments): 1.5X1 inner buttock beside stage 2  Present on Admission: Yes    Physical Exam: Vital Signs Blood pressure 108/75, pulse 90, temperature 98.1 F (36.7 C), temperature source Oral, resp. rate 18, height 6' (1.829 m), weight 76.6 kg, SpO2 97 %. Constitutional: No distress . Vital signs reviewed. HENT: Normocephalic.  Atraumatic. Eyes: EOMI. No discharge. Cardiovascular: No JVD.  RRR. Respiratory: Normal effort.  No stridor.  Bilateral clear to auscultation. GI: Non-distended.  BS +. +PEG. Skin: Left buttock pressure injury Psych: Normal mood.  Normal behavior. Musc: No edema in extremities.  No tenderness in extremities. Neuro: Alert Motor:  RUE 4+/5  deltoid- 5-/5 otherwise, stable LUE- deltoid 4/5; otherwise 4+/5, stable RLE- 5-/5 LLE- HF 1+-2-/5; KE 2/5, DF 4-/5   Assessment/Plan: 1. Functional deficits which require 3+ hours per day of interdisciplinary therapy in a comprehensive inpatient rehab setting. Physiatrist is providing close team supervision and 24 hour management of active medical problems listed below. Physiatrist and rehab team continue to assess barriers to discharge/monitor patient progress toward functional and medical goals  Care Tool:  Bathing    Body parts bathed by patient: Right arm, Left arm, Chest, Abdomen, Right upper leg, Left upper leg, Face   Body parts bathed by helper: Buttocks     Bathing assist Assist Level: Total Assistance - Patient < 25%     Upper Body Dressing/Undressing Upper body dressing   What is the patient wearing?: Pull over shirt    Upper body assist Assist Level: Maximal Assistance - Patient 25 - 49%    Lower Body Dressing/Undressing Lower body dressing      What is the patient wearing?: Pants     Lower body assist Assist for lower body dressing: Maximal Assistance - Patient 25 - 49%     Toileting Toileting    Toileting assist Assist for toileting: Total Assistance - Patient < 25%     Transfers Chair/bed transfer  Transfers  assist  Chair/bed transfer activity did not occur: Safety/medical concerns  Chair/bed transfer assist level: Moderate Assistance - Patient 50 - 74%     Locomotion Ambulation   Ambulation assist   Ambulation activity did not occur: Safety/medical concerns          Walk 10 feet activity   Assist  Walk 10 feet activity did not occur: Safety/medical concerns        Walk 50 feet activity   Assist Walk 50 feet with 2 turns activity did not occur: Safety/medical concerns         Walk 150 feet activity   Assist Walk 150 feet activity did not occur: Safety/medical concerns         Walk 10 feet on uneven surface   activity   Assist Walk 10 feet on uneven surfaces activity did not occur: Safety/medical concerns         Wheelchair     Assist Is the patient using a wheelchair?: Yes Type of Wheelchair: Manual Wheelchair activity did not occur: Safety/medical concerns         Wheelchair 50 feet with 2 turns activity    Assist    Wheelchair 50 feet with 2 turns activity did not occur: Safety/medical concerns       Wheelchair 150 feet activity     Assist  Wheelchair 150 feet activity did not occur: Safety/medical concerns       Blood pressure 108/75, pulse 90, temperature 98.1 F (36.7 C), temperature source Oral, resp. rate 18, height 6' (1.829 m), weight 76.6 kg, SpO2 97 %.    Medical Problem List and Plan: 1.  Debility secondary to sepsis/multi medical with history of traumatic subdural hematoma, traumatic intraparenchymal hemorrhage/occipital fracture, aspiration pneumonia, ischemic CVA  Continue CIR 2.  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 Given history of traumatic subdural hematoma and intraparenchymal hemorrhage will continue low dose aspirin and plan for repeat Doppler to assess propogation.  -Continue Lovenox             -antiplatelet therapy: Aspirin 81 mg daily 3. Pain Management:Voltaren gel, Lidoderm patch as directed, oxycodone as needed  Controlled on 11/20 4. Mood: Provide emotional support             -antipsychotic agents: N/A 5. Neuropsych: This patient is not capable of making decisions on his own behalf. 6. Left buttock pressure injury: Routine skin checks, continue wound care.  7. Fluids/Electrolytes/Nutrition: Routine in and outs  8.  Dysphagia/gastrostomy tube.  [facility] 10[facility] dysphagia #2 diet with thins, but meds crushed in puree- cannot take Flomax!.  Follow-up speech therapy.   9.  AKI.  Resolved 10.  Acute urinary  retention/Pseudomonas Klebsiella UTI.  d/ced foley. I&O ordered PRN.  Completed course of IV Zosyn. Continue bethanaecol, improving 11.  Hyperlipidemia.  Zocor 12. Thrush- continue Diflucan 200 mg x1 and 100 mg daily x 6 days and con't nystatin 13. Diarrhea- resolved, continue to monitor.  14. Urinary retention- stoped Flomax since cannot be crushed/put in PEG- started Bethanachol 5 mg QID-  15. Dizziness with therapy: orthostatic vitals ordered, TEDs   Consider vestibular eval if no improvement 16. Decreased appetite: decrease feeding supplement to BID 17.  Hyponatremia  Sodium 133 on 11/16, 134 on 11/21, repeat weekly.  18. Sleep disturbance  Melatonin scheduled on 11/20 19. Disposition: HFU scheduled  LOS: 6 days A FACE TO FACE EVALUATION  WAS PERFORMED  Horton Chin 10/16/2021, 12:13 PM

## 2021-10-16 NOTE — Progress Notes (Signed)
Speech Language Pathology Daily Session Note  Patient Details  Name: Raymond Stuart MRN: [id number] Date of Birth: Sep 19, 1951  Today's Date: 10/16/2021 SLP Individual Time: 0850-0920 SLP Individual Time Calculation (min): 30 min  Short Term Goals: Week 1: SLP Short Term Goal 1 (Week 1): Patient will consume Dys 2 textures and thin liquids without overt s/sx of aspiration and min A for safe swallowing precautions and strategies SLP Short Term Goal 2 (Week 1): Patient will complete simulated medication and/or money management tasks with min A verbal cues to achieve at least 80% accuracy SLP Short Term Goal 3 (Week 1): Pt will demonstrate awareness of errors and apply appropriate corrections with min-to-mod A verbal and visual cues SLP Short Term Goal 4 (Week 1): Patient will complete higher level problem solving tasks with min-to-mod A verbal and visual cues SLP Short Term Goal 5 (Week 1): Patient will participate in RMT evaluation to determine candidacy to improve breath support for speech/vocal intensity  Skilled Therapeutic Interventions: Skilled treatment session focused on speech goals. Upon arrival, patient was sitting upright in the wheelchair. Patient had not consumed his breakfast meal and declined despite multiple attempts and SLP providing different options. RN aware. Patient observed with thin liquids via straw without overt s/s of aspiration. Patient had an EMST device in his room. The device was set on 5cm H2O. Patient performed 3 repetitions relatively easily with a self-rating scale of 6/10 effort. SLP increased the device to 15 cm H2O. He completed 3 repetitions with a self-rating scale of effort to 7/10. When SLP attempted to increase his repetitions to 5, patient reported light headedness as well a the "beginning of a headache."  RN aware and administered medications. Recommend patient perform 5 sets of 3 repetitions at 10 cm H2O.  Patient verbalized understanding. Patient left upright  in wheelchair with alarm on and all needs within reach. Continue with current plan of care.      Pain 4/10 headache RN aware and administered medications   Therapy/Group: Individual Therapy  Raymond Stuart 10/16/2021, 3:27 PM

## 2021-10-16 NOTE — Progress Notes (Signed)
Physical Therapy Session Note  Patient Details  Name: Raymond Stuart MRN: [id number] Date of Birth: Dec 28, 1950  Today's Date: 10/16/2021 PT Individual Time: 0800-0845 PT Individual Time Calculation (min): 45 min   Short Term Goals: Week 1:  PT Short Term Goal 1 (Week 1): Pt will tolerate sitting in WC >2 hours between therapies PT Short Term Goal 2 (Week 1): Pt will perform bed mobility with mod assist consistently PT Short Term Goal 3 (Week 1): Pt will transfer to and from [facility] with max assist of 1. PT Short Term Goal 4 (Week 1): Pt will tolerate standing in stedy x 2 minutes with mod assist Week 2:    Week 3:     Skilled Therapeutic Interventions/Progress Updates:    PT initially supine.  Therapist provieds total assist to don Teds. Supine to sit w/hob elevated, additional time, min assist.  Pt instructed to focus on "X" on wall placed by therapist for gaze stabilization. Pt sits w/supervision.  Therapist provided max assist for donning shoes.  Therapist educated pt on use of walker for standing as goal of session.  Pt requires encouragement to attempt, states "you don't know how afraid I am of falling". Able to encourage pt to attempt. Sit to stand from bed w/RW, mod assist, cues for upright posture, cues to focus on "X" for gaze stabilization.  Pt able to stand 30 sec w/min assist, repeated this x 4 w/seated rest between efforts.  W/last effort pt able to perform stand pivot transfer using RW, min assist, cues for safety and sequencing.  Pt then positioned comfortably in TIS.  Noted L legrest length excessive/pt c/o L post thigh discomfort in sitting.  Will relay to next therapist to adjust for improved comfort/pressure distribution.   Pt left oob in wc w/alarm belt set and needs in reach   Therapy Documentation Precautions:  Precautions Precautions: Fall Precaution Comments: g-tube, orthostatic, left hip and knee pain Restrictions Weight Bearing Restrictions:  No G   Therapy/Group: Individual Therapy Rada Hay, PT   Shearon Balo 10/16/2021, 12:15 PM

## 2021-10-16 NOTE — Progress Notes (Addendum)
Physical Therapy Session Note  Patient Details  Name: Raymond Stuart MRN: [id number] Date of Birth: [birth year]  Today's Date: 10/16/2021 PT Individual Time: 1829-9371 PT Individual Time Calculation (min): 58 min   Today's Date: 10/16/2021 PT Missed Time: 17 Minutes Missed Time Reason: Patient fatigue  Short Term Goals: Week 1:  PT Short Term Goal 1 (Week 1): Pt will tolerate sitting in WC >2 hours between therapies PT Short Term Goal 2 (Week 1): Pt will perform bed mobility with mod assist consistently PT Short Term Goal 3 (Week 1): Pt will transfer to and from [facility] with max assist of 1. PT Short Term Goal 4 (Week 1): Pt will tolerate standing in stedy x 2 minutes with mod assist  Skilled Therapeutic Interventions/Progress Updates:   Received pt semi-reclined in TIS WC, pt agreeable to PT treatment, and reported pain 8/10 in buttocks. Repositioning and distraction done to reduce pain levels. Tilted pt back for pressure relief for 1 minute but pt continues to report discomfort with all of the cushions trialed. Session with emphasis on functional mobility/transfers, generalized strengthening, and standing tolerance. Pt transported to/from room in TIS [facility] total A for time management purposes. Shortened legrests for improved pelvic alignment and for pressure relief purposes. In dayroom, pt performed seated BLE strengthening on Kinetron at 30 cm/sec for 4x20 reps with BUE support with emphasis on glute/quad strengthening. Then worked on blocked practice sit<>stands with RW and mod A x 2 reps - while standing pt performed x3 marches bilaterally prior to requesting to sit. Pt reported 8/10 fatigue and stated that his legs felt "wobbly" and that he "didn't trust them" due to extreme fatigue. Pt requested to return to bed and transferred TIS WC<>bed stand<>pivot with RW and mod A. Doffed shoes sitting EOB with total A and transferred sit<>supine with mod A for BLE management. Concluded session with pt  semi-reclined in bed, needs within reach, and bed alarm on. 17 minutes missed of skilled physical therapy due to fatigue.   Therapy Documentation Precautions:  Precautions Precautions: Fall Precaution Comments: g-tube, orthostatic, left hip and knee pain Restrictions Weight Bearing Restrictions: No  Therapy/Group: Individual Therapy Martin Majestic PT, DPT   10/16/2021, 7:27 AM

## 2021-10-17 DIAGNOSIS — R5381 Other malaise: Principal | ICD-10-CM

## 2021-10-17 DIAGNOSIS — R339 Retention of urine, unspecified: Secondary | ICD-10-CM

## 2021-10-17 LAB — CREATININE, SERUM
Creatinine, Ser: 0.75 mg/dL (ref 0.61–1.24)
GFR, Estimated: >60 mL/min (ref >60)

## 2021-10-17 LAB — GLUCOSE, CAPILLARY
Glucose-Capillary: 111 mg/dL — ABNORMAL HIGH (ref 70–99)
Glucose-Capillary: 117 mg/dL — ABNORMAL HIGH (ref 70–99)
Glucose-Capillary: 124 mg/dL — ABNORMAL HIGH (ref 70–99)
Glucose-Capillary: 125 mg/dL — ABNORMAL HIGH (ref 70–99)
Glucose-Capillary: 125 mg/dL — ABNORMAL HIGH (ref 70–99)
Glucose-Capillary: 131 mg/dL — ABNORMAL HIGH (ref 70–99)

## 2021-10-17 MED ORDER — JEVITY 1.5 CAL/FIBER PO LIQD
840.0000 mL | ORAL | Status: DC
  Administered 2021-10-17: 840 mL
  Filled 2021-10-17 (×2): qty 948

## 2021-10-17 MED ORDER — MELATONIN 3 MG PO TABS
3.0000 mg | ORAL_TABLET | Freq: Every day | ORAL | Status: DC
  Administered 2021-10-17 – 2021-11-03 (×18): 3 mg via ORAL
  Filled 2021-10-17 (×18): qty 1

## 2021-10-17 MED ORDER — BETHANECHOL CHLORIDE 10 MG PO TABS
10.0000 mg | ORAL_TABLET | Freq: Four times a day (QID) | ORAL | Status: DC
Start: 2021-10-17 — End: 2021-10-26
  Administered 2021-10-17 – 2021-10-26 (×36): 10 mg
  Filled 2021-10-17 (×36): qty 1

## 2021-10-17 MED ORDER — ENSURE ENLIVE PO LIQD
237.0000 mL | Freq: Three times a day (TID) | ORAL | Status: DC
  Administered 2021-10-17 – 2021-11-03 (×35): 237 mL via ORAL

## 2021-10-17 NOTE — Progress Notes (Signed)
Physical Therapy Session Note  Patient Details  Name: Raymond Stuart MRN: [id number] Date of Birth: 01-Jun-1951  Today's Date: 10/17/2021 PT Individual Time: 0815-0924 PT Individual Time Calculation (min): 69 min   Short Term Goals: Week 1:  PT Short Term Goal 1 (Week 1): Pt will tolerate sitting in WC >2 hours between therapies PT Short Term Goal 2 (Week 1): Pt will perform bed mobility with mod assist consistently PT Short Term Goal 3 (Week 1): Pt will transfer to and from [facility] with max assist of 1. PT Short Term Goal 4 (Week 1): Pt will tolerate standing in stedy x 2 minutes with mod assist  Skilled Therapeutic Interventions/Progress Updates:   Received pt semi-reclined in bed, pt agreeable to PT treatment, and reported pain 5/10 in buttocks. RN notified and present to administer medication during session. Repositioning and distraction done to reduce pain levels. Session with emphasis on functional mobility/transfers, generalized strengthening, dynamic standing balance/coordination, gait training, and improved endurance with activity. Donned ted hose in supine with total A and pt washed face with set up assist. NT present to check blood glucose and pt transferred semi-reclined<>sitting EOB with HOB elevated and use of bedrails with CGA. Donned shoes sitting EOB with total A and RN arrived and requested to inspect sacral wound. Sit<>stand from EOB with RW x 2 trials with mod A. Pt able to remain standing ~ 1 minute for RN to apply new dressing to buttocks and inspect wound. Stand<>pivot bed<>TIS WC with RW and min A on 2nd stand and MD arrived for morning rounds. Pt transported to/from room in TIS WC total A. Sit<>stand with RW and min/mod A with cues for hand placement and ambulated 74ft x 1 and 98ft x 1 with RW and mod A +2 for close WC follow. Pt demonstrates step to gait pattern with flexed trunk, decreased cadence, and decreased bilateral foot clearance. Pt required extensive rest breaks throughout  session and c/o increased L knee pain after gait - resolved with rest. Pt then performed seated hip flexion with 1lb ankle weight 2x10 on RLE but stopped due to increased nausea - but no emesis. Concluded session with pt semi-reclined in TIS WC, needs within reach, and seatbelt alarm on.   Therapy Documentation Precautions:  Precautions Precautions: Fall Precaution Comments: g-tube, orthostatic, left hip and knee pain Restrictions Weight Bearing Restrictions: No  Therapy/Group: Individual Therapy Martin Majestic PT, DPT   10/17/2021, 6:23 AM

## 2021-10-17 NOTE — Progress Notes (Signed)
PROGRESS NOTE   Subjective/Complaints: Pt in good spirits. Still has problems settling down to sleep at night  ROS: Patient denies fever, rash, sore throat, blurred vision, nausea, vomiting, diarrhea, cough, shortness of breath or chest pain, joint or back pain, headache, or mood change.   Objective:   No results found. No results for input(s): WBC, HGB, HCT, PLT in the last 72 hours.  Recent Labs    10/16/21 0920 10/17/21 0503  NA 134*  --   K 4.9  --   CL 100  --   CO2 24  --   GLUCOSE 120*  --   BUN 24*  --   CREATININE 0.82 0.75  CALCIUM 9.3  --     Intake/Output Summary (Last 24 hours) at 10/17/2021 0847 Last data filed at 10/17/2021 0831 Gross per 24 hour  Intake 55 ml  Output 1800 ml  Net -1745 ml     Pressure Injury Buttocks Left;Anterior;Medial Deep Tissue Pressure Injury - Purple or maroon localized area of discolored intact skin or blood-filled blister due to damage of underlying soft tissue from pressure and/or shear. 1.5X1 inner buttock beside s (Active)     Location: Buttocks  Location Orientation: Left;Anterior;Medial  Staging: Deep Tissue Pressure Injury - Purple or maroon localized area of discolored intact skin or blood-filled blister due to damage of underlying soft tissue from pressure and/or shear.  Wound Description (Comments): 1.5X1 inner buttock beside stage 2  Present on Admission: Yes    Physical Exam: Vital Signs Blood pressure 120/73, pulse 91, temperature 98.3 F (36.8 C), temperature source Oral, resp. rate 16, height 6' (1.829 m), weight 76.4 kg, SpO2 95 %. Constitutional: No distress . Vital signs reviewed. HEENT: NCAT, EOMI, oral membranes moist Neck: supple Cardiovascular: RRR without murmur. No JVD    Respiratory/Chest: CTA Bilaterally without wheezes or rales. Normal effort    GI/Abdomen: BS +, non-tender, non-distended, +PEG site clean Ext: no clubbing, cyanosis, or  edema Psych: pleasant and cooperative  Skin: Left buttock pressure injury Musc: No edema in extremities.  No tenderness in extremities. Neuro: Alert Motor:  RUE 4+/5 deltoid- 5-/5 otherwise,stable LUE- deltoid 4/5; otherwise 4+/5, stable RLE- 5-/5 LLE- HF 1+-2-/5; KE 2/5, DF 4-/5   Assessment/Plan: 1. Functional deficits which require 3+ hours per day of interdisciplinary therapy in a comprehensive inpatient rehab setting. Physiatrist is providing close team supervision and 24 hour management of active medical problems listed below. Physiatrist and rehab team continue to assess barriers to discharge/monitor patient progress toward functional and medical goals  Care Tool:  Bathing    Body parts bathed by patient: Right arm, Left arm, Chest, Abdomen, Right upper leg, Left upper leg, Face   Body parts bathed by helper: Buttocks     Bathing assist Assist Level: Total Assistance - Patient < 25%     Upper Body Dressing/Undressing Upper body dressing   What is the patient wearing?: Pull over shirt    Upper body assist Assist Level: Maximal Assistance - Patient 25 - 49%    Lower Body Dressing/Undressing Lower body dressing      What is the patient wearing?: Pants     Lower body assist  Assist for lower body dressing: Maximal Assistance - Patient 25 - 49%     Toileting Toileting    Toileting assist Assist for toileting: Total Assistance - Patient < 25%     Transfers Chair/bed transfer  Transfers assist  Chair/bed transfer activity did not occur: Safety/medical concerns  Chair/bed transfer assist level: Moderate Assistance - Patient 50 - 74%     Locomotion Ambulation   Ambulation assist   Ambulation activity did not occur: Safety/medical concerns          Walk 10 feet activity   Assist  Walk 10 feet activity did not occur: Safety/medical concerns        Walk 50 feet activity   Assist Walk 50 feet with 2 turns activity did not occur:  Safety/medical concerns         Walk 150 feet activity   Assist Walk 150 feet activity did not occur: Safety/medical concerns         Walk 10 feet on uneven surface  activity   Assist Walk 10 feet on uneven surfaces activity did not occur: Safety/medical concerns         Wheelchair     Assist Is the patient using a wheelchair?: Yes Type of Wheelchair: Manual Wheelchair activity did not occur: Safety/medical concerns         Wheelchair 50 feet with 2 turns activity    Assist    Wheelchair 50 feet with 2 turns activity did not occur: Safety/medical concerns       Wheelchair 150 feet activity     Assist  Wheelchair 150 feet activity did not occur: Safety/medical concerns       Blood pressure 120/73, pulse 91, temperature 98.3 F (36.8 C), temperature source Oral, resp. rate 16, height 6' (1.829 m), weight 76.4 kg, SpO2 95 %.    Medical Problem List and Plan: 1.  Debility secondary to sepsis/multi medical with history of traumatic subdural hematoma, traumatic intraparenchymal hemorrhage/occipital fracture, aspiration pneumonia, ischemic CVA  -Continue CIR therapies including PT, OT, and SLP  2.  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 Given history of traumatic subdural hematoma and intraparenchymal hemorrhage will continue low dose aspirin and plan for repeat Doppler to assess propogation.  -Continue Lovenox             -antiplatelet therapy: Aspirin 81 mg daily 3. Pain Management:Voltaren gel, Lidoderm patch as directed, oxycodone as needed  Controlled on 11/22 4. Mood: Provide emotional support             -antipsychotic agents: N/A 5. Neuropsych: This patient is not capable of making decisions on his own behalf. 6. Left buttock pressure injury: Routine skin checks, continue wound care.  7. Fluids/Electrolytes/Nutrition: Routine in and outs  8.  Dysphagia/gastrostomy  tube.  Gastrost[facility] tube [facility] 10/27 [facility] dysphagia #2 diet with thins, but meds crushed in puree- cannot take Flomax!.  Advance per speech therapy.   9.  AKI.  Resolved 10.  Acute urinary retention/Pseudomonas Klebsiella UTI.  d/ced foley. I&O ordered PRN.  Completed course of IV Zosyn. Continue bethanaecol, improving 11.  Hyperlipidemia.  Zocor 12. Thrush- continue Diflucan 200 mg x1 and 100 mg daily x 6 days and con't nystatin 13. Diarrhea- resolved, continue to monitor.  14. Urinary retention- stoped Flomax since cannot be crushed/put in PEG- started Bethanachol 5 mg QID- 11/22: still with large volumes/needing caths -increase urecholine to 10mg  qid  15. Dizziness with therapy: orthostatic vitals ordered, TEDs   Consider vestibular eval if no improvement 16. Decreased appetite: decrease feeding supplement to BID 17.  Hyponatremia  Sodium 133 on 11/16, 134 on 11/21, repeat weekly.  18. Sleep disturbance  Melatonin scheduled on 11/20--increase to 3mg  11/22 19. Disposition: HFU scheduled  LOS: 7 days A FACE TO FACE EVALUATION WAS PERFORMED  12/22 10/17/2021, 8:47 AM

## 2021-10-17 NOTE — Progress Notes (Signed)
Occupational Therapy Session Note  Patient Details  Name: Raymond Stuart MRN: [id number] Date of Birth: [birth year]  Today's Date: 10/17/2021 OT Individual Time: 1407-1505 OT Individual Time Calculation (min): 58 min    Short Term Goals: Week 2:  OT Short Term Goal 1 (Week 2): Pt will complete LBD with max A OT Short Term Goal 2 (Week 2): Pt will complete toileitng tasks with Max A OT Short Term Goal 3 (Week 2): Pt will maintain standing for functional task for at least 3 mins to promote increased endurance  Skilled Therapeutic Interventions/Progress Updates:    Pt in bed to start with focus on session for treatment of BPPV from testing results yesterday.  Completed Epley Maneuver for the right ear to treat posterior canal BPPV.  Pt with some increased nausea after finishing maneuver.  Re-tested the right ear without any significant noted nystagmus.  Next did testing of the left ear with completion of the Epley Maneuver as well.  Noted posterior canal BPPV as well with upward rotational nystagmus lasting less than 10 seconds.  Once all positions of maneuver were completed, pt reported some more nausea in sitting.  Did not re-test any further to gage progress based on pt preference to stop at this point, as he did not want to vomit.  Had him transition back to supine with min assist with HOB elevated above 30 degrees to rest.  Discussed with pt and his spouse to monitor his dizziness with therapy the next day to see if there is any improvement and this therapist can again follow up in a few days for further treatment if needed.  Discussed treatment with pt's primary OT as well.  Call button and phone in reach at end of session.    Therapy Documentation Precautions:  Precautions Precautions: Fall Precaution Comments: g-tube Restrictions Weight Bearing Restrictions: No General: General OT Amount of Missed Time: 30 Minutes  Pain: Pain Assessment Pain Scale: 0-10 Pain Score: 5  Pain Type:  Acute pain Pain Location: Back Pain Descriptors / Indicators: Aching Pain Frequency: Intermittent Pain Onset: On-going Pain Intervention(s): Medication (See eMAR) ADL: See Care Tool Section for some details of mobility and selfcare  Therapy/Group: Individual Therapy  Ailed Defibaugh OTR/L 10/17/2021, 4:19 PM

## 2021-10-17 NOTE — Progress Notes (Addendum)
Nutrition Follow-up  DOCUMENTATION CODES:   Severe malnutrition in context of acute illness/injury  INTERVENTION:  Provide Ensure Enlive po TID, each supplement provides 350 kcal and 20 grams of protein.  Continue nocturnal tube feeds using Jevity 1.5 cal formula via PEG at goal rate of 70 ml/hr x 12 hours (6pm-6am).  Provide 45 ml Prosource TF BID per tube.   Free water flushes of 150 ml q 4 hours per tube.   Tube feeding regimen to provide 1340 kcal (61% of kcal needs), 76 grams protein (66% of protein needs), 1538 ml free water.    NUTRITION DIAGNOSIS:   Severe Malnutrition related to acute illness (SDH) as evidenced by percent weight loss, moderate fat depletion, moderate muscle depletion, severe muscle depletion; ongoing  GOAL:   Patient will meet greater than or equal to 90% of their needs; progressing  MONITOR:   PO intake, Supplement acceptance, Diet advancement, Labs, Weight trends, TF tolerance, Skin, I & O's  REASON FOR ASSESSMENT:   Consult Enteral/tube feeding initiation and management  ASSESSMENT:   70 year old right-handed male with history of GERD, hyperlipidemia, urolithiasis who was recently admitted to [facility] 9/26-9/29 and then at [facility] 9/30-11/1 after mechanical fall sustaining subdural hematoma traumatic intracranial hemorrhage ventricular hemorrhage in the setting of acute head injury with occipital fracture complicated by ischemic stroke follow-up aspiration pneumonia in the setting of stroke as well as PEG tube placement 10/27 complicated by AKI with urinary retention indwelling Foley catheter tube. Presented 09/27/2021 to [facility] long hospital from skilled nursing facility after being found  covered in vomitus as well as fever urine culture greater 100,000 Klebsiella as well as Pseudomonas with given completed course of abx. Therapy evaluations completed due to patient decreased functional mobility was admitted to CIR.  Meal completion has been  poor at 0-20%. Pt only able to po 5% at lunch today. Pt reports taste changes has been ongoing, however has been trying to eat at meals. Pt currently has Ensure ordered and has been consuming them. RD to increase Ensure to aid in PO intake. Tube feeds have been modified to nocturnal feeds to encourage PO intake during the day. Noted current nocturnal feed orders of Jevity 1.2 cal at 70 ml/hr x 12 hours to provide only 1008 kcal (46% of kcal needs). As PO intake continues to be poor, RD to switch formula to a higher calorically dense feed to aid in adequate caloric and protein needs. Will switch formula to Jevity 1.5 cal formula.   Labs and medications reviewed.   Diet Order:   Diet Order             DIET DYS 2 Room service appropriate? Yes; Fluid consistency: Thin  Diet effective now                   EDUCATION NEEDS:   Not appropriate for education at this time  Skin:  Skin Assessment: Reviewed RN Assessment Skin Integrity Issues:: DTI DTI: buttocks  Last BM:  11/19  Height:   Ht Readings from Last 1 Encounters:  10/10/21 6' (1.829 m)    Weight:   Wt Readings from Last 1 Encounters:  10/17/21 76.4 kg    BMI:  Body mass index is 22.84 kg/m.  Estimated Nutritional Needs:   Kcal:  2200-2400  Protein:  115-125 grams  Fluid:  >/= 2 L/day  Roslyn Smiling, MS, RD, LDN RD pager number/after hours weekend pager number on Amion.

## 2021-10-17 NOTE — Progress Notes (Addendum)
Occupational Therapy Session Note  Patient Details  Name: Raymond Stuart MRN: [id number] Date of Birth: 1951-05-12  Today's Date: 10/17/2021 OT Individual Time: 1005-1055 OT Individual Time Calculation (min): 50 min    Short Term Goals: Week 1:  OT Short Term Goal 1 (Week 1): Pt will completed sit > stand with Max A x1 in prep for functional ADL OT Short Term Goal 2 (Week 1): Pt will complete UBD with mod A OT Short Term Goal 3 (Week 1): Pt will complete LBD with max A  Skilled Therapeutic Interventions/Progress Updates:  Skilled OT intervention completed with focus on functional transfers, and education on pressure relief techniques. Pt received reclined in tilt in space w/c, agreeable to session. Pt with c/o pressure pain on L buttocks, with education provided about pressure relief techniques including off-loading positions. Completed grooming tasks while seated in w/c at sink with supervision assist. Pt with request to return to bed. Pt completed sit > stand with min A using RW and stand pivot to seated EOB with min A and cues needed for positioning of RW throughout transfer. Educated on how standing and transferring relieves pressure, with pt agreeable to complete mass practice of sit <> stands. Pt completed sit > stand with bed slightly elevated at min A using RW. On 2nd attempt, pt request to toilet. Sit > stand, then stand pivot with min A using RW. Required total A for doffing pants. Increased time needed for continent episode BM only- RN notified to chart. Completed sit > stand with mod A using RW, total A needed for posterior pericare and donning pants. Pt able to stand about 2 mins for hygiene management, then stand pivot using RW with min A to seated EOB. Pt completed bed mobility with min A for BLE management this session per request due to fatigue. No report of dizziness upon semi-supine in bed, however sensation of falling reported when lowering bed toward floor, improved with stillness.  Pt left semi-supine in bed per request with bed alarm on and RN in room at end of session. Advised RN to transfer pt with stedy for [facility] transfers, but can sit > stand pt without stedy with +1 assist.   Therapy Documentation Precautions:  Precautions Precautions: Fall Precaution Comments: g-tube, orthostatic, left hip and knee pain Restrictions Weight Bearing Restrictions: No  Pain: Unrated pain on L buttock, assisted pt to bed for pressure relief, improvement reported   Therapy/Group: Individual Therapy  [name] E [name] 10/17/2021, 12:39 PM

## 2021-10-17 NOTE — Plan of Care (Signed)
  Problem: RH BOWEL ELIMINATION Goal: RH STG MANAGE BOWEL WITH ASSISTANCE Description: STG Manage Bowel with min Assistance. Outcome: Not Progressing LBM 11/19   Problem: RH BLADDER ELIMINATION Goal: RH STG MANAGE BLADDER WITH ASSISTANCE Description: STG Manage Bladder With total Assistance Outcome: Not Progressing; INcontinence

## 2021-10-17 NOTE — Progress Notes (Signed)
Nurse asked patient why he is requesting all medication via peg tube, and patient states "my doctor told me I can't have any medication by mouth". Explained to patient he has sheet above him filled out by speech therapy that meds are okay to be crushed in applesauce and given, patient still addiment that he was told he can not take any medication by mouth. Leaving note so provider can have conversation with patient tomorrow.

## 2021-10-17 NOTE — Progress Notes (Signed)
Occupational Therapy Weekly Progress Note  Patient Details  Name: Raymond Stuart MRN: [id number] Date of Birth: 1951-11-26  Beginning of progress report period: October 11, 2021 End of progress report period: October 18, 2021  Patient has met 2 of 3 short term goals.  Pt is making steady progress towards LTGs. Pt's functional transfers have progressed from using stedy and Max A x2 for sit > stands, to now sit > stand and stand pivot transfers without stedy and min A using RW. Pt has completed a full shower, with min A needed, vs Max A on evaluation. Pt has improved his tolerance with transfers/bed mobility, with decreased vestibular upset and vomiting episodes since vestibular testing was completed. Pt continues to demonstrate decreased activity tolerance, increased pain when sitting up in w/c vs laying in bed, and requires physical assist for ADL management. Pt would benefit from continued skilled OT intervention to be educated on AE that can be used for ADL tasks, increase his endurance, and promote safer/more efficient functional transfers for independence with all self-care tasks.  Patient continues to demonstrate the following deficits: muscle weakness and muscle joint tightness, decreased cardiorespiratoy endurance, and impaired timing and sequencing, decreased coordination, and decreased motor planning and therefore will continue to benefit from skilled OT intervention to enhance overall performance with BADL, iADL, and Reduce care partner burden.  Patient progressing toward long term goals..  Continue plan of care.  OT Short Term Goals Week 1:  OT Short Term Goal 1 (Week 1): Pt will completed sit > stand with Max A x1 in prep for functional ADL OT Short Term Goal 1 - Progress (Week 1): Met OT Short Term Goal 2 (Week 1): Pt will complete UBD with mod A OT Short Term Goal 2 - Progress (Week 1): Met OT Short Term Goal 3 (Week 1): Pt will complete LBD with max A OT Short Term Goal 3 -  Progress (Week 1): Progressing toward goal Week 2:  OT Short Term Goal 1 (Week 2): Pt will complete LBD with max A OT Short Term Goal 2 (Week 2): Pt will complete toileitng tasks with Max A OT Short Term Goal 3 (Week 2): Pt will maintain standing for functional task for at least 3 mins to promote increased endurance    Candee Hoon E Jessey Stehlin 10/17/2021, 4:11 PM

## 2021-10-18 LAB — GLUCOSE, CAPILLARY
Glucose-Capillary: 102 mg/dL — ABNORMAL HIGH (ref 70–99)
Glucose-Capillary: 118 mg/dL — ABNORMAL HIGH (ref 70–99)
Glucose-Capillary: 120 mg/dL — ABNORMAL HIGH (ref 70–99)
Glucose-Capillary: 125 mg/dL — ABNORMAL HIGH (ref 70–99)
Glucose-Capillary: 129 mg/dL — ABNORMAL HIGH (ref 70–99)
Glucose-Capillary: 134 mg/dL — ABNORMAL HIGH (ref 70–99)

## 2021-10-18 MED ORDER — PROSOURCE TF PO LIQD
45.0000 mL | Freq: Every day | ORAL | Status: DC
  Administered 2021-10-19 – 2021-10-22 (×4): 45 mL
  Filled 2021-10-18 (×4): qty 45

## 2021-10-18 MED ORDER — JEVITY 1.2 CAL PO LIQD
356.0000 mL | Freq: Three times a day (TID) | ORAL | Status: DC
  Administered 2021-10-18 – 2021-10-21 (×8): 356 mL
  Filled 2021-10-18 (×14): qty 474

## 2021-10-18 NOTE — Progress Notes (Signed)
Patient ID: Raymond Stuart, male   DOB: 02/20/1951, 70 y.o.   MRN: [id number] Team Conference Report to Patient/Family  Team Conference discussion was reviewed with the patient and caregiver, including goals, any changes in plan of care and target discharge date.  Patient and caregiver express understanding and are in agreement.  The patient has a target discharge date of 11/04/21.  Sw met with pt, provided team conference information. Pt reports still having pain in his bottom and looking for relief. Pt also reports not resting well and melatonin only effective one night. No additional questions or concerns   Dyanne Iha 10/18/2021, 1:15 PM

## 2021-10-18 NOTE — Patient Care Conference (Signed)
Inpatient RehabilitationTeam Conference and Plan of Care Update Date: 10/18/2021   Time: 11:39 AM    Patient Name: Raymond Stuart      Medical Record Number: [id number]  Date of Birth: 05/27/1951 Sex: Male         Room/Bed: 5C04C/5C04C-01 Payor Info: Payor: Multimedia programmer / Plan: [employer] MEDICARE / Product Type: *No Product type* /    Admit Date/Time:  10/10/2021  1:52 PM  Primary Diagnosis:  Sepsis due to undetermined organism [facility])  Hospital Problems: Principal Problem:   Sepsis due to undetermined organism [facility]) Active Problems:   Type 2 diabetes mellitus with hyperglycemia (HCC)   Protein-calorie malnutrition, severe   Ischemic stroke (HCC)   PEG (percutaneous endoscopic gastrostomy) status (HCC)   Sepsis (HCC)   Hyponatremia   Dyslipidemia   Oral thrush   Sleep disturbance    Expected Discharge Date: Expected Discharge Date: 11/04/21  Team Members Present: Physician leading conference: Dr. Claudette Laws Social Worker Present: Lavera Guise, BSW Nurse Present: Chana Bode, RN PT Present: Peter Congo, PT OT Present: Other (comment) [facility] Cheyenne Adas, OT) SLP Present: Elio Forget, SLP PPS Coordinator present : Fae Pippin, SLP     Current Status/Progress Goal Weekly Team Focus  Bowel/Bladder   foley in place, orders to pull AM  Remain continent of bowel. removal of foley.  Chg bath and foley care   Swallow/Nutrition/ Hydration   Dys 2 thin  mod I least restrictive oral diet (anticipate Dys3-regular)  improve PO intake   ADL's   Min A bathing, Supervision UBD, Max A LBD, Max A toileting, functional transfers mod A using RW  min-mod A  AE education, endurance, functional transfers, BUE strengthening   Mobility   supine<>sit min A, sit<>supine mod A, sit<>stand and stand<>pivot transfers with RW mod A, gait 43ft with RW mod A +2  supervision/min A  functional mobilty/transfers, generalized strengthening, dynamic standing balance and standing  tolerance, and imporved endurance with activity.   Communication      mod I      Safety/Cognition/ Behavioral Observations  minA  supervision A mild complex cog  work on functional mild complex to complex cog   Pain   no c/o pain at this time  pain <3/10  assess q shift and prn   Skin   MASD and 2 small spots in crease of buttocks, thrush in mouth, peg tube  Maintain skin as dry as possible to decrease MASD.  apply gerhartts     Discharge Planning:  Discharging home with spouse and children to provide assitance, Min A, 24/7   Team Discussion: Patient with dizziness and urinary retention post stroke/occ fracture. Periods of nausea addressed but poor appetite persists. Patient on HS feeds via PEG and D2 thin diet with meds crushed in puree.  Discussion of bolus feeds to stimulate appetite. Loose stools better and wound issues addressed.  Patient on target to meet rehab goals: yes, currently requires max assist for tranfers and able to ambulate up to 74' with mod assist. Goals for discharge set for supervision - min - mod assist.   *See Care Plan and progress notes for long and short-term goals.   Revisions to Treatment Plan:  Vestibular eval completed 10/17/21   Teaching Needs: Safety, nutritional means, medications, secondary risk management, transfers, skin care, etc  Current Barriers to Discharge: Decreased caregiver support and Nutritional means  Possible Resolutions to Barriers: Family education     Medical Summary Current Status: tbi multiple medical including sepsis. has  PEG. persistent dizziness, ?vestibular. urine retention requiring I/O caths  Barriers to Discharge: Medical stability   Possible Resolutions to Barriers/Weekly Focus: vestibular assessment, urecholine trial. rx uti.   Continued Need for Acute Rehabilitation Level of Care: The patient requires daily medical management by a physician with specialized training in physical medicine and rehabilitation for  the following reasons: Direction of a multidisciplinary physical rehabilitation program to maximize functional independence : Yes Medical management of patient stability for increased activity during participation in an intensive rehabilitation regime.: Yes Analysis of laboratory values and/or radiology reports with any subsequent need for medication adjustment and/or medical intervention. : Yes   I attest that I was present, lead the team conference, and concur with the assessment and plan of the team.   Chana Bode B 10/18/2021, 3:34 PM

## 2021-10-18 NOTE — Progress Notes (Signed)
Occupational Therapy Session Note  Patient Details  Name: Raymond Stuart MRN: [id number] Date of Birth: 13-Dec-1950  Today's Date: 10/18/2021 OT Individual Time: 4098-1191 OT Individual Time Calculation (min): 69 min    Short Term Goals: Week 2:  OT Short Term Goal 1 (Week 2): Pt will complete LBD with max A OT Short Term Goal 2 (Week 2): Pt will complete toileitng tasks with Max A OT Short Term Goal 3 (Week 2): Pt will maintain standing for functional task for at least 3 mins to promote increased endurance  Skilled Therapeutic Interventions/Progress Updates:  Skilled OT intervention completed with focus on dynamic standing, activity tolerance and AE education. Pt received in long sitting in bed, agreeable to session. Reported lack of sleep due to pressure sores. Educated on off-loading techniques, with attempt to use pt's memory foam pillow for L hip, with pt reporting some improvement with technique to be used later. Pt completed bed mobility with supervision for long sitting > seated EOB. No report of dizziness. Applied lotion per request to both feet, then donned TEDs and shoes with total A for time. Completed sit > stand then stand pivot using RW to w/c, with min A. Attempted using pillow under sacrum for pressure relief as well as tilt in space feature with some relief reported. Transported to therapy gym with total A. Completed sit > stand using RW with min A, then able to tolerate about 1.5 mins of standing during functional tabletop game, using mirror for visual feedback of posture, in order to promote dynamic standing and increased activity tolerance needed for ADL tasks. Pt reporting weakness in BLEs, and some dizziness upon sitting however no vomiting episode. Pt required prolonged rest break due to fatigue. Educated on energy conservation strategies, and purpose of the standing task with pt reporting "I can't stand for very long and I need to get better at that so I can do more things  myself." Sit > stand using RW with min A, with pt able to tolerate 1.5 more mins in standing, with pt able to release grasp on RW with each hand to reach towards bottom for donning pants/posterior pericare simulation, before reporting fatigue and returned to seated in w/c. Educated on using reacher for LB dressing, with pt able to thread hospital pants while return demonstrating technique of using reacher. Cues required for positioning and technique throughout task. Transported back to room in w/c with total A, with pt left seated in w/c, with pillows/tilt provided for pressure relief, all needs in reach and RN in room at end of session for transition of care.  Therapy Documentation Precautions:  Precautions Precautions: Fall Precaution Comments: g-tube Restrictions Weight Bearing Restrictions: No  Pain: Unrated pain in sacrum, educated on pressure relief techniques and encouraged functional mobility to off-load, improvement reported   Therapy/Group: Individual Therapy  [name] [name] 10/18/2021, 7:44 AM

## 2021-10-18 NOTE — Progress Notes (Signed)
Physical Therapy Weekly Progress Note  Patient Details  Name: Raymond Stuart MRN: [id number] Date of Birth: Mar 31, 1951  Beginning of progress report period: October 11, 2021 End of progress report period: October 18, 2021  Today's Date: 10/18/2021 PT Individual Time: 0109-3235 PT Individual Time Calculation (min): 61 min   Today's Date: 10/18/2021 PT Missed Time: 14 Minutes Missed Time Reason: Patient fatigue  Patient has met 4 of 4 short term goals. Pt demonstrates slow progress towards long term goals. Pt is currently able to transfer semi-reclined<>sitting EOB with HOB elevated and use of bedrails with supervision but requires min A to transfer sit<>supine. Pt is now able to perform sit<>stands and stand<>pivot transfers with RW and mod A of 1. Pt is ambulating up to 45f with RW and mod A with close WC follow but continues to be limited by pain on buttocks, fatigue, weakness, and BPPV symptoms resulting in nausea. Pt now being treated for posterior canal BPPV and has appeared to be less symptomatic overall during therapy sessions.   Patient continues to demonstrate the following deficits muscle weakness, decreased cardiorespiratoy endurance, peripheral, and decreased sitting balance, decreased standing balance, decreased postural control, and decreased balance strategies and therefore will continue to benefit from skilled PT intervention to increase functional independence with mobility.  Patient progressing toward long term goals..  Continue plan of care.  PT Short Term Goals Week 1:  PT Short Term Goal 1 (Week 1): Pt will tolerate sitting in WC >2 hours between therapies PT Short Term Goal 1 - Progress (Week 1): Met PT Short Term Goal 2 (Week 1): Pt will perform bed mobility with mod assist consistently PT Short Term Goal 2 - Progress (Week 1): Met PT Short Term Goal 3 (Week 1): [facility] PT Short Term Goal 3 - Progress (Week 1): Met PT  Short Term Goal 4 (Week 1): Pt will tolerate standing in stedy x 2 minutes with mod assist PT Short Term Goal 4 - Progress (Week 1): Met Week 2:  PT Short Term Goal 1 (Week 2): pt will transfer sit<>stand with LRAD and min A PT Short Term Goal 2 (Week 2): pt will ambulate 212fwith LRAD and mod A of 1 PT Short Term Goal 3 (Week 2): pt will perform simulated car transfer with LRAD and max A of 1  Skilled Therapeutic Interventions/Progress Updates:  Ambulation/gait training;Community reintegration;DME/adaptive equipment instruction;Neuromuscular re-education;Psychosocial support;Stair training;UE/LE Strength taining/ROM;Wheelchair propulsion/positioning;Balance/vestibular training;Discharge planning;Functional electrical stimulation;Pain management;Skin care/wound management;Therapeutic Activities;UE/LE Coordination activities;Cognitive remediation/compensation;Disease management/prevention;Functional mobility training;Patient/family education;Splinting/orthotics;Therapeutic Exercise;Visual/perceptual remediation/compensation   Today's Interventions: Received pt semi-reclined in bed, pt agreeable to PT treatment, and reported pain 5/10 in buttocks but declined pain medication. Repositioning, rest breaks, and distraction done to reduce pain levels. Session with emphasis on functional mobility/transfers, generalized strengthening, standing balance/tolerance, gait training, and improved activity tolerance. Pt transferred semi-reclined<>sitting EOB with HOB elevated and use of bedrails with close supervision and donned shoes with max A. Pt required x 2 attempts and mod A to stand from elevated bed and transferred bed<>TIS WC with RW and min A. Pt transported to/from room on 5C in TIS WC total A for time management purposes. Sit<>stand with RW and min A and ambulated 2561f 1 and [id number] with RW and min/mod A with close WC follow - pt reported 8/10 fatigue afterwards and requested to be done with standing  activities. Pt required multiple extended rest breaks throughout session but denied any  vestibular symptoms. In ortho gym, pt performed BUE strengthening on UBE at level 2 for 2 minutes forward and 2 minutes backwards with emphasis on strength and endurance prior to requesting therapist assist with getting him comfortable in bed. Stand<>pivot TIS WC<>bed with RW and min A. Doffed shoes with max A and transferred sit<>supine with min A for LE management. Pt rolled onto L side with supervision and therapist placed pillows underneath buttocks and between knees for comfort and pressure relief purposes. Concluded session with pt sidelying in bed, needs within reach, and bed alarm on. 14 minutes missed of skilled physical therapy due to fatigue.   Therapy Documentation Precautions:  Precautions Precautions: Fall Precaution Comments: g-tube Restrictions Weight Bearing Restrictions: No  Therapy/Group: Individual Therapy Alfonse Alpers PT, DPT  10/18/2021, 6:35 AM

## 2021-10-18 NOTE — Progress Notes (Addendum)
PROGRESS NOTE   Subjective/Complaints: Pt without issues overnight. Did have another "dizzy" spell yesterday again when up with therapy.  Nurse reported that pt said he was told he couldn't take meds by mouth (wants them thru peg)  ROS: Patient denies fever, rash, sore throat, blurred vision, nausea, vomiting, diarrhea, cough, shortness of breath or chest pain, joint or back pain, headache, or mood change.   Objective:   No results found. No results for input(s): WBC, HGB, HCT, PLT in the last 72 hours.  Recent Labs    10/16/21 0920 10/17/21 0503  NA 134*  --   K 4.9  --   CL 100  --   CO2 24  --   GLUCOSE 120*  --   BUN 24*  --   CREATININE 0.82 0.75  CALCIUM 9.3  --     Intake/Output Summary (Last 24 hours) at 10/18/2021 0840 Last data filed at 10/18/2021 0532 Gross per 24 hour  Intake 180 ml  Output 1500 ml  Net -1320 ml     Pressure Injury Buttocks Left;Anterior;Medial Deep Tissue Pressure Injury - Purple or maroon localized area of discolored intact skin or blood-filled blister due to damage of underlying soft tissue from pressure and/or shear. 1.5X1 inner buttock beside s (Active)     Location: Buttocks  Location Orientation: Left;Anterior;Medial  Staging: Deep Tissue Pressure Injury - Purple or maroon localized area of discolored intact skin or blood-filled blister due to damage of underlying soft tissue from pressure and/or shear.  Wound Description (Comments): 1.5X1 inner buttock beside stage 2  Present on Admission: Yes    Physical Exam: Vital Signs Blood pressure 119/78, pulse 84, temperature 97.8 F (36.6 C), temperature source Oral, resp. rate 16, height 6' (1.829 m), weight 76.8 kg, SpO2 94 %. Constitutional: No distress . Vital signs reviewed. HEENT: NCAT, EOMI, oral membranes moist Neck: supple Cardiovascular: RRR without murmur. No JVD    Respiratory/Chest: CTA Bilaterally without wheezes or  rales. Normal effort    GI/Abdomen: BS +, non-tender, non-distended, PEG site clean Ext: no clubbing, cyanosis, or edema Psych: pleasant and cooperative  Skin: Left buttock pressure injury Musc: No edema in extremities.  No tenderness in extremities. Neuro: Alert, no CN abnl Motor:  RUE 4+/5 deltoid- 5-/5 otherwise,stable LUE- deltoid 4/5; otherwise 4+/5, stable RLE- 5-/5 LLE- HF 1+-2-/5; KE 2/5, DF 4-/5   Assessment/Plan: 1. Functional deficits which require 3+ hours per day of interdisciplinary therapy in a comprehensive inpatient rehab setting. Physiatrist is providing close team supervision and 24 hour management of active medical problems listed below. Physiatrist and rehab team continue to assess barriers to discharge/monitor patient progress toward functional and medical goals  Care Tool:  Bathing    Body parts bathed by patient: Right arm, Left arm, Chest, Abdomen, Right upper leg, Left upper leg, Face   Body parts bathed by helper: Buttocks     Bathing assist Assist Level: Total Assistance - Patient < 25%     Upper Body Dressing/Undressing Upper body dressing   What is the patient wearing?: Pull over shirt    Upper body assist Assist Level: Maximal Assistance - Patient 25 - 49%  Lower Body Dressing/Undressing Lower body dressing      What is the patient wearing?: Pants     Lower body assist Assist for lower body dressing: Maximal Assistance - Patient 25 - 49%     Toileting Toileting    Toileting assist Assist for toileting: Total Assistance - Patient < 25%     Transfers Chair/bed transfer  Transfers assist  Chair/bed transfer activity did not occur: Safety/medical concerns  Chair/bed transfer assist level: Minimal Assistance - Patient > 75%     Locomotion Ambulation   Ambulation assist   Ambulation activity did not occur: Safety/medical concerns  Assist level: 2 helpers Assistive device: Walker-rolling Max distance: 46ft   Walk 10  feet activity   Assist  Walk 10 feet activity did not occur: Safety/medical concerns  Assist level: 2 helpers Assistive device: Walker-rolling   Walk 50 feet activity   Assist Walk 50 feet with 2 turns activity did not occur: Safety/medical concerns         Walk 150 feet activity   Assist Walk 150 feet activity did not occur: Safety/medical concerns         Walk 10 feet on uneven surface  activity   Assist Walk 10 feet on uneven surfaces activity did not occur: Safety/medical concerns         Wheelchair     Assist Is the patient using a wheelchair?: Yes Type of Wheelchair: Manual Wheelchair activity did not occur: Safety/medical concerns         Wheelchair 50 feet with 2 turns activity    Assist    Wheelchair 50 feet with 2 turns activity did not occur: Safety/medical concerns       Wheelchair 150 feet activity     Assist  Wheelchair 150 feet activity did not occur: Safety/medical concerns       Blood pressure 119/78, pulse 84, temperature 97.8 F (36.6 C), temperature source Oral, resp. rate 16, height 6' (1.829 m), weight 76.8 kg, SpO2 94 %.    Medical Problem List and Plan: 1.  Debility secondary to sepsis/multi medical with history of traumatic subdural hematoma, traumatic intraparenchymal hemorrhage/occipital fracture, aspiration pneumonia, ischemic CVA  -Continue CIR therapies including PT, OT , team conf 2.  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 Given history of traumatic subdural hematoma and intraparenchymal hemorrhage will continue low dose aspirin and plan for repeat Doppler to assess propogation.  -Continue Lovenox             -antiplatelet therapy: Aspirin 81 mg daily 3. Pain Management:Voltaren gel, Lidoderm patch as directed, oxycodone as needed  Controlled on 11/22 4. Mood: Provide emotional support             -antipsychotic agents: N/A 5.  Neuropsych: This patient is not capable of making decisions on his own behalf. 6. Left buttock pressure injury: Routine skin checks, continue wound care.  7. Fluids/Electrolytes/Nutrition: Routine in and outs  8.  Dysphagia/gastrostomy tube.  Gastrostomy tube placed 10/27 at [facility] dysphagia #2 diet with thins, -but meds crushed in puree- can take by mouth! 9.  AKI.  Resolved 10.  Acute urinary retention/Pseudomonas Klebsiella UTI.  d/ced foley. I&O ordered PRN.  Completed course of IV Zosyn.    -urecholine increased to 10mg  qid 11/22  -beginning to void, but more often needing IC  -monitor for progress 11.  Hyperlipidemia.  Zocor 12. Thrush- continue Diflucan 200 mg x1 and  100 mg daily x 6 days and con't nystatin 13. Diarrhea- resolved, continue to monitor.  15. Dizziness with therapy: orthostatic vitals ordered, TEDs   11/23 had another episode yesterday   Will request vestibular eval   16. Decreased appetite: decrease feeding supplement to BID 17.  Hyponatremia  Sodium 133 on 11/16, 134 on 11/21, repeat weekly.  18. Sleep disturbance  Melatonin scheduled on 11/20--increase to 3mg  11/22 19. Disposition: HFU scheduled  LOS: 8 days A FACE TO FACE EVALUATION WAS PERFORMED  12/22 10/18/2021, 8:40 AM

## 2021-10-18 NOTE — Progress Notes (Signed)
Speech Language Pathology Weekly Progress and Session Note  Patient Details  Name: Raymond Stuart MRN: [id number] Date of Birth: 1951-09-15  Beginning of progress report period: 10/11/2021 End of progress report period:  10/18/2021  Today's Date: 10/18/2021 SLP Individual Time: 7425-9563 SLP Individual Time Calculation (min): 45 min  Short Term Goals: Week 1: SLP Short Term Goal 1 (Week 1): Patient will consume Dys 2 textures and thin liquids without overt s/sx of aspiration and min A for safe swallowing precautions and strategies SLP Short Term Goal 1 - Progress (Week 1): Met SLP Short Term Goal 2 (Week 1): Patient will complete simulated medication and/or money management tasks with min A verbal cues to achieve at least 80% accuracy SLP Short Term Goal 2 - Progress (Week 1): Progressing toward goal SLP Short Term Goal 3 (Week 1): Pt will demonstrate awareness of errors and apply appropriate corrections with min-to-mod A verbal and visual cues SLP Short Term Goal 3 - Progress (Week 1): Met SLP Short Term Goal 4 (Week 1): Patient will complete higher level problem solving tasks with min-to-mod A verbal and visual cues SLP Short Term Goal 4 - Progress (Week 1): Progressing toward goal SLP Short Term Goal 5 (Week 1): Patient will participate in RMT evaluation to determine candidacy to improve breath support for speech/vocal intensity SLP Short Term Goal 5 - Progress (Week 1): Met    New Short Term Goals: Week 2: SLP Short Term Goal 1 (Week 2): Patient will complete higher level problem solving tasks with minA verbal and visual cues SLP Short Term Goal 2 (Week 2): Patient will complete simulated medication and/or money management tasks with min A verbal cues to achieve at least 80% accuracy SLP Short Term Goal 3 (Week 2): Patient will consistently return demonstrate to complete RMST as instructed by SLP with minA verbal cues. SLP Short Term Goal 4 (Week 2): Patient will tolerate trials of  advanced, Dys 3 solids without overt s/s aspiration or penetration with supervision A for swallow safety. SLP Short Term Goal 5 (Week 2): Patient will demonstrate awareness to errors and ability to correct during complex level problem solving tasks, with minA verbal and visual cues.  Weekly Progress Updates:  Patient met 3/5 STG's since initial evaluation on 11/16. He is currently tolerating Dys 2 solids, thin liquids however his appetite is very poor. The two goals he did not meet involved complex level problem solving tasks. Patient has started RMST with MD approval. He continues to require verbal cues to initiate and although his affect is generally flat, he has been improving with his ability to engage in conversation as well as in therapy tasks. He is expected to continue to improve with cognitive and swallow function with continued CIR level SLP intervention.   Intensity: Minumum of 1-2 x/day, 30 to 90 minutes Frequency: 3 to 5 out of 7 days Duration/Length of Stay: 11/04/2021 Treatment/Interventions: Cognitive remediation/compensation;Cueing hierarchy;Dysphagia/aspiration precaution training;Functional tasks;Patient/family education;Therapeutic Activities   Daily Session  Skilled Therapeutic Interventions: Patient seen for skilled ST session focusing on cognitive function goals. Patient in bed and with meal tray in front of him, however food looked mainly untouched. Patient had consumed some of the liquids but reported he had "about five bites" of the solid foods (breakfast tray). When asked about reason he hasn't been eating, patient reported that his sense of taste has been off and he is worried he will have more episodes of nausea and vomiting. SLP read nursing note from previous date that patient  had been adamant that he had been told he was only to have medications through his PEG tube. SLP explained to patient that since the MBS, he has been cleared to have meds crushed in puree and that  weaning away from PEG use is important. SLP reviewed patient's medication list in chart with him and did note that all but two medications were still noted to be given via PEG. SLP notified patient's nurse. Plan to discuss medication list and complete medication management tasks in a future visit. Patient left in bed with all needs in place and bed alarm set. He continues to benefit from skilled SLP intervention to maximize cognitive and swallow function prior to discharge.  General    Pain Pain Assessment Pain Scale: 0-10 Pain Score: 0-No pain  Therapy/Group: Individual Therapy  Sonia Baller, MA, CCC-SLP Speech Therapy

## 2021-10-18 NOTE — Progress Notes (Addendum)
Nutrition Follow-up  DOCUMENTATION CODES:   Severe malnutrition in context of acute illness/injury  INTERVENTION:  Let pt PO attempt at meals, if meal completion <50% then provide bolus tube feed via PEG: Jevity 1.2 cal formula starting at volume of 120 ml (1/2 carton/ARC) and advance volume by 120 ml at each bolus feed until goal of 356 ml (1.5 cartons/ARC) is met given TID after meals.   Additionally provide HS bolus feeds with eventual goal volume of 356 ml nightly.   Provide 45 ml Prosource TF once daily per tube.   Continue free water flushes of 150 ml q 4 hours per tube.   If all bolus tube feeds are given, tube feeds to provide 1749 kcal (80% of kcal needs), 90 grams of protein (78% of protein needs), 2068 ml free water.   Provide Ensure Enlive po TID, each supplement provides 350 kcal and 20 grams of protein.  NUTRITION DIAGNOSIS:   Severe Malnutrition related to acute illness (SDH) as evidenced by percent weight loss, moderate fat depletion, moderate muscle depletion, severe muscle depletion; ongoing  GOAL:   Patient will meet greater than or equal to 90% of their needs; progressing  MONITOR:   PO intake, Supplement acceptance, Diet advancement, Labs, Weight trends, TF tolerance, Skin, I & O's  REASON FOR ASSESSMENT:   Consult Enteral/tube feeding initiation and management  ASSESSMENT:   70 year old right-handed male with history of GERD, hyperlipidemia, urolithiasis who was recently admitted to [facility] 9/26-9/29 and then at [facility] 9/30-11/1 after mechanical fall sustaining subdural hematoma traumatic intracranial hemorrhage ventricular hemorrhage in the setting of acute head injury with occipital fracture complicated by ischemic stroke follow-up aspiration pneumonia in the setting of stroke as well as PEG tube placement 33/35 complicated by AKI with urinary retention indwelling Foley catheter tube. Presented 09/27/2021 to [facility] from skilled  nursing facility after being found  covered in vomitus as well as fever urine culture greater 100,000 Klebsiella as well as Pseudomonas with given completed course of abx. Therapy evaluations completed due to patient decreased functional mobility was admitted to CIR.  Meal completion 0-20%. PO intake remains poor. RD consulted to transition tube feeds into bolus feeds. Will allow pt to attempt to eat at meals, if po intake <50%, then provide a bolus tube feed. Additionally provide HS bolus feed nightly to ensure adequate nutrition is met while pt with poor po intake. Will continue Ensure to aid in PO.   Labs and medications reviewed.   Diet Order:   Diet Order             DIET DYS 2 Room service appropriate? Yes; Fluid consistency: Thin  Diet effective now                   EDUCATION NEEDS:   Not appropriate for education at this time  Skin:  Skin Assessment: Skin Integrity Issues: Skin Integrity Issues:: Stage II DTI: buttocks Stage II: coccyx  Last BM:  11/22  Height:   Ht Readings from Last 1 Encounters:  10/10/21 6' (1.829 m)    Weight:   Wt Readings from Last 1 Encounters:  10/18/21 76.8 kg    BMI:  Body mass index is 22.96 kg/m.  Estimated Nutritional Needs:   Kcal:  2200-2400  Protein:  115-125 grams  Fluid:  >/= 2 L/day  Corrin Parker, MS, RD, LDN RD pager number/after hours weekend pager number on Amion.

## 2021-10-19 LAB — GLUCOSE, CAPILLARY
Glucose-Capillary: 108 mg/dL — ABNORMAL HIGH (ref 70–99)
Glucose-Capillary: 121 mg/dL — ABNORMAL HIGH (ref 70–99)
Glucose-Capillary: 122 mg/dL — ABNORMAL HIGH (ref 70–99)
Glucose-Capillary: 90 mg/dL (ref 70–99)
Glucose-Capillary: 91 mg/dL (ref 70–99)
Glucose-Capillary: 98 mg/dL (ref 70–99)

## 2021-10-19 MED ORDER — OXYCODONE HCL 5 MG PO TABS
5.0000 mg | ORAL_TABLET | ORAL | Status: DC | PRN
  Administered 2021-10-19 – 2021-10-31 (×26): 5 mg
  Filled 2021-10-19 (×26): qty 1

## 2021-10-19 NOTE — Progress Notes (Signed)
Occupational Therapy Session Note  Patient Details  Name: Raymond Stuart MRN: [id number] Date of Birth: May 09, 1951  Today's Date: 10/20/2021 OT Individual Time: 1300-1345 OT Individual Time Calculation (min): 45 min   Short Term Goals: Week 1:  OT Short Term Goal 1 (Week 1): Pt will completed sit > stand with Max A x1 in prep for functional ADL OT Short Term Goal 1 - Progress (Week 1): Met OT Short Term Goal 2 (Week 1): Pt will complete UBD with mod A OT Short Term Goal 2 - Progress (Week 1): Met OT Short Term Goal 3 (Week 1): Pt will complete LBD with max A OT Short Term Goal 3 - Progress (Week 1): Progressing toward goal  Skilled Therapeutic Interventions/Progress Updates:    Pt greeted in bed with no c/o pain, just received his lunch. Per swallowing instructions, pt requires full supervision to eat and this was provided during tx. He did not want to transfer EOB or OOB to consume his meal, therefore did so with HOB elevated. He required min cues to recall his swallowing instructions and to exhibit carryover when eating. Family also brought in food from home that adhered to his D2 diet excluding saltine crackers. Pt did well with alternating solids/liquids, no s/s aspiration. Afterwards pt transitioned to EOB given close supervision assistance. Max A to don his Lt shoe but pt able to don the Rt one with supervision! Min A for sit<stand where OT assisted pt with pulling up pants and then he pivoted with the same assistance to the w/c using RW. Noted increased knee flexion in the Lt leg in stance. Pt remained sitting in the w/c at close of session, all needs within reach and safety belt fastened. Tx focus placed on ADL retraining, sit<stands, and activity tolerance.   Therapy Documentation Precautions:  Precautions Precautions: Fall Precaution Comments: g-tube Restrictions Weight Bearing Restrictions: No Vital Signs: Therapy Vitals Temp: 97.6 F (36.4 C) Temp Source: Oral Pulse Rate:  (!) 107 Resp: 18 BP: 118/89 Patient Position (if appropriate): Sitting Oxygen Therapy SpO2: 92 % O2 Device: Room Air ADL: ADL Eating: Set up (peg tube) Where Assessed-Eating: Bed level Grooming: Setup Where Assessed-Grooming: Bed level Upper Body Bathing: Moderate assistance Where Assessed-Upper Body Bathing: Edge of bed Lower Body Bathing: Maximal assistance Where Assessed-Lower Body Bathing: Edge of bed Upper Body Dressing: Minimal assistance Where Assessed-Upper Body Dressing: Edge of bed Lower Body Dressing: Maximal assistance Where Assessed-Lower Body Dressing: Edge of bed (stedy) Toileting: Maximal assistance (stedy) Where Assessed-Toileting: Bedside Commode Toilet Transfer: Maximal assistance Toilet Transfer Method: Other (comment) (stedy) Toilet Transfer Equipment: Radiographer, therapeutic: Unable to assess Tub/Shower Transfer Method: Unable to assess Social research officer, government: Unable to assess ADL Comments: Completed stand pivot with max x2 > BSC, however pt with rigidity, vestibular challenges, and benefited from [facility] transfer using stedy mod +2   Therapy/Group: Individual Therapy  Milynn Quirion A Naphtali Zywicki 10/20/2021, 3:49 PM

## 2021-10-19 NOTE — Progress Notes (Signed)
PROGRESS NOTE   Subjective/Complaints:  Pt reports buttock pain is pretty bad- can't fall asleep due to pain- Therapy going pretty well.  Oxy helps pain in buttocks-  LBM Tuesday.    ROS:  Pt denies SOB, abd pain, CP, N/V/C/D, and vision changes   Objective:   No results found. No results for input(s): WBC, HGB, HCT, PLT in the last 72 hours.  Recent Labs    10/16/21 0920 10/17/21 0503  NA 134*  --   K 4.9  --   CL 100  --   CO2 24  --   GLUCOSE 120*  --   BUN 24*  --   CREATININE 0.82 0.75  CALCIUM 9.3  --     Intake/Output Summary (Last 24 hours) at 10/19/2021 0803 Last data filed at 10/19/2021 0416 Gross per 24 hour  Intake 60 ml  Output 850 ml  Net -790 ml     Pressure Injury 09/27/21 Coccyx Medial Stage 2 -  Partial thickness loss of dermis presenting as a shallow open injury with a red, pink wound bed without slough. stage 2, 1.5X.6 (Active)  09/27/21 1300  Location: Coccyx  Location Orientation: Medial  Staging: Stage 2 -  Partial thickness loss of dermis presenting as a shallow open injury with a red, pink wound bed without slough.  Wound Description (Comments): stage 2, 1.5X.6  Present on Admission: Yes     Pressure Injury Buttocks Left;Anterior;Medial Deep Tissue Pressure Injury - Purple or maroon localized area of discolored intact skin or blood-filled blister due to damage of underlying soft tissue from pressure and/or shear. 1.5X1 inner buttock beside s (Active)     Location: Buttocks  Location Orientation: Left;Anterior;Medial  Staging: Deep Tissue Pressure Injury - Purple or maroon localized area of discolored intact skin or blood-filled blister due to damage of underlying soft tissue from pressure and/or shear.  Wound Description (Comments): 1.5X1 inner buttock beside stage 2  Present on Admission: Yes    Physical Exam: Vital Signs Blood pressure 118/78, pulse 82, temperature 98.1 F  (36.7 C), temperature source Oral, resp. rate 14, height 6' (1.829 m), weight 79 kg, SpO2 93 %.    General: awake, alert, appropriate, laying supine in bed;  NAD HENT: conjugate gaze; oropharynx moist CV: regular rate; no JVD Pulmonary: CTA B/L; no W/R/R- good air movement GI: soft, NT, ND, (+)BS; (+) PEG looks ok Psychiatric: appropriate Neurological: alert  Ext: no clubbing, cyanosis, or edema Psych: pleasant and cooperative  Skin: Left buttock pressure injury Musc: No edema in extremities.  No tenderness in extremities. Neuro: Alert, no CN abnl Motor:  RUE 4+/5 deltoid- 5-/5 otherwise,stable LUE- deltoid 4/5; otherwise 4+/5, stable RLE- 5-/5 LLE- HF 1+-2-/5; KE 2/5, DF 4-/5   Assessment/Plan: 1. Functional deficits which require 3+ hours per day of interdisciplinary therapy in a comprehensive inpatient rehab setting. Physiatrist is providing close team supervision and 24 hour management of active medical problems listed below. Physiatrist and rehab team continue to assess barriers to discharge/monitor patient progress toward functional and medical goals  Care Tool:  Bathing    Body parts bathed by patient: Right arm, Left arm, Chest, Abdomen, Right upper leg,  Left upper leg, Face   Body parts bathed by helper: Buttocks     Bathing assist Assist Level: Total Assistance - Patient < 25%     Upper Body Dressing/Undressing Upper body dressing   What is the patient wearing?: Pull over shirt    Upper body assist Assist Level: Maximal Assistance - Patient 25 - 49%    Lower Body Dressing/Undressing Lower body dressing      What is the patient wearing?: Pants     Lower body assist Assist for lower body dressing: Minimal Assistance - Patient > 75%     Toileting Toileting    Toileting assist Assist for toileting: Total Assistance - Patient < 25%     Transfers Chair/bed transfer  Transfers assist  Chair/bed transfer activity did not occur: Safety/medical  concerns  Chair/bed transfer assist level: Minimal Assistance - Patient > 75%     Locomotion Ambulation   Ambulation assist   Ambulation activity did not occur: Safety/medical concerns  Assist level: Moderate Assistance - Patient 50 - 74% Assistive device: Walker-rolling Max distance: 42ft   Walk 10 feet activity   Assist  Walk 10 feet activity did not occur: Safety/medical concerns  Assist level: Moderate Assistance - Patient - 50 - 74% Assistive device: Walker-rolling   Walk 50 feet activity   Assist Walk 50 feet with 2 turns activity did not occur: Safety/medical concerns         Walk 150 feet activity   Assist Walk 150 feet activity did not occur: Safety/medical concerns         Walk 10 feet on uneven surface  activity   Assist Walk 10 feet on uneven surfaces activity did not occur: Safety/medical concerns         Wheelchair     Assist Is the patient using a wheelchair?: Yes Type of Wheelchair: Manual Wheelchair activity did not occur: Safety/medical concerns         Wheelchair 50 feet with 2 turns activity    Assist    Wheelchair 50 feet with 2 turns activity did not occur: Safety/medical concerns       Wheelchair 150 feet activity     Assist  Wheelchair 150 feet activity did not occur: Safety/medical concerns       Blood pressure 118/78, pulse 82, temperature 98.1 F (36.7 C), temperature source Oral, resp. rate 14, height 6' (1.829 m), weight 79 kg, SpO2 93 %.    Medical Problem List and Plan: 1.  Debility secondary to sepsis/multi medical with history of traumatic subdural hematoma, traumatic intraparenchymal hemorrhage/occipital fracture, aspiration pneumonia, ischemic CVA  -Continue CIR- PT, OT and SLP 2.  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 Given history of traumatic subdural hematoma and intraparenchymal hemorrhage will continue low  dose aspirin and plan for repeat Doppler to assess propogation.  -Continue Lovenox             -antiplatelet therapy: Aspirin 81 mg daily 3. Pain Management:Voltaren gel, Lidoderm patch as directed, oxycodone as needed  Controlled on 11/22  11/24- pain is his biggest limiter per pt- will change Oxy to q4 hours prn and see if helps sleep.  4. Mood: Provide emotional support             -antipsychotic agents: N/A 5. Neuropsych: This patient is not capable of making decisions on his own behalf. 6. Left buttock pressure injury: Routine skin checks, continue wound care.  7.  Fluids/Electrolytes/Nutrition: Routine in and outs  8.  Dysphagia/gastrostomy tube.  Gastrostomy tube placed 10/27 at [facility] dysphagia #2 diet with thins, -but meds crushed in puree- can take by mouth! 11/24- will ask nursing to try and give by mouth-  9.  AKI.  Resolved 10.  Acute urinary retention/Pseudomonas Klebsiella UTI.  d/ced foley. I&O ordered PRN.  Completed course of IV Zosyn.    -urecholine increased to 10mg  qid 11/22  -beginning to void, but more often needing IC  -monitor for progress 11.  Hyperlipidemia.  Zocor 12. Thrush- continue Diflucan 200 mg x1 and 100 mg daily x 6 days and con't nystatin 13. Diarrhea- resolved, continue to monitor.  15. Dizziness with therapy: orthostatic vitals ordered, TEDs   11/23 had another episode yesterday   Will request vestibular eval   16. Decreased appetite: decrease feeding supplement to BID 17.  Hyponatremia  Sodium 133 on 11/16, 134 on 11/21, repeat weekly.  18. Sleep disturbance  Melatonin scheduled on 11/20--increase to 3mg  11/22  11/24- pain limiting sleep- changed pain meds ot help him sleep better.  19. Disposition: HFU scheduled  LOS: 9 days A FACE TO FACE EVALUATION WAS PERFORMED  Sheral Pfahler 10/19/2021, 8:03 AM

## 2021-10-20 LAB — GLUCOSE, CAPILLARY
Glucose-Capillary: 102 mg/dL — ABNORMAL HIGH (ref 70–99)
Glucose-Capillary: 145 mg/dL — ABNORMAL HIGH (ref 70–99)
Glucose-Capillary: 150 mg/dL — ABNORMAL HIGH (ref 70–99)
Glucose-Capillary: 187 mg/dL — ABNORMAL HIGH (ref 70–99)
Glucose-Capillary: 91 mg/dL (ref 70–99)
Glucose-Capillary: 96 mg/dL (ref 70–99)
Glucose-Capillary: 99 mg/dL (ref 70–99)

## 2021-10-20 MED ORDER — SORBITOL 70 % SOLN
30.0000 mL | Freq: Once | Status: DC
  Filled 2021-10-20 (×3): qty 30

## 2021-10-20 NOTE — Progress Notes (Signed)
Physical Therapy Session Note  Patient Details  Name: Raymond Stuart MRN: [id number] Date of Birth: [birth year]  Today's Date: 10/20/2021 PT Individual Time: 4462-8638 and 1771-1657 PT Individual Time Calculation (min): 54 min and 27 min  Short Term Goals: Week 1:  PT Short Term Goal 1 (Week 1): Pt will tolerate sitting in WC >2 hours between therapies PT Short Term Goal 1 - Progress (Week 1): Met PT Short Term Goal 2 (Week 1): Pt will perform bed mobility with mod assist consistently PT Short Term Goal 2 - Progress (Week 1): Met PT Short Term Goal 3 (Week 1): Pt will transfer to and from [facility] with max assist of 1. PT Short Term Goal 3 - Progress (Week 1): Met PT Short Term Goal 4 (Week 1): Pt will tolerate standing in stedy x 2 minutes with mod assist PT Short Term Goal 4 - Progress (Week 1): Met Week 2:  PT Short Term Goal 1 (Week 2): pt will transfer sit<>stand with LRAD and min A PT Short Term Goal 2 (Week 2): pt will ambulate 44f with LRAD and mod A of 1 PT Short Term Goal 3 (Week 2): pt will perform simulated car transfer with LRAD and max A of 1  Skilled Therapeutic Interventions/Progress Updates:   Treatment Session 1 Received pt semi-reclined in bed, pt agreeable to PT treatment, and reported pain 3/10 in buttocks (premedicated). Repositioning, rest breaks, and distraction done to reduce pain levels. Session with emphasis on functional mobility/transfers, dressing, toileting, ambulation, WC management, and endurance. Donned ted hose with total A and pants with supervision and increased time using reacher and via rolling L/R. Pt transferred semi-reclined<>sitting EOB with HOB elevated and use of bedrails with supervision and donned shoes with max A - denied any dizziness. Pt asked if he could bring his knee scooter from home to get on and get himself to/from the sink to brush teeth/wash face so that he doesn't have to ask for assist. Educated pt on safety concerns of knee scooter and  current need for assist with mobility but suggested trying out standard WC so that he could wheel himself around room - pt agreeable to try out standard WC. Discussed pressure relief strategies for standard WC (including WC push ups and anterior/lateral leans. Therapist provided pt with 16x16 manual WC with Roho cushion and standard legrests. Pt then reported urge to toilet and transferred sit<>stand with RW from elevated EOB with heavy min A and ambulated 821fx 2 trials with RW and min A to/from bathroom. Pt able to manage clothing standing with CGA/min A and sat down on toilet with bedside commode over top with CGA. Pt required increased time toileting but continent of bowel - charted. Pt required total A for peri-care and mod A to pull pants over hips. [facility]ous - but no emesis noted. Pt then performed WC mobility [id number] BUE and supervision through hallway with 1 90 degree turn. Educated pt on propulsion technique/stride efficiency as well as WC brake management. Concluded session with pt sitting in WC, needs within reach, and seatbelt alarm on.   Treatment Session 2 [facility] at [facility]. Pt agreeable to PT treatment, and reported pain 8/10 in buttocks. Repositioning, rest breaks, and distraction done to reduce pain levels. Session with emphasis on transfers, generalized strengthening, ambulation, and endurance. Sit<>stand with RW and min A and ambulated [id number] RW and min A prior to requesting to  sit due to feeling like LLE was going to buckle. Took extensive seated ret break but pt politely declined any further ambulation due to fatigue from previous therapies. Pt requested to stay up but wanting to try sitting somewhere other than WC - suggested recliner. Pt transferred WC<>recliner stand<>pivot with RW and min A. Placed pillow underneath buttocks per pt request and encouraged pt to alternate putting legrest up/down to change pelvic  alignment for pressure relief purposes. Concluded session with pt sitting in recliner, needs within reach, and seatbelt alarm on.   Therapy Documentation Precautions:  Precautions Precautions: Fall Precaution Comments: g-tube Restrictions Weight Bearing Restrictions: No  Therapy/Group: Individual Therapy Alfonse Alpers PT, DPT   10/20/2021, 7:36 AM

## 2021-10-20 NOTE — Progress Notes (Signed)
Speech Language Pathology Daily Session Note  Patient Details  Name: Raymond Stuart MRN: [id number] Date of Birth: 1951/10/26  Today's Date: 10/20/2021 SLP Individual Time: 1115-1200 SLP Individual Time Calculation (min): 45 min  Short Term Goals: Week 2: SLP Short Term Goal 1 (Week 2): Patient will complete higher level problem solving tasks with minA verbal and visual cues SLP Short Term Goal 2 (Week 2): Patient will complete simulated medication and/or money management tasks with min A verbal cues to achieve at least 80% accuracy SLP Short Term Goal 3 (Week 2): Patient will consistently return demonstrate to complete RMST as instructed by SLP with minA verbal cues. SLP Short Term Goal 4 (Week 2): Patient will tolerate trials of advanced, Dys 3 solids without overt s/s aspiration or penetration with supervision A for swallow safety. SLP Short Term Goal 5 (Week 2): Patient will demonstrate awareness to errors and ability to correct during complex level problem solving tasks, with minA verbal and visual cues.  Skilled Therapeutic Interventions:   Patient seen for skilled ST session focused on cognitive function goals. Patient participated in completing 'Daily Math Problems' subtest from [employer] and received a score of 90% correct (9/10). He did not require any scratch paper or calculator and his responses were prompt. Patient then discussed an issue he was having which was a previous day he thought he saw his wife's shoes outside door and then thought he saw her standing outside door when a couple of doctors were coming to his room. He was then surprised when he called out for her (after doctors had left) and she wasn't there. He called her and she told him she was at home. He became tearful talking about this. SLP discussed further with patient to continue letting staff know if he is experiencing anything that is unusual but to understand that he has not been sleeping or eating well and is on pain  medications. Patient also reported incident of him having trouble thinking of a bible verse when on phone with his brother and in agreement that this type of activity would be important to him to work on. Patient understanding and appreciative. He is in agreement to start Dys 3 solids trials but continues to report poor taste sensation of foods. He was left in bed with all needs within reach. He continues to benefit from skilled SLP intervention to maximize cognitive and swallow function goals.   Pain Pain Assessment Pain Scale: 0-10 Pain Score: 0-No pain Pain Type: Acute pain Pain Location: Buttocks Pain Descriptors / Indicators: Aching Pain Onset: On-going Pain Intervention(s): Medication (See eMAR)  Therapy/Group: Individual Therapy  Angela Nevin, MA, CCC-SLP Speech Therapy

## 2021-10-20 NOTE — Progress Notes (Signed)
Occupational Therapy Session Note  Patient Details  Name: Raymond Stuart MRN: [id number] Date of Birth: 03-23-1951  Today's Date: 10/20/2021 OT Individual Time: 1401-1430 OT Individual Time Calculation (min): 29 min    Short Term Goals: Week 1:  OT Short Term Goal 1 (Week 1): Pt will completed sit > stand with Max A x1 in prep for functional ADL OT Short Term Goal 1 - Progress (Week 1): Met OT Short Term Goal 2 (Week 1): Pt will complete UBD with mod A OT Short Term Goal 2 - Progress (Week 1): Met OT Short Term Goal 3 (Week 1): Pt will complete LBD with max A OT Short Term Goal 3 - Progress (Week 1): Progressing toward goal Week 2:  OT Short Term Goal 1 (Week 2): Pt will complete LBD with max A OT Short Term Goal 2 (Week 2): Pt will complete toileitng tasks with Max A OT Short Term Goal 3 (Week 2): Pt will maintain standing for functional task for at least 3 mins to promote increased endurance  Skilled Therapeutic Interventions/Progress Updates:  Patient met seated in wc in agreement with OT treatment session. Mild pain reported at rest and with activity in buttocks. Patient education on pressure relief. Able to return demo with good accuracy. Total A for wc transport to and from outdoor area near hospital atrium for time management. Patient completed 1 set x20 reps each of BUE HEP. Patient then completed sit to stand x2 trials from wc to RW. Session concluded with patient seated in wc with call bell within reach, brelt alarm activated and all needs met.   BUE HEP Straight arm raises Chest press Chair push-ups (x5 reps)  Therapy Documentation Precautions:  Precautions Precautions: Fall Precaution Comments: g-tube Restrictions Weight Bearing Restrictions: No General:    Therapy/Group: Individual Therapy  Eriko Economos R [place]Davis 10/20/2021, 2:02 PM

## 2021-10-20 NOTE — Progress Notes (Signed)
PROGRESS NOTE   Subjective/Complaints:  Pt reports still no BM since Tuesday- willing to try Sorbitol if no B<.  Pain much better today 4/10 currently- We discussed how if continues to use PEG to receive meds, will take a lot longer to get it out.   ROS:   Pt denies SOB, abd pain, CP, N/V/C/D, and vision changes   Objective:   No results found. No results for input(s): WBC, HGB, HCT, PLT in the last 72 hours.  No results for input(s): NA, K, CL, CO2, GLUCOSE, BUN, CREATININE, CALCIUM in the last 72 hours.   Intake/Output Summary (Last 24 hours) at 10/20/2021 1434 Last data filed at 10/20/2021 1430 Gross per 24 hour  Intake 360 ml  Output 650 ml  Net -290 ml     Pressure Injury 09/27/21 Coccyx Medial Stage 2 -  Partial thickness loss of dermis presenting as a shallow open injury with a red, pink wound bed without slough. stage 2, 1.5X.6 (Active)  09/27/21 1300  Location: Coccyx  Location Orientation: Medial  Staging: Stage 2 -  Partial thickness loss of dermis presenting as a shallow open injury with a red, pink wound bed without slough.  Wound Description (Comments): stage 2, 1.5X.6  Present on Admission: Yes     Pressure Injury Buttocks Left;Anterior;Medial Deep Tissue Pressure Injury - Purple or maroon localized area of discolored intact skin or blood-filled blister due to damage of underlying soft tissue from pressure and/or shear. 1.5X1 inner buttock beside s (Active)     Location: Buttocks  Location Orientation: Left;Anterior;Medial  Staging: Deep Tissue Pressure Injury - Purple or maroon localized area of discolored intact skin or blood-filled blister due to damage of underlying soft tissue from pressure and/or shear.  Wound Description (Comments): 1.5X1 inner buttock beside stage 2  Present on Admission: Yes    Physical Exam: Vital Signs Blood pressure 109/75, pulse 80, temperature 98.2 F (36.8 C),  temperature source Oral, resp. rate 16, height 6' (1.829 m), weight 79 kg, SpO2 95 %.     General: awake, alert, appropriate, food particles on chest; sitting up in bed; NAD HENT: conjugate gaze; oropharynx moist CV: regular rate; no JVD Pulmonary: CTA B/L; no W/R/R- good air movement GI: soft, NT, ND, (+)BS- (+) PEG in place Psychiatric: appropriate Neurological: alert Ext: no clubbing, cyanosis, or edema Psych: pleasant and cooperative  Skin: Left buttock pressure injury Musc: No edema in extremities.  No tenderness in extremities. Neuro: Alert, no CN abnl Motor:  RUE 4+/5 deltoid- 5-/5 otherwise,stable LUE- deltoid 4/5; otherwise 4+/5, stable RLE- 5-/5 LLE- HF 1+-2-/5; KE 2/5, DF 4-/5   Assessment/Plan: 1. Functional deficits which require 3+ hours per day of interdisciplinary therapy in a comprehensive inpatient rehab setting. Physiatrist is providing close team supervision and 24 hour management of active medical problems listed below. Physiatrist and rehab team continue to assess barriers to discharge/monitor patient progress toward functional and medical goals  Care Tool:  Bathing    Body parts bathed by patient: Right arm, Left arm, Chest, Abdomen, Right upper leg, Left upper leg, Face   Body parts bathed by helper: Buttocks     Bathing assist Assist Level: Total  Assistance - Patient < 25%     Upper Body Dressing/Undressing Upper body dressing   What is the patient wearing?: Pull over shirt    Upper body assist Assist Level: Maximal Assistance - Patient 25 - 49%    Lower Body Dressing/Undressing Lower body dressing      What is the patient wearing?: Pants     Lower body assist Assist for lower body dressing: Minimal Assistance - Patient > 75%     Toileting Toileting    Toileting assist Assist for toileting: Total Assistance - Patient < 25%     Transfers Chair/bed transfer  Transfers assist  Chair/bed transfer activity did not occur:  Safety/medical concerns  Chair/bed transfer assist level: Minimal Assistance - Patient > 75%     Locomotion Ambulation   Ambulation assist   Ambulation activity did not occur: Safety/medical concerns  Assist level: Moderate Assistance - Patient 50 - 74% Assistive device: Walker-rolling Max distance: 74ft   Walk 10 feet activity   Assist  Walk 10 feet activity did not occur: Safety/medical concerns  Assist level: Moderate Assistance - Patient - 50 - 74% Assistive device: Walker-rolling   Walk 50 feet activity   Assist Walk 50 feet with 2 turns activity did not occur: Safety/medical concerns         Walk 150 feet activity   Assist Walk 150 feet activity did not occur: Safety/medical concerns         Walk 10 feet on uneven surface  activity   Assist Walk 10 feet on uneven surfaces activity did not occur: Safety/medical concerns         Wheelchair     Assist Is the patient using a wheelchair?: Yes Type of Wheelchair: Manual Wheelchair activity did not occur: Safety/medical concerns  Wheelchair assist level: Supervision/Verbal cueing Max wheelchair distance: 24ft    Wheelchair 50 feet with 2 turns activity    Assist    Wheelchair 50 feet with 2 turns activity did not occur: Safety/medical concerns       Wheelchair 150 feet activity     Assist  Wheelchair 150 feet activity did not occur: Safety/medical concerns       Blood pressure 109/75, pulse 80, temperature 98.2 F (36.8 C), temperature source Oral, resp. rate 16, height 6' (1.829 m), weight 79 kg, SpO2 95 %.    Medical Problem List and Plan: 1.  Debility secondary to sepsis/multi medical with history of traumatic subdural hematoma, traumatic intraparenchymal hemorrhage/occipital fracture, aspiration pneumonia, ischemic CVA  Continue CIR- PT, OT and SLP 2.  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 Given  history of traumatic subdural hematoma and intraparenchymal hemorrhage will continue low dose aspirin and plan for repeat Doppler to assess propogation.  -Continue Lovenox             -antiplatelet therapy: Aspirin 81 mg daily 3. Pain Management:Voltaren gel, Lidoderm patch as directed, oxycodone as needed  Controlled on 11/22  11/24- pain is his biggest limiter per pt- will change Oxy to q4 hours prn and see if helps sleep.   11/25- pain much better today- 4/10- will con't regimen for now 4. Mood: Provide emotional support             -antipsychotic agents: N/A 5. Neuropsych: This patient is not capable of making decisions on his own behalf. 6. Left buttock pressure injury: Routine skin checks, continue wound care.   11/25- stable- con't wound  care- reordered it.  7. Fluids/Electrolytes/Nutrition: Routine in and outs  8.  Dysphagia/gastrostomy tube.  Gastrostomy tube placed 10/27 at [facility] dysphagia #2 diet with thins, -but meds crushed in puree- can take by mouth! 11/24- will ask nursing to try and give by mouth-   11/25- educated pt that if use for meds, will take at least 2 MORE weeks to get out- but has to have a minimum of 8 weeks from 10/27- so already had 1 month.  9.  AKI.  Resolved 10.  Acute urinary retention/Pseudomonas Klebsiella UTI.  d/ced foley. I&O ordered PRN.  Completed course of IV Zosyn.    -urecholine increased to 10mg  qid 11/22  -beginning to void, but more often needing IC  11/25- no bladder scans recorded- said peeing - con't regimen  -monitor for progress 11.  Hyperlipidemia.  Zocor 12. Thrush- continue Diflucan 200 mg x1 and 100 mg daily x 6 days and con't nystatin 13. Diarrhea- resolved, continue to monitor.  15. Dizziness with therapy: orthostatic vitals ordered, TEDs   11/23 had another episode yesterday   Will request vestibular eval   16. Decreased appetite: decrease feeding supplement to BID 17.  Hyponatremia  Sodium 133 on 11/16,  134 on 11/21, repeat weekly.  18. Sleep disturbance  Melatonin scheduled on 11/20--increase to 3mg  11/22  11/24- pain limiting sleep- changed pain meds ot help him sleep better.   11/25- pain better, so slept well last night- con't regimen 19. Disposition: HFU scheduled  LOS: 10 days A FACE TO FACE EVALUATION WAS PERFORMED  Lizzette Carbonell 10/20/2021, 2:34 PM

## 2021-10-21 LAB — GLUCOSE, CAPILLARY
Glucose-Capillary: 105 mg/dL — ABNORMAL HIGH (ref 70–99)
Glucose-Capillary: 107 mg/dL — ABNORMAL HIGH (ref 70–99)
Glucose-Capillary: 123 mg/dL — ABNORMAL HIGH (ref 70–99)
Glucose-Capillary: 144 mg/dL — ABNORMAL HIGH (ref 70–99)
Glucose-Capillary: 157 mg/dL — ABNORMAL HIGH (ref 70–99)
Glucose-Capillary: 275 mg/dL — ABNORMAL HIGH (ref 70–99)

## 2021-10-21 MED ORDER — JEVITY 1.2 CAL PO LIQD
356.0000 mL | Freq: Three times a day (TID) | ORAL | Status: DC
  Administered 2021-10-21 – 2021-10-22 (×3): 356 mL
  Administered 2021-10-23 – 2021-10-24 (×3): 237 mL
  Filled 2021-10-21 (×9): qty 474

## 2021-10-21 NOTE — Progress Notes (Signed)
Speech Language Pathology Daily Session Note  Patient Details  Name: Raymond Stuart MRN: [id number] Date of Birth: [birth year]  Today's Date: 10/21/2021 SLP Individual Time: 1005-1045 SLP Individual Time Calculation (min): 40 min  Short Term Goals: Week 2: SLP Short Term Goal 1 (Week 2): Patient will complete higher level problem solving tasks with minA verbal and visual cues SLP Short Term Goal 2 (Week 2): Patient will complete simulated medication and/or money management tasks with min A verbal cues to achieve at least 80% accuracy SLP Short Term Goal 3 (Week 2): Patient will consistently return demonstrate to complete RMST as instructed by SLP with minA verbal cues. SLP Short Term Goal 4 (Week 2): Patient will tolerate trials of advanced, Dys 3 solids without overt s/s aspiration or penetration with supervision A for swallow safety. SLP Short Term Goal 5 (Week 2): Patient will demonstrate awareness to errors and ability to correct during complex level problem solving tasks, with minA verbal and visual cues.  Skilled Therapeutic Interventions: Pt seen for skilled ST with focus on swallowing and cognition goals, pt in bed and reports having a good day. Pt reports a mild increase in appetite following 2 home cooked meals brought to him for Thanksgiving, continues to endorse food tasting bland/different. Pt demonstrates functional recall of therapies, events from previous dates and delayed recall of medical information with SPV level cues. Pt reports concerns with nightmares and dreams seeming real recently, provided emotional support and education on brain function/recovery. SLP facilitating Dys 3 trials by providing SPV level verbal cues for use of swallow strategies as trained. Pt demonstrates prolong mastication of textures and requiring liquid assistance for bolus cohesion and transit. No overt s/s aspiration. Pt states he took most of his medications PO this AM but did not like "big globs of  applesauce. It's easier just watching it go in the tube". Pt is aware of rationale behind moving meds from tube to PO. Pt left in bed with alarm set and all needs within reach. Cont ST POC.    Pain Pain Assessment Pain Scale: 0-10 Pain Score: 0-No pain  Therapy/Group: Individual Therapy  Tacey Ruiz 10/21/2021, 10:57 AM

## 2021-10-21 NOTE — Progress Notes (Signed)
PROGRESS NOTE   Subjective/Complaints: Dizziness and nausea improved Sitting upright in chair Wife at bedside. She asks if he will need TF at home and discussed that we will wean him off prior to d/c  ROS:   Pt denies SOB, abd pain, CP, N/V/C/D, and vision changes   Objective:   No results found. No results for input(s): WBC, HGB, HCT, PLT in the last 72 hours.  No results for input(s): NA, K, CL, CO2, GLUCOSE, BUN, CREATININE, CALCIUM in the last 72 hours.   Intake/Output Summary (Last 24 hours) at 10/21/2021 1359 Last data filed at 10/21/2021 1259 Gross per 24 hour  Intake 480 ml  Output 1175 ml  Net -695 ml     Pressure Injury 09/27/21 Coccyx Medial Stage 2 -  Partial thickness loss of dermis presenting as a shallow open injury with a red, pink wound bed without slough. stage 2, 1.5X.6 (Active)  09/27/21 1300  Location: Coccyx  Location Orientation: Medial  Staging: Stage 2 -  Partial thickness loss of dermis presenting as a shallow open injury with a red, pink wound bed without slough.  Wound Description (Comments): stage 2, 1.5X.6  Present on Admission: Yes     Pressure Injury Buttocks Left;Anterior;Medial Deep Tissue Pressure Injury - Purple or maroon localized area of discolored intact skin or blood-filled blister due to damage of underlying soft tissue from pressure and/or shear. 1.5X1 inner buttock beside s (Active)     Location: Buttocks  Location Orientation: Left;Anterior;Medial  Staging: Deep Tissue Pressure Injury - Purple or maroon localized area of discolored intact skin or blood-filled blister due to damage of underlying soft tissue from pressure and/or shear.  Wound Description (Comments): 1.5X1 inner buttock beside stage 2  Present on Admission: Yes    Physical Exam: Vital Signs Blood pressure 122/86, pulse (!) 106, temperature (!) 97.4 F (36.3 C), temperature source Oral, resp. rate 18,  height 6' (1.829 m), weight 78.5 kg, SpO2 95 %. General: awake, alert, appropriate, food particles on chest; sitting up in bed; NAD HENT: conjugate gaze; oropharynx moist CV: Tachycardia Pulmonary: CTA B/L; no W/R/R- good air movement GI: soft, NT, ND, (+)BS- (+) PEG in place Psychiatric: appropriate Neurological: alert Ext: no clubbing, cyanosis, or edema Psych: pleasant and cooperative  Skin: Left buttock pressure injury Musc: No edema in extremities.  No tenderness in extremities. Neuro: Alert, no CN abnl Motor:  RUE 4+/5 deltoid- 5-/5 otherwise,stable LUE- deltoid 4/5; otherwise 4+/5, stable RLE- 5-/5 LLE- HF 1+-2-/5; KE 2/5, DF 4-/5   Assessment/Plan: 1. Functional deficits which require 3+ hours per day of interdisciplinary therapy in a comprehensive inpatient rehab setting. Physiatrist is providing close team supervision and 24 hour management of active medical problems listed below. Physiatrist and rehab team continue to assess barriers to discharge/monitor patient progress toward functional and medical goals  Care Tool:  Bathing    Body parts bathed by patient: Right arm, Left arm, Chest, Abdomen, Right upper leg, Left upper leg, Face, Front perineal area   Body parts bathed by helper: Buttocks, Right lower leg, Left lower leg     Bathing assist Assist Level: Minimal Assistance - Patient > 75% (sitting with lateral  leans for washing buttocks)     Upper Body Dressing/Undressing Upper body dressing   What is the patient wearing?: Pull over shirt    Upper body assist Assist Level: Supervision/Verbal cueing    Lower Body Dressing/Undressing Lower body dressing      What is the patient wearing?: Pants, Incontinence brief     Lower body assist Assist for lower body dressing: Moderate Assistance - Patient 50 - 74%     Toileting Toileting    Toileting assist Assist for toileting: Total Assistance - Patient < 25%     Transfers Chair/bed transfer  Transfers  assist  Chair/bed transfer activity did not occur: Safety/medical concerns  Chair/bed transfer assist level: Minimal Assistance - Patient > 75%     Locomotion Ambulation   Ambulation assist   Ambulation activity did not occur: Safety/medical concerns  Assist level: Moderate Assistance - Patient 50 - 74% Assistive device: Walker-rolling Max distance: 11ft   Walk 10 feet activity   Assist  Walk 10 feet activity did not occur: Safety/medical concerns  Assist level: Moderate Assistance - Patient - 50 - 74% Assistive device: Walker-rolling   Walk 50 feet activity   Assist Walk 50 feet with 2 turns activity did not occur: Safety/medical concerns         Walk 150 feet activity   Assist Walk 150 feet activity did not occur: Safety/medical concerns         Walk 10 feet on uneven surface  activity   Assist Walk 10 feet on uneven surfaces activity did not occur: Safety/medical concerns         Wheelchair     Assist Is the patient using a wheelchair?: Yes Type of Wheelchair: Manual Wheelchair activity did not occur: Safety/medical concerns  Wheelchair assist level: Supervision/Verbal cueing Max wheelchair distance: 150'    Wheelchair 50 feet with 2 turns activity    Assist    Wheelchair 50 feet with 2 turns activity did not occur: Safety/medical concerns   Assist Level: Supervision/Verbal cueing   Wheelchair 150 feet activity     Assist  Wheelchair 150 feet activity did not occur: Safety/medical concerns   Assist Level: Supervision/Verbal cueing   Blood pressure 122/86, pulse (!) 106, temperature (!) 97.4 F (36.3 C), temperature source Oral, resp. rate 18, height 6' (1.829 m), weight 78.5 kg, SpO2 95 %.    Medical Problem List and Plan: 1.  Debility secondary to sepsis/multi medical with history of traumatic subdural hematoma, traumatic intraparenchymal hemorrhage/occipital fracture, aspiration pneumonia, ischemic CVA  Continue CIR-  PT, OT and SLP 2.  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 Given history of traumatic subdural hematoma and intraparenchymal hemorrhage will continue low dose aspirin and plan for repeat Doppler to assess propogation.  -Continue Lovenox             -antiplatelet therapy: Aspirin 81 mg daily 3. Pain Management:Voltaren gel, Lidoderm patch as directed, oxycodone as needed  Controlled on 11/22  11/24- pain is his biggest limiter per pt- will change Oxy to q4 hours prn and see if helps sleep.   11/25- pain much better today- 4/10- will con't regimen for now 4. Mood: Provide emotional support             -antipsychotic agents: N/A 5. Neuropsych: This patient is not capable of making decisions on his own behalf. 6. Left buttock pressure injury: Routine skin checks, continue wound care.   11/25-  stable- con't wound care- reordered it.  7. Fluids/Electrolytes/Nutrition: Routine in and outs  8.  Dysphagia/gastrostomy tube.  Gastrostomy tube placed 10/27 at [facility] dysphagia #2 diet with thins, -but meds crushed in puree- can take by mouth! 11/24- will ask nursing to try and give by mouth-   11/25- educated pt that if use for meds, will take at least 2 MORE weeks to get out- but has to have a minimum of 8 weeks from 10/27- so already had 1 month.  11/26: decrease TF to after meals.  9.  AKI.  Resolved 10.  Acute urinary retention/Pseudomonas Klebsiella UTI.  d/ced foley. I&O ordered PRN.  Completed course of IV Zosyn.    -urecholine increased to 10mg  qid 11/22  -beginning to void, but more often needing IC  11/25- no bladder scans recorded- said peeing - con't regimen  -monitor for progress 11.  Hyperlipidemia.  Zocor 12. Thrush- continue Diflucan 200 mg x1 and 100 mg daily x 6 days and con't nystatin 13. Diarrhea- resolved, continue to monitor.  15. Dizziness with therapy: orthostatic vitals ordered, TEDs    11/23 had another episode yesterday   Will request vestibular eval   16. Decreased appetite: decrease feeding supplement to BID 17.  Hyponatremia  Sodium 133 on 11/16, 134 on 11/21, repeat weekly.  18. Sleep disturbance  Melatonin scheduled on 11/20--increase to 3mg  11/22  11/24- pain limiting sleep- changed pain meds ot help him sleep better.   11/25- pain better, so slept well last night- con't regimen  Ordered therapeutic grounds pass to help reset circadian rhythm 19. Disposition: HFU scheduled  LOS: 11 days A FACE TO FACE EVALUATION WAS PERFORMED  12/24 Adith Tejada 10/21/2021, 1:59 PM

## 2021-10-21 NOTE — Progress Notes (Signed)
Occupational Therapy Session Note  Patient Details  Name: Latrelle Bazar MRN: [id number] Date of Birth: Nov 08, 1951  Today's Date: 10/21/2021 OT Individual Time: 6378-5885 OT Individual Time Calculation (min): 55 min    Short Term Goals: Week 2:  OT Short Term Goal 1 (Week 2): Pt will complete LBD with max A OT Short Term Goal 2 (Week 2): Pt will complete toileitng tasks with Max A OT Short Term Goal 3 (Week 2): Pt will maintain standing for functional task for at least 3 mins to promote increased endurance  Skilled Therapeutic Interventions/Progress Updates:    Pt completed shower and dressing during session per his request.  He reported no dizziness with supine to sit or with standing.  Min assist to complete sit to stand with use of the RW for support with min assist for functional mobility to the shower bench.  He was able to remove UB clothing with setup and needed mod assist to remove LB clothing sit to stand secondary to having difficulty reaching the left foot.  He was able to complete all showering in sitting with rest breaks needed secondary to fatigue.  Therapist assisted with washing his buttocks in sitting with right lateral lean secondary to fatigue.  He was able to dry off in sitting and then complete functional mobility from the bathroom to the EOB.  HR in the mid 80s with oxygen sats at 95% on room air post mobility.  He was able to completed UB dressing with supervision and then LB dressing at mod assist for donning his brief and pants.  Therapist assisted with donning his TEDs at total assist level.  Pt finished session with transition to supine with min assist for lifting his LEs into the bed.  Call button and phone in reach with safety alarm in place.  Did not get further time to re-assess dizziness but will try to see again in the near future.  It has definitely improved from last visit with this OT.     Therapy Documentation Precautions:  Precautions Precautions:  Fall Precaution Comments: g-tube Restrictions Weight Bearing Restrictions: No   Pain: Pain Assessment Pain Scale: 0-10 Pain Score: 0-No pain ADL: See Care Tool Section for some details of mobility and selfcare   Therapy/Group: Individual Therapy  Zuley Lutter OTR/L 10/21/2021, 12:08 PM

## 2021-10-21 NOTE — Progress Notes (Signed)
Physical Therapy Session Note  Patient Details  Name: Raymond Stuart MRN: [id number] Date of Birth: 12/22/1950  Today's Date: 10/21/2021 PT Individual Time: 1100-1155 PT Individual Time Calculation (min): 55 min   Short Term Goals: Week 1:  PT Short Term Goal 1 (Week 1): Pt will tolerate sitting in WC >2 hours between therapies PT Short Term Goal 1 - Progress (Week 1): Met PT Short Term Goal 2 (Week 1): Pt will perform bed mobility with mod assist consistently PT Short Term Goal 2 - Progress (Week 1): Met PT Short Term Goal 3 (Week 1): Pt will transfer to and from [facility] with max assist of 1. PT Short Term Goal 3 - Progress (Week 1): Met PT Short Term Goal 4 (Week 1): Pt will tolerate standing in stedy x 2 minutes with mod assist PT Short Term Goal 4 - Progress (Week 1): Met Week 2:  PT Short Term Goal 1 (Week 2): pt will transfer sit<>stand with LRAD and min A PT Short Term Goal 2 (Week 2): pt will ambulate 2f with LRAD and mod A of 1 PT Short Term Goal 3 (Week 2): pt will perform simulated car transfer with LRAD and max A of 1 Skilled Therapeutic Interventions/Progress Updates:  Pt received supine in bed, in pleasant mood. Pt reported 3/10 pain in buttocks and was agreeable to PT. Emphasis of session on transfers, BLE strength and gait training. Pt performed supine <>sit EOB w/CGA slowly w/heavy R trunk lean against elevated HOB. Pt performed sit <>stand from EOB and standing marches w/BUE support on RW, x4 each side w/min A for trunk support and L knee block. Stand <>sit w/CGA and after several minutes of rest, sit <>stand from EOB to RW w/light min A. Min verbal cues for quad sets in standing to facilitate knee extension, pt able to perform 6 w/CGA prior to needing to sit. Sit <>stand pivot [facility] slowly, mod verbal cues for quad activation of LLE to facilitate knee extension. [facility]. Pt verbalized feeling nauseous from propelling and dry heaved  for a few minutes but did no emesis noted. Pt transported back to room w/total A 2/2 nausea and was left seated in WC on roho cushion, family present, all needs in reach.   Therapy Documentation Precautions:  Precautions Precautions: Fall Precaution Comments: g-tube Restrictions Weight Bearing Restrictions: No   Therapy/Group: Individual Therapy JCruzita LedererPlaster, PT, DPT  10/21/2021, 7:47 AM

## 2021-10-22 LAB — GLUCOSE, CAPILLARY
Glucose-Capillary: 106 mg/dL — ABNORMAL HIGH (ref 70–99)
Glucose-Capillary: 109 mg/dL — ABNORMAL HIGH (ref 70–99)
Glucose-Capillary: 122 mg/dL — ABNORMAL HIGH (ref 70–99)
Glucose-Capillary: 126 mg/dL — ABNORMAL HIGH (ref 70–99)
Glucose-Capillary: 128 mg/dL — ABNORMAL HIGH (ref 70–99)
Glucose-Capillary: 129 mg/dL — ABNORMAL HIGH (ref 70–99)
Glucose-Capillary: 131 mg/dL — ABNORMAL HIGH (ref 70–99)

## 2021-10-23 LAB — GLUCOSE, CAPILLARY
Glucose-Capillary: 112 mg/dL — ABNORMAL HIGH (ref 70–99)
Glucose-Capillary: 116 mg/dL — ABNORMAL HIGH (ref 70–99)
Glucose-Capillary: 127 mg/dL — ABNORMAL HIGH (ref 70–99)
Glucose-Capillary: 137 mg/dL — ABNORMAL HIGH (ref 70–99)
Glucose-Capillary: 87 mg/dL (ref 70–99)
Glucose-Capillary: 95 mg/dL (ref 70–99)

## 2021-10-23 NOTE — Progress Notes (Signed)
Speech Language Pathology Daily Session Note  Patient Details  Name: Raymond Stuart MRN: [id number] Date of Birth: 11/12/1951  Today's Date: 10/23/2021 SLP Individual Time: 1346-1430 SLP Individual Time Calculation (min): 44 min  Short Term Goals: Week 2: SLP Short Term Goal 1 (Week 2): Patient will complete higher level problem solving tasks with minA verbal and visual cues SLP Short Term Goal 2 (Week 2): Patient will complete simulated medication and/or money management tasks with min A verbal cues to achieve at least 80% accuracy SLP Short Term Goal 3 (Week 2): Patient will consistently return demonstrate to complete RMST as instructed by SLP with minA verbal cues. SLP Short Term Goal 4 (Week 2): Patient will tolerate trials of advanced, Dys 3 solids without overt s/s aspiration or penetration with supervision A for swallow safety. SLP Short Term Goal 5 (Week 2): Patient will demonstrate awareness to errors and ability to correct during complex level problem solving tasks, with minA verbal and visual cues.  Skilled Therapeutic Interventions: Pt seen for skilled ST with focus on cognitive goals, wife present throughout. Pt excitedly anxious to have grounds pass to see granddaughter today. Discussed pt progress and remaninig deficits with wife in areas of cognition, speech and swallowing, pt still consuming very little PO (<25% of Dys 2 meal consumed today). Wife states pt is close to cognitive baseline and is very pleased with his progress. SLP facilitating alternating attention task by providing supervision A advancing to min A cues d/t suspected cognitive fatigue. Pt reports "getting tangled up" during higher level cognitive ask but responds very well to cueing. Pt left in bed with wife present for all needs. Cont ST POC.   Pain Pain Assessment Pain Scale: 0-10 Pain Score: 0-No pain  Therapy/Group: Individual Therapy  Tacey Ruiz 10/23/2021, 2:34 PM

## 2021-10-23 NOTE — Progress Notes (Signed)
PROGRESS NOTE   Subjective/Complaints:  Pt reports doing well- this Am- feels like had 2-3 good days lately, in a row.  Eating and sleeping better.  But admits mainly eating outside food.    ROS:   Pt denies SOB, abd pain, CP, N/V/C/D, and vision changes   Objective:   No results found. No results for input(s): WBC, HGB, HCT, PLT in the last 72 hours.  No results for input(s): NA, K, CL, CO2, GLUCOSE, BUN, CREATININE, CALCIUM in the last 72 hours.   Intake/Output Summary (Last 24 hours) at 10/23/2021 1026 Last data filed at 10/23/2021 0530 Gross per 24 hour  Intake --  Output 1250 ml  Net -1250 ml     Pressure Injury 09/27/21 Coccyx Medial Stage 2 -  Partial thickness loss of dermis presenting as a shallow open injury with a red, pink wound bed without slough. stage 2, 1.5X.6 (Active)  09/27/21 1300  Location: Coccyx  Location Orientation: Medial  Staging: Stage 2 -  Partial thickness loss of dermis presenting as a shallow open injury with a red, pink wound bed without slough.  Wound Description (Comments): stage 2, 1.5X.6  Present on Admission: Yes     Pressure Injury Buttocks Left;Anterior;Medial Deep Tissue Pressure Injury - Purple or maroon localized area of discolored intact skin or blood-filled blister due to damage of underlying soft tissue from pressure and/or shear. 1.5X1 inner buttock beside s (Active)     Location: Buttocks  Location Orientation: Left;Anterior;Medial  Staging: Deep Tissue Pressure Injury - Purple or maroon localized area of discolored intact skin or blood-filled blister due to damage of underlying soft tissue from pressure and/or shear.  Wound Description (Comments): 1.5X1 inner buttock beside stage 2  Present on Admission: Yes    Physical Exam: Vital Signs Blood pressure 108/75, pulse 79, temperature 98.3 F (36.8 C), resp. rate 18, height 6' (1.829 m), weight 78.5 kg, SpO2 92  %.    General: awake, alert, appropriate, sitting up in bed; NAD HENT: conjugate gaze; oropharynx moist CV: regular rate; no JVD Pulmonary: CTA B/L; no W/R/R- good air movement GI: soft, NT, ND, (+)BS; (+) PEG Psychiatric: appropriate; slightly flat, but interactive Neurological: alert  Ext: no clubbing, cyanosis, or edema Psych: pleasant and cooperative  Skin: Left buttock pressure injury Musc: No edema in extremities.  No tenderness in extremities. Neuro: Alert, no CN abnl Motor:  RUE 4+/5 deltoid- 5-/5 otherwise,stable LUE- deltoid 4/5; otherwise 4+/5, stable RLE- 5-/5 LLE- HF 1+-2-/5; KE 2/5, DF 4-/5   Assessment/Plan: 1. Functional deficits which require 3+ hours per day of interdisciplinary therapy in a comprehensive inpatient rehab setting. Physiatrist is providing close team supervision and 24 hour management of active medical problems listed below. Physiatrist and rehab team continue to assess barriers to discharge/monitor patient progress toward functional and medical goals  Care Tool:  Bathing    Body parts bathed by patient: Right arm, Left arm, Chest, Abdomen, Right upper leg, Left upper leg, Face, Front perineal area   Body parts bathed by helper: Buttocks, Right lower leg, Left lower leg     Bathing assist Assist Level: Minimal Assistance - Patient > 75% (sitting with lateral leans  for washing buttocks)     Upper Body Dressing/Undressing Upper body dressing   What is the patient wearing?: Pull over shirt    Upper body assist Assist Level: Supervision/Verbal cueing    Lower Body Dressing/Undressing Lower body dressing      What is the patient wearing?: Pants, Incontinence brief     Lower body assist Assist for lower body dressing: Moderate Assistance - Patient 50 - 74%     Toileting Toileting    Toileting assist Assist for toileting: Total Assistance - Patient < 25%     Transfers Chair/bed transfer  Transfers assist  Chair/bed transfer  activity did not occur: Safety/medical concerns  Chair/bed transfer assist level: Contact Guard/Touching assist     Locomotion Ambulation   Ambulation assist   Ambulation activity did not occur: Safety/medical concerns  Assist level: Contact Guard/Touching assist Assistive device: Walker-rolling Max distance: 46ft   Walk 10 feet activity   Assist  Walk 10 feet activity did not occur: Safety/medical concerns  Assist level: Contact Guard/Touching assist Assistive device: Walker-rolling   Walk 50 feet activity   Assist Walk 50 feet with 2 turns activity did not occur: Safety/medical concerns         Walk 150 feet activity   Assist Walk 150 feet activity did not occur: Safety/medical concerns         Walk 10 feet on uneven surface  activity   Assist Walk 10 feet on uneven surfaces activity did not occur: Safety/medical concerns         Wheelchair     Assist Is the patient using a wheelchair?: Yes Type of Wheelchair: Manual Wheelchair activity did not occur: Safety/medical concerns  Wheelchair assist level: Supervision/Verbal cueing Max wheelchair distance: 50    Wheelchair 50 feet with 2 turns activity    Assist    Wheelchair 50 feet with 2 turns activity did not occur: Safety/medical concerns   Assist Level: Supervision/Verbal cueing   Wheelchair 150 feet activity     Assist  Wheelchair 150 feet activity did not occur: Safety/medical concerns   Assist Level: Supervision/Verbal cueing   Blood pressure 108/75, pulse 79, temperature 98.3 F (36.8 C), resp. rate 18, height 6' (1.829 m), weight 78.5 kg, SpO2 92 %.    Medical Problem List and Plan: 1.  Debility secondary to sepsis/multi medical with history of traumatic subdural hematoma, traumatic intraparenchymal hemorrhage/occipital fracture, aspiration pneumonia, ischemic CVA  Continue CIR- PT, OT and SLP  2.  Left proximal mid femoral vein and left proximal profunda vein  DVTs, isolated as per discussion with vascular. Given history of traumatic subdural hematoma and intraparenchymal hemorrhage will continue low dose aspirin and plan for repeat Doppler to assess propogation.  -Continue Lovenox             -antiplatelet therapy: Aspirin 81 mg daily 3. Pain Management:Voltaren gel, Lidoderm patch as directed, oxycodone as needed  Controlled on 11/22  11/24- pain is his biggest limiter per pt- will change Oxy to q4 hours prn and see if helps sleep.   11/25- pain much better today- 4/10- will con't regimen for now  11/28- had 3 days in a row much better-con't reigmen 4. Mood: Provide emotional support             -antipsychotic agents: N/A 5. Neuropsych: This patient is not capable of making decisions on his own behalf. 6. Left buttock pressure injury: Routine skin checks, continue wound care.   11/25- stable- con't wound care- reordered it.  7. Fluids/Electrolytes/Nutrition: Routine in and outs  8.  Dysphagia/gastrostomy tube.  [facility]astrost[facility] 10/27 [facility] dysphagia #2 diet with thins, -but meds crushed in puree- can take by mouth! 11/24- will ask nursing to try and give by mouth-   11/25- educated pt that if use for meds, will take at least 2 MORE weeks to get out- but has to have a minimum of 8 weeks from 10/27- so already had 1 month.  11/26: decrease TF to after meals.  9.  AKI.  Resolved 10.  Acute urinary retention/Pseudomonas Klebsiella UTI.  d/ced foley. I&O ordered PRN.  Completed course of IV Zosyn.    -urecholine increased to 10mg  qid 11/22  -beginning to void, but more often needing IC  11/25- no bladder scans recorded- said peeing - con't regimen  -monitor for progress 11.  Hyperlipidemia.  Zocor 12. Thrush- continue Diflucan 200 mg x1 and 100 mg daily x 6 days and con't nystatin 13. Diarrhea- resolved, continue to monitor.  15. Dizziness with therapy: orthostatic vitals ordered, TEDs   11/23 had another episode  yesterday   Will request vestibular eval   16. Decreased appetite: decrease feeding supplement to BID  11/28- eating outside food per pt.- suggest checking prealbumin/CMP.  17.  Hyponatremia  Sodium 133 on 11/16, 134 on 11/21, repeat weekly.  18. Sleep disturbance  Melatonin scheduled on 11/20--increase to 3mg  11/22  11/24- pain limiting sleep- changed pain meds ot help him sleep better.   11/25- pain better, so slept well last night- con't regimen  Ordered therapeutic grounds pass to help reset circadian rhythm 19. Disposition: HFU scheduled  LOS: 13 days A FACE TO FACE EVALUATION WAS PERFORMED  Raymond Stuart 10/23/2021, 10:26 AM

## 2021-10-23 NOTE — Progress Notes (Signed)
Physical Therapy Session Note  Patient Details  Name: Raymond Stuart MRN: [id number] Date of Birth: July 02, 1951  Today's Date: 10/23/2021 PT Individual Time: 3754-2370 PT Individual Time Calculation (min): 55 min   Short Term Goals: Week 1:  PT Short Term Goal 1 (Week 1): Pt will tolerate sitting in WC >2 hours between therapies PT Short Term Goal 1 - Progress (Week 1): Met PT Short Term Goal 2 (Week 1): Pt will perform bed mobility with mod assist consistently PT Short Term Goal 2 - Progress (Week 1): Met PT Short Term Goal 3 (Week 1): Pt will transfer to and from [facility] with max assist of 1. PT Short Term Goal 3 - Progress (Week 1): Met PT Short Term Goal 4 (Week 1): Pt will tolerate standing in stedy x 2 minutes with mod assist PT Short Term Goal 4 - Progress (Week 1): Met Week 2:  PT Short Term Goal 1 (Week 2): pt will transfer sit<>stand with LRAD and min A PT Short Term Goal 2 (Week 2): pt will ambulate 67f with LRAD and mod A of 1 PT Short Term Goal 3 (Week 2): pt will perform simulated car transfer with LRAD and max A of 1 Week 3:     Skilled Therapeutic Interventions/Progress Updates:   Pt initially oob in wc.  Transported to 4th rehab gym for session.    UE strengthening: Seated forward press w/2lb bar 3x10              Overhead press w/2lb bar 3x10               Kayak rows x 45 sec  Bicep curls 2lb bar x 25                    4lb bar x 20  Sit to stand w/cga to RW, stood x 45 sec, 1 min w/cga, flexed posture., L knee flexed w/decreased wbing LLE - cites avoiding aggravating his knee.  Wc propulsion x 933fmod I, transported remaining distance to room d/t fatigue.   stand pivot transfer wc to bed w/RW, cga.  Sit to supine w/rail and min assist for Les. Pt left supine w/rails up x 3, alarm set, bed in lowest position, and needs in reach.  Requires mult rest breaks between sets of exercises due to decreased endurance, decreased activity tolerance.    Therapy  Documentation Precautions:  Precautions Precautions: Fall Precaution Comments: g-tube Restrictions Weight Bearing Restrictions: No   Therapy/Group: Individual Therapy BaCallie FieldingPTHeritage Hills1/28/2022, 12:15 PM

## 2021-10-23 NOTE — Progress Notes (Signed)
Physical Therapy Session Note  Patient Details  Name: Raymond Stuart MRN: [id number] Date of Birth: 01-29-1951  Today's Date: 10/23/2021 PT Individual Time: 1430-1530 PT Individual Time Calculation (min): 60 min   Short Term Goals: Week 2:  PT Short Term Goal 1 (Week 2): pt will transfer sit<>stand with LRAD and min A PT Short Term Goal 2 (Week 2): pt will ambulate 39ft with LRAD and mod A of 1 PT Short Term Goal 3 (Week 2): pt will perform simulated car transfer with LRAD and max A of 1  Skilled Therapeutic Interventions/Progress Updates: Pt presents semi-reclined in bed but agreeable to therapy.  Pt performed transfer sup to sit w/ supervision,but slowly 2/2 fear of dizziness and nausea.  Pt states using object on wall to improve performance although has not had symptoms in a week. Pt performed sit to stand w/ min A and then transfers SPT w/ min A bed > w/c. Pt wheeled x 80' w/ BUEs into hallway.  PT wheeled rest of way to [facility] to show spouse where will go to visit family.  Pt wheeled x 80' w/ PT completing remainder to upstairs gym.  Pt performed sit to stand x 2 w/ min A to perform clothes pins using B UEs.  Pt required seated rest breaks 2/2 fatigue and L knee pain.  Pt returned to room s/p request to use BR.  Pt amb x 10' into BR w/ CGA including turn to toilet.  Pt required CGA to doff clothing but min to mod A to pull up.  Pt continent of bowel and bladder in toilet, NT to chart.  Pt required completion of peri-care for cleanliness.  Pt amb to w/c and remained sitting in w/c w/spouse and guest present.       Therapy Documentation Precautions:  Precautions Precautions: Fall Precaution Comments: g-tube Restrictions Weight Bearing Restrictions: No General:   Vital Signs: Therapy Vitals Temp: 98.3 F (36.8 C) Temp Source: Oral Pulse Rate: 86 Resp: 18 BP: 103/73 Patient Position (if appropriate): Lying Oxygen Therapy SpO2: 96 % O2 Device: Room Air Pain:5/10  left knee   Pain Assessment Pain Scale: 0-10 Pain Score: 0-No pain Mobility:       Therapy/Group: Individual Therapy  Lucio Edward 10/23/2021, 3:59 PM

## 2021-10-23 NOTE — Progress Notes (Signed)
Physical Therapy Session Note  Patient Details  Name: Raymond Stuart MRN: [id number] Date of Birth: July 16, 1951  Today's Date: 10/23/2021 PT Individual Time: 0901-0942 PT Individual Time Calculation (min): 41 min   Short Term Goals: Week 1:  PT Short Term Goal 1 (Week 1): Pt will tolerate sitting in WC >2 hours between therapies PT Short Term Goal 1 - Progress (Week 1): Met PT Short Term Goal 2 (Week 1): Pt will perform bed mobility with mod assist consistently PT Short Term Goal 2 - Progress (Week 1): Met PT Short Term Goal 3 (Week 1): Pt will transfer to and from [facility] with max assist of 1. PT Short Term Goal 3 - Progress (Week 1): Met PT Short Term Goal 4 (Week 1): Pt will tolerate standing in stedy x 2 minutes with mod assist PT Short Term Goal 4 - Progress (Week 1): Met Week 2:  PT Short Term Goal 1 (Week 2): pt will transfer sit<>stand with LRAD and min A PT Short Term Goal 2 (Week 2): pt will ambulate 70f with LRAD and mod A of 1 PT Short Term Goal 3 (Week 2): pt will perform simulated car transfer with LRAD and max A of 1  Skilled Therapeutic Interventions/Progress Updates:   Received pt semi-reclined in bed, pt agreeable to PT treatment, and reported pain 3/10 in buttocks but overall much improvement - repositioning done to reduce pain levels. Session with emphasis on dressing, functional mobility/transfers, toileting, and gait training. Donned pants in supine with supervision using reacher and with increased time, via rolling L/R. Pt transferred semi-reclined<>sitting EOB with HOB elevated and use of bedrails with close supervision and donned shoes with total A. Pt reported urge to toilet and transferred sit<>stand with RW and CGA/close supervision from elevated EOB and ambulated 882fx 2 trials with RW and CGA in/out of bathroom. Pt able to manage clothing standing with CGA and required increased time for toileting - continent of bladder but no BM. Pt required total A for peri-care in  standing and max A to pull pants over hips. Took seated break due to increased nausea - but no emesis. Sit<>stand from [facility] and light min A and pt ambulated 2570f 1 with RW and CGA - limited by L knee pain and noted LLE flexed throughout, with difficulty extending. Pt politely declined any further ambulating, requesting to save energy for next PT session. Pt performed WC mobility [id number] BUE and supervision back to room. Concluded session with [facility] all needs within reach, awaiting next PT session.   Therapy Documentation Precautions:  Precautions Precautions: Fall Precaution Comments: g-tube Restrictions Weight Bearing Restrictions: No  Therapy/Group: Individual Therapy AnnaAlfonse Alpers DPT   10/23/2021, 7:21 AM

## 2021-10-24 LAB — CREATININE, SERUM
Creatinine, Ser: 0.77 mg/dL (ref 0.61–1.24)
GFR, Estimated: >60 mL/min (ref >60)

## 2021-10-24 LAB — GLUCOSE, CAPILLARY
Glucose-Capillary: 110 mg/dL — ABNORMAL HIGH (ref 70–99)
Glucose-Capillary: 103 mg/dL — ABNORMAL HIGH (ref 70–99)
Glucose-Capillary: 148 mg/dL — ABNORMAL HIGH (ref 70–99)
Glucose-Capillary: 130 mg/dL — ABNORMAL HIGH (ref 70–99)
Glucose-Capillary: 100 mg/dL — ABNORMAL HIGH (ref 70–99)

## 2021-10-24 MED ORDER — JEVITY 1.2 CAL PO LIQD
237.0000 mL | Freq: Three times a day (TID) | ORAL | Status: DC
Start: 2021-10-24 — End: 2021-10-25
  Filled 2021-10-24 (×4): qty 237

## 2021-10-24 NOTE — Progress Notes (Signed)
Occupational Therapy Session Note  Patient Details  Name: Raymond Stuart MRN: [id number] Date of Birth: 1951/08/20  Today's Date: 10/24/2021 OT Individual Time: 1300-1330 OT Individual Time Calculation (min): 30 min    Short Term Goals: Week 2:  OT Short Term Goal 1 (Week 2): Pt will complete LBD with max A OT Short Term Goal 2 (Week 2): Pt will complete toileitng tasks with Max A OT Short Term Goal 3 (Week 2): Pt will maintain standing for functional task for at least 3 mins to promote increased endurance  Skilled Therapeutic Interventions/Progress Updates:    Pt resting in w/c upon arrival. OT intervention with focus on sit<>stand, functional transfers, bed mobility, standing balance, and activity tolerance to increase independence with BADLs. Sit<>stand and functional transfers with CGA. Sit>supine with min A. Supine>sit EOB with supervision. Pt performed squats x 5 with report of increased muscle soreness in quads. Sit<>stand x 4. Pt remained in w/c with all needs within reach and belt alarm activated.   Therapy Documentation Precautions:  Precautions Precautions: Fall Precaution Comments: g-tube Restrictions Weight Bearing Restrictions: No Pain:  Pt reports 3/10 generalized pain; requested Tylenol fron LPN.   Therapy/Group: Individual Therapy  Rich Brave 10/24/2021, 2:57 PM

## 2021-10-24 NOTE — Progress Notes (Signed)
Occupational Therapy Session Note  Patient Details  Name: Raymond Stuart MRN: [id number] Date of Birth: 08/24/1951  Today's Date: 10/24/2021 OT Individual Time: 1005-1100 OT Individual Time Calculation (min): 55 min    Short Term Goals: Week 2:  OT Short Term Goal 1 (Week 2): Pt will complete LBD with max A OT Short Term Goal 2 (Week 2): Pt will complete toileitng tasks with Max A OT Short Term Goal 3 (Week 2): Pt will maintain standing for functional task for at least 3 mins to promote increased endurance  Skilled Therapeutic Interventions/Progress Updates:  Skilled OT intervention completed with focus on functional transfers to [facility], toileting self-care tasks and ADL retraining with AE education. Pt received upright in bed, agreeable to session. While seated in long sitting, pt able to donn R leg TEDS with set up A, however required mod A for donning L leg TEDs due to limited mobility/pain. Pt with request to use urinal prior to getting out of bed, with set up A, however pt with accidental spill in bed. Bed mobility completed with supervision assist for long sitting to seated EOB. Donned grip socks with total A for time, with pt request to use toilet. Pt sit > stand using RW with CGA, then ambulatory transfer to [facility] in bathroom using RW with CGA-min A. Pt able to doff pants and pull up off of hips. Pt required increased time for continent episode of BM- charted. Able to complete sit > stand from [facility] using RW with min A, with pt able to assist with posterior pericare with very forward lean demonstrated. Education provided about hand placement on RW during pericare, as well as when coming from lower surface. Overall was min A for toileting tasks this session. Ambulatory transfer with CGA using RW to EOB. Completed LB clothing donning of clean pull up and pants utilizing adaptive reacher for threading technique, with cues provided on reach placement, requiring overall min A for donning pants over hips  while in stance. Pt encouraged to transfer to w/c, for prompt PT session as well as for linens to be changed. Sit > stand and stand pivot using RW with CGA to w/c. Pt left seated in w/c, chair alarm on and all needs in reach at end of session.  Therapy Documentation Precautions:  Precautions Precautions: Fall Precaution Comments: g-tube Restrictions Weight Bearing Restrictions: No  Pain: Unrated pain in sacrum, repositioned for comfort, denied further intervention needed   Therapy/Group: Individual Therapy  [name] [name] 10/24/2021, 7:42 AM

## 2021-10-24 NOTE — Plan of Care (Signed)
  Problem: Sit to Stand Goal: LTG:  Patient will perform sit to stand in prep for activites of daily living with assistance level (OT) Description: LTG:  Patient will perform sit to stand in prep for activites of daily living with assistance level (OT) Flowsheets (Taken 10/24/2021 1624) LTG: PT will perform sit to stand in prep for activites of daily living with assistance level: (upgraded) Independent with assistive device Note: Upgraded due to increased strength, balance and decreased pain   Problem: RH Grooming Goal: LTG Patient will perform grooming w/assist,cues/equip (OT) Description: LTG: Patient will perform grooming with assist, with/without cues using equipment (OT) Flowsheets (Taken 10/24/2021 1624) LTG: Pt will perform grooming with assistance level of: (upgraded) Independent with assistive device  Note: Upgraded due to increased strength, balance and decreased pain   Problem: RH Bathing Goal: LTG Patient will bathe all body parts with assist levels (OT) Description: LTG: Patient will bathe all body parts with assist levels (OT) Flowsheets (Taken 10/24/2021 1624) LTG: Pt will perform bathing with assistance level/cueing: (upgraded) Supervision/Verbal cueing Note: Upgraded due to increased strength, balance and decreased pain   Problem: RH Dressing Goal: LTG Patient will perform upper body dressing (OT) Description: LTG Patient will perform upper body dressing with assist, with/without cues (OT). Flowsheets (Taken 10/24/2021 1624) LTG: Pt will perform upper body dressing with assistance level of: (upgraded) Independent with assistive device Note: Upgraded due to increased strength, balance and decreased pain Goal: LTG Patient will perform lower body dressing w/assist (OT) Description: LTG: Patient will perform lower body dressing with assist, with/without cues in positioning using equipment (OT) Flowsheets (Taken 10/24/2021 1624) LTG: Pt will perform lower body dressing with  assistance level of: (upgraded) Supervision/Verbal cueing Note: Upgraded due to increased strength, balance and decreased pain   Problem: RH Toileting Goal: LTG Patient will perform toileting task (3/3 steps) with assistance level (OT) Description: LTG: Patient will perform toileting task (3/3 steps) with assistance level (OT)  Flowsheets (Taken 10/24/2021 1624) LTG: Pt will perform toileting task (3/3 steps) with assistance level: (upgraded) Supervision/Verbal cueing Note: Upgraded due to increased strength, balance and decreased pain   Problem: RH Toilet Transfers Goal: LTG Patient will perform toilet transfers w/assist (OT) Description: LTG: Patient will perform toilet transfers with assist, with/without cues using equipment (OT) Flowsheets (Taken 10/24/2021 1624) LTG: Pt will perform toilet transfers with assistance level of: (upgraded) Supervision/Verbal cueing Note: Upgraded due to increased strength, balance and decreased pain   Problem: RH Tub/Shower Transfers Goal: LTG Patient will perform tub/shower transfers w/assist (OT) Description: LTG: Patient will perform tub/shower transfers with assist, with/without cues using equipment (OT) Flowsheets (Taken 10/24/2021 1624) LTG: Pt will perform tub/shower stall transfers with assistance level of: (upgraded) Supervision/Verbal cueing Note: Upgraded due to increased strength, balance and decreased pain

## 2021-10-24 NOTE — Progress Notes (Signed)
Physical Therapy Session Note  Patient Details  Name: Raymond Stuart MRN: [id number] Date of Birth: May 25, 1951  Today's Date: 10/24/2021 PT Individual Time: 1101-1156 PT Individual Time Calculation (min): 55 min   Short Term Goals: Week 1:  PT Short Term Goal 1 (Week 1): Pt will tolerate sitting in WC >2 hours between therapies PT Short Term Goal 1 - Progress (Week 1): Met PT Short Term Goal 2 (Week 1): Pt will perform bed mobility with mod assist consistently PT Short Term Goal 2 - Progress (Week 1): Met PT Short Term Goal 3 (Week 1): Pt will transfer to and from [facility] with max assist of 1. PT Short Term Goal 3 - Progress (Week 1): Met PT Short Term Goal 4 (Week 1): Pt will tolerate standing in stedy x 2 minutes with mod assist PT Short Term Goal 4 - Progress (Week 1): Met Week 2:  PT Short Term Goal 1 (Week 2): pt will transfer sit<>stand with LRAD and min A PT Short Term Goal 2 (Week 2): pt will ambulate 57f with LRAD and mod A of 1 PT Short Term Goal 3 (Week 2): pt will perform simulated car transfer with LRAD and max A of 1  Skilled Therapeutic Interventions/Progress Updates:  Received pt sitting in WC, pt agreeable to PT treatment, and reported pain in buttocks but did not state pain level. Repositioning and rest breaks done to reduce pain levels. Session with emphasis on functional mobility/transfers, generalized strengthening, dynamic standing balance/coordination, simulated car transfers, and improved activity tolerance. Pt performed WC mobility [id number] BUE and supervision to [facility] and transported remainder of way to 13M ortho gym [facility] for energy conservation purposes. Pt performed ambulatory simulated car transfer with RW and min A but required use of UEs to get BLEs into car. Pt took extensive seated rest break then ambulated 1033fn uneven surfaces (ramp) with RW and min A - cues for RW management when descending ramp. 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 Pt declined practicing due to fatigue but therapist demonstrated technique for navigating curb with RW. Pt verbalized understanding but reported he has smaller ramps that he could get set up if he needed/wanted to go in his work building but doesn't HAVE to. Therapist then demonstrated technique for navigating stairs with 1 handrail with BUE support and stepping sideways, however recommended that due to number of stairs, pt wait until his strength and endurance is further along before attempting to navigate 25 steps - pt verbalized understanding and in agreement. Pt with questions regarding hospital bed. Encouraged pt to avoid getting hospital bed, as he is currently able to perform bed mobility with supervision/mod I using bed features. Discussed that if pt truly feels he needs a rail then he could purchase a bed assist rail - pt verbalized understanding and expressed to therapist that he would really like to be able to sleep in his own bed, but that his family mentioned a hospital bed - will discuss further with family. Encouraged pt to practice bed mobility, but pt declined use to fatigue (but agreed to work on this during next session). Pt [facility] back [facility] Concluded session with pt sitting in WC, needs within reach, and seatbelt alarm on.   Therapy Documentation Precautions:  Precautions Precautions: Fall Precaution Comments: g-tube Restrictions Weight Bearing Restrictions: No  Therapy/Group: Individual Therapy  [place], DPT   10/24/2021, 7:30 AM

## 2021-10-24 NOTE — Progress Notes (Signed)
Speech Language Pathology Daily Session Note  Patient Details  Name: Raymond Stuart MRN: [id number] Date of Birth: [birth year]  Today's Date: 10/24/2021 SLP Individual Time: 1445-1545 SLP Individual Time Calculation (min): 60 min  Short Term Goals: Week 2: SLP Short Term Goal 1 (Week 2): Patient will complete higher level problem solving tasks with minA verbal and visual cues SLP Short Term Goal 2 (Week 2): Patient will complete simulated medication and/or money management tasks with min A verbal cues to achieve at least 80% accuracy SLP Short Term Goal 3 (Week 2): Patient will consistently return demonstrate to complete RMST as instructed by SLP with minA verbal cues. SLP Short Term Goal 4 (Week 2): Patient will tolerate trials of advanced, Dys 3 solids without overt s/s aspiration or penetration with supervision A for swallow safety. SLP Short Term Goal 5 (Week 2): Patient will demonstrate awareness to errors and ability to correct during complex level problem solving tasks, with minA verbal and visual cues.  Skilled Therapeutic Interventions: Pt seen for skilled ST with focus on cognitive and swallowing goals, wife and daughter present throughout. Wife brought Dys 3 texture foods to trial (cheese sandwich and pinto beans) which patient tolerated with Mod I use of swallow precautions and no overt s/s aspiration. Oral phase with mild extended time however likely due to pt caution vs weakness. Plan to trial Dys 3 trial tomorrow AM with hopes to upgrade to allow more menu options and promote PO intake which is a primary goal at this point. Pt participating in SLUMS re-assessment with a score of 28/30 (vs. 21/30 on Nov. 16) which places pt in "normal" range for cognitive function. Pt and family with no further questions at this time, pt left in wheelchair with family present for all needs. Cont ST POC.    Pain Pain Assessment Pain Scale: 0-10 Pain Score: 0-No pain  Therapy/Group: Individual  Therapy  Tacey Ruiz 10/24/2021, 3:37 PM

## 2021-10-24 NOTE — Progress Notes (Signed)
Nutrition Follow-up  DOCUMENTATION CODES:   Severe malnutrition in context of acute illness/injury  INTERVENTION:  Continue Ensure Enlive po TID, each supplement provides 350 kcal and 20 grams of protein.  Continue bolus feeds of Jevity 1.2 cal formula via PEG at 237 ml volume TID after meals.   Continue free water flushes of 150 ml q 4 hours.   Bolus tube feeds to provide 853 kcal (39% of kcal needs), 39 grams of protein (34% of protein needs), and 1476 ml free water.   Continue Juven BID.   NUTRITION DIAGNOSIS:   Severe Malnutrition related to acute illness (SDH) as evidenced by percent weight loss, moderate fat depletion, moderate muscle depletion, severe muscle depletion; ongoing  GOAL:   Patient will meet greater than or equal to 90% of their needs; progressing  MONITOR:   PO intake, Supplement acceptance, Diet advancement, Labs, Weight trends, TF tolerance, Skin, I & O's  REASON FOR ASSESSMENT:   Consult Enteral/tube feeding initiation and management  ASSESSMENT:   70 year old right-handed male with history of GERD, hyperlipidemia, urolithiasis who was recently admitted to [facility] 9/26-9/29 and then at [facility] 9/30-11/1 after mechanical fall sustaining subdural hematoma traumatic intracranial hemorrhage ventricular hemorrhage in the setting of acute head injury with occipital fracture complicated by ischemic stroke follow-up aspiration pneumonia in the setting of stroke as well as PEG tube placement 10/27 complicated by AKI with urinary retention indwelling Foley catheter tube. Presented 09/27/2021 to [facility] from skilled nursing facility after being found  covered in vomitus as well as fever urine culture greater 100,000 Klebsiella as well as Pseudomonas with given completed course of abx. Therapy evaluations completed due to patient decreased functional mobility was admitted to CIR.  PO intake and appetite improved. Meal completion 40-50%. MD has  adjusted tube feeding orders as pt po intake has improved. May continue current tube feeding orders. Pt currently has Ensure ordered and has been consuming them. RD to continue with current orders. Pt encouraged to eat his food at meals.   Labs and medications reviewed.   Diet Order:   Diet Order             DIET DYS 3 Room service appropriate? Yes; Fluid consistency: Thin  Diet effective now           DIET DYS 2 Room service appropriate? Yes; Fluid consistency: Thin  Diet effective now                   EDUCATION NEEDS:   Not appropriate for education at this time  Skin:  Skin Assessment: Reviewed RN Assessment Skin Integrity Issues:: Stage II DTI: buttocks Stage II: coccyx  Last BM:  11/28  Height:   Ht Readings from Last 1 Encounters:  10/10/21 6' (1.829 m)    Weight:   Wt Readings from Last 1 Encounters:  10/21/21 78.5 kg   BMI:  Body mass index is 23.47 kg/m.  Estimated Nutritional Needs:   Kcal:  2200-2400  Protein:  115-125 grams  Fluid:  >/= 2 L/day  Roslyn Smiling, MS, RD, LDN RD pager number/after hours weekend pager number on Amion.

## 2021-10-24 NOTE — Plan of Care (Signed)
  Problem: RH Problem Solving Goal: LTG Patient will demonstrate problem solving for (SLP) Description: LTG:  Patient will demonstrate problem solving for basic/complex daily situations with cues  (SLP) Flowsheets (Taken 10/24/2021 1203) LTG: Patient will demonstrate problem solving for (SLP): Complex daily situations LTG Patient will demonstrate problem solving for: Supervision Note: Upgrade due to functional progress   Problem: RH Awareness Goal: LTG: Patient will demonstrate awareness during functional activites type of (SLP) Description: LTG: Patient will demonstrate awareness during functional activites type of (SLP) Flowsheets (Taken 10/24/2021 1203) Patient will demonstrate during cognitive/linguistic activities awareness type of:  Emergent  Anticipatory LTG: Patient will demonstrate awareness during cognitive/linguistic activities with assistance of (SLP): Modified Independent Note: Upgraded due to functional progress

## 2021-10-24 NOTE — Progress Notes (Signed)
PROGRESS NOTE   Subjective/Complaints: Raymond Stuart has no new complaints this morning Feeling better every day Epley maneuver by Raymond Stuart really helped with his dizziness. Has had more of an appetite   ROS:   Pt denies SOB, abd pain, CP, N/V/C/D, and vision changes, appetite improving   Objective:   No results found. No results for input(s): WBC, HGB, HCT, PLT in the last 72 hours.  Recent Labs    10/24/21 0509  CREATININE 0.77     Intake/Output Summary (Last 24 hours) at 10/24/2021 1110 Last data filed at 10/24/2021 0830 Gross per 24 hour  Intake 552 ml  Output 2050 ml  Net -1498 ml     Pressure Injury 09/27/21 Coccyx Medial Stage 2 -  Partial thickness loss of dermis presenting as a shallow open injury with a red, pink wound bed without slough. stage 2, 1.5X.6 (Active)  09/27/21 1300  Location: Coccyx  Location Orientation: Medial  Staging: Stage 2 -  Partial thickness loss of dermis presenting as a shallow open injury with a red, pink wound bed without slough.  Wound Description (Comments): stage 2, 1.5X.6  Present on Admission: Yes     Pressure Injury Buttocks Left;Anterior;Medial Deep Tissue Pressure Injury - Purple or maroon localized area of discolored intact skin or blood-filled blister due to damage of underlying soft tissue from pressure and/or shear. 1.5X1 inner buttock beside s (Active)     Location: Buttocks  Location Orientation: Left;Anterior;Medial  Staging: Deep Tissue Pressure Injury - Purple or maroon localized area of discolored intact skin or blood-filled blister due to damage of underlying soft tissue from pressure and/or shear.  Wound Description (Comments): 1.5X1 inner buttock beside stage 2  Present on Admission: Yes    Physical Exam: Vital Signs Blood pressure 112/74, pulse 81, temperature 98.2 F (36.8 C), temperature source Oral, resp. rate 18, height 6' (1.829 m), weight 78.5 kg,  SpO2 95 %.    General: awake, alert, appropriate, sitting up in bed; NAD HENT: conjugate gaze; oropharynx moist CV: regular rate; no JVD Pulmonary: CTA B/L; no W/R/R- good air movement GI: soft, NT, ND, (+)BS; (+) PEG Psychiatric: jovial and pleasant Neurological: alert  Ext: no clubbing, cyanosis, or edema Psych: pleasant and cooperative  Skin: Left buttock pressure injury Musc: No edema in extremities.  No tenderness in extremities. Neuro: Alert, no CN abnl Motor:  RUE 4+/5 deltoid- 5-/5 otherwise,stable LUE- deltoid 4/5; otherwise 4+/5, stable RLE- 5-/5 LLE- HF 1+-2-/5; KE 2/5, DF 4-/5   Assessment/Plan: 1. Functional deficits which require 3+ hours per day of interdisciplinary therapy in a comprehensive inpatient rehab setting. Physiatrist is providing close team supervision and 24 hour management of active medical problems listed below. Physiatrist and rehab team continue to assess barriers to discharge/monitor patient progress toward functional and medical goals  Care Tool:  Bathing    Body parts bathed by patient: Right arm, Left arm, Chest, Abdomen, Right upper leg, Left upper leg, Face, Front perineal area   Body parts bathed by helper: Buttocks, Right lower leg, Left lower leg     Bathing assist Assist Level: Minimal Assistance - Patient > 75% (sitting with lateral leans for washing buttocks)  Upper Body Dressing/Undressing Upper body dressing   What is the patient wearing?: Pull over shirt    Upper body assist Assist Level: Supervision/Verbal cueing    Lower Body Dressing/Undressing Lower body dressing      What is the patient wearing?: Pants, Incontinence brief     Lower body assist Assist for lower body dressing: Minimal Assistance - Patient > 75%     Toileting Toileting    Toileting assist Assist for toileting: Minimal Assistance - Patient > 75%     Transfers Chair/bed transfer  Transfers assist  Chair/bed transfer activity did not  occur: Safety/medical concerns  Chair/bed transfer assist level: Minimal Assistance - Patient > 75% (w/o AD.)     Locomotion Ambulation   Ambulation assist   Ambulation activity did not occur: Safety/medical concerns  Assist level: Contact Guard/Touching assist Assistive device: Walker-rolling Max distance: 10   Walk 10 feet activity   Assist  Walk 10 feet activity did not occur: Safety/medical concerns  Assist level: Contact Guard/Touching assist Assistive device: Walker-rolling   Walk 50 feet activity   Assist Walk 50 feet with 2 turns activity did not occur: Safety/medical concerns         Walk 150 feet activity   Assist Walk 150 feet activity did not occur: Safety/medical concerns         Walk 10 feet on uneven surface  activity   Assist Walk 10 feet on uneven surfaces activity did not occur: Safety/medical concerns         Wheelchair     Assist Is the patient using a wheelchair?: Yes Type of Wheelchair: Manual Wheelchair activity did not occur: Safety/medical concerns  Wheelchair assist level: Supervision/Verbal cueing Max wheelchair distance: 80    Wheelchair 50 feet with 2 turns activity    Assist    Wheelchair 50 feet with 2 turns activity did not occur: Safety/medical concerns   Assist Level: Supervision/Verbal cueing   Wheelchair 150 feet activity     Assist  Wheelchair 150 feet activity did not occur: Safety/medical concerns   Assist Level: Supervision/Verbal cueing   Blood pressure 112/74, pulse 81, temperature 98.2 F (36.8 C), temperature source Oral, resp. rate 18, height 6' (1.829 m), weight 78.5 kg, SpO2 95 %.    Medical Problem List and Plan: 1.  Debility secondary to sepsis/multi medical with history of traumatic subdural hematoma, traumatic intraparenchymal hemorrhage/occipital fracture, aspiration pneumonia, ischemic CVA  Continue CIR- PT, OT and SLP  2.  Left proximal mid femoral vein and left  proximal profunda vein DVTs, isolated as per discussion with vascular. Given history of traumatic subdural hematoma and intraparenchymal hemorrhage will continue low dose aspirin and plan for repeat Doppler to assess propogation.  -Continue Lovenox             -antiplatelet therapy: Aspirin 81 mg daily 3. Pain Management:Voltaren gel, Lidoderm patch as directed, oxycodone as needed  Controlled on 11/22  11/24- pain is his biggest limiter per pt- will change Oxy to q4 hours prn and see if helps sleep.   11/25- pain much better today- 4/10- will con't regimen for now  11/28- had 3 days in a row much better-con't reigmen 4. Mood: Provide emotional support             -antipsychotic agents: N/A 5. Neuropsych: This patient is not capable of making decisions on his own behalf. 6. Left buttock pressure injury: Routine skin checks, continue wound care.  7. Fluids/Electrolytes/Nutrition: Routine in and outs  8.  Dysphagia/gastrostomy tube.  Gastrostomy tube placed 10/27 at [facility] dysphagia #2 diet with thins, -but meds crushed in puree- can take by mouth! 11/24- will ask nursing to try and give by mouth-   11/25- educated pt that if use for meds, will take at least 2 MORE weeks to get out- but has to have a minimum of 8 weeks from 10/27- so already had 1 month.  11/26: decrease TF to after meals.  11/29: decrease Jevity to 9.  AKI.  Resolved 10.  Acute urinary retention/Pseudomonas Klebsiella UTI.  d/ced foley. I&O ordered PRN.  Completed course of IV Zosyn.    -urecholine increased to 10mg  qid 11/22  -beginning to void, but more often needing IC  11/25- no bladder scans recorded- said peeing - con't regimen  -monitor for progress 11.  Hyperlipidemia.  Zocor 12. Thrush- continue Diflucan 200 mg x1 and 100 mg daily x 6 days and con't nystatin 13. Diarrhea- resolved, continue to monitor.  15. Dizziness with therapy: orthostatic vitals ordered, TEDs. Improved with epley  maneuver.  16. Decreased appetite: decrease feeding supplement to BID  11/28- eating outside food per pt.- suggest checking prealbumin/CMP.  17.  Hyponatremia  Sodium 133 on 11/16, 134 on 11/21, repeat weekly.  18. Sleep disturbance  Melatonin scheduled on 11/20--increase to 3mg  11/22  11/24- pain limiting sleep- changed pain meds ot help him sleep better.   11/25- pain better, so slept well last night- con't regimen  Ordered therapeutic grounds pass to help reset circadian rhythm 19. Disposition: HFU scheduled  LOS: 14 days A FACE TO FACE EVALUATION WAS PERFORMED  Raymond Stuart 10/24/2021, 11:10 AM

## 2021-10-24 NOTE — Plan of Care (Signed)
  Problem: RH Balance Goal: LTG Patient will maintain dynamic standing balance (PT) Description: LTG:  Patient will maintain dynamic standing balance with assistance during mobility activities (PT) Flowsheets (Taken 10/24/2021 0746) LTG: Pt will maintain dynamic standing balance during mobility activities with:: (upgraded due to imporved strength, endurance, and decreased pain) Independent with assistive device  Note: upgraded due to imporved strength, endurance, and decreased pain   Problem: RH Bed Mobility Goal: LTG Patient will perform bed mobility with assist (PT) Description: LTG: Patient will perform bed mobility with assistance, with/without cues (PT). Flowsheets (Taken 10/24/2021 0746) LTG: Pt will perform bed mobility with assistance level of: (upgraded due to imporved strength, endurance, and decreased pain) Independent with assistive device  Note: upgraded due to imporved strength, endurance, and decreased pain   Problem: RH Bed to Chair Transfers Goal: LTG Patient will perform bed/chair transfers w/assist (PT) Description: LTG: Patient will perform bed to chair transfers with assistance (PT). Flowsheets (Taken 10/24/2021 0746) LTG: Pt will perform Bed to Chair Transfers with assistance level: (upgraded due to imporved strength, endurance, and decreased pain) Independent with assistive device  Note: upgraded due to imporved strength, endurance, and decreased pain   Problem: RH Car Transfers Goal: LTG Patient will perform car transfers with assist (PT) Description: LTG: Patient will perform car transfers with assistance (PT). Flowsheets (Taken 10/24/2021 0746) LTG: Pt will perform car transfers with assist:: (upgraded due to imporved strength, endurance, and decreased pain) Supervision/Verbal cueing Note: upgraded due to imporved strength, endurance, and decreased pain   Problem: RH Ambulation Goal: LTG Patient will ambulate in controlled environment (PT) Description: LTG:  Patient will ambulate in a controlled environment, # of feet with assistance (PT). Flowsheets (Taken 10/24/2021 0746) LTG: Pt will ambulate in controlled environ  assist needed:: (upgraded due to imporved strength, endurance, and decreased pain) Supervision/Verbal cueing LTG: Ambulation distance in controlled environment: 176ft with LRAD Note: upgraded due to imporved strength, endurance, and decreased pain

## 2021-10-25 LAB — GLUCOSE, CAPILLARY
Glucose-Capillary: 103 mg/dL — ABNORMAL HIGH (ref 70–99)
Glucose-Capillary: 107 mg/dL — ABNORMAL HIGH (ref 70–99)
Glucose-Capillary: 111 mg/dL — ABNORMAL HIGH (ref 70–99)
Glucose-Capillary: 113 mg/dL — ABNORMAL HIGH (ref 70–99)
Glucose-Capillary: 125 mg/dL — ABNORMAL HIGH (ref 70–99)
Glucose-Capillary: 95 mg/dL (ref 70–99)

## 2021-10-25 MED ORDER — JEVITY 1.2 CAL PO LIQD
237.0000 mL | Freq: Three times a day (TID) | ORAL | Status: DC | PRN
  Administered 2021-10-25 (×2): 237 mL
  Filled 2021-10-25 (×2): qty 237

## 2021-10-25 NOTE — Progress Notes (Signed)
Occupational Therapy Session Note  Patient Details  Name: Raymond Stuart MRN: [id number] Date of Birth: 08-08-1951  Today's Date: 10/25/2021 OT Individual Time: 0920-1016 OT Individual Time Calculation (min): 56 min    Short Term Goals: Week 3:  OT Short Term Goal 1 (Week 3): STG = LTG due to ELOS  Skilled Therapeutic Interventions/Progress Updates:  Skilled OT intervention completed with focus on functional transfers, home management/DME education. Pt received semi-supine in bed, agreeable to session. Bed mobility encouraged to be completed from [facility] all the way flat as pt does not have hospital bed at home and doesn't need one at his current level. Completed supine to seated EOB with supervision assist, while using bed rail, and was educated on ease of technique via rolling then pushing up. Educated pt that bed rail could be applied to regular bed at home. Donned both shoes with total A for time. Sit > stand with CGA using RW, then stand pivot to w/c with CGA. Self-propelled in w/c with supervision for BUE endurance to [facility] therapy gym. Discussed bathroom set up and DME recommended for home use, with pt reporting he has shower chair in walk-in shower with medium lip to enter at home. Education provided about level of functional mobility needed to enter shower and the importance of using RW or method of using TTB if shower doors removed to limit standing/walking in shower. Pt reported with previous hip surgery, he would back into shower then step backwards to sit- which therapist advised against due safety concern, with safer transfer method educated on and demonstrated. Completed simulated ambulatory shower transfer using RW with threshold, via stepping over flat ankle weight, to promote managing BLE timing and sequencing for stepping over item. Required CGA for balance, with multimodal cues needed. Task graded up with pt completing alternating BLE toe taps onto orange cones using RW with min A for  balance, 2x10 (with intermittent seated rest break needed due to fatigue), to prep for stepping over higher threshold. Education provided about purpose of activity and plan to grade up task to stepping over block/wider threshold next session. Education also provided about use of RW for transfer and discussed if RW would fit going forwards if side-stepping would be needed. Self-propelled in w/c with supervision back to room. Encouraged pt to stay in chair, however desired being back in bed. Sit > stand using RW and stand pivot to EOB with CGA, then supervision for bed mobility, with pt left with HOB > 30 degrees, bed alarm on, and all needs in reach at end of session.  Therapy Documentation Precautions:  Precautions Precautions: Fall Precaution Comments: g-tube Restrictions Weight Bearing Restrictions: No  Pain: Unrated pain in L leg, soreness reported from yesterdays PT session, improved with mobility and repositioning in bed at end of session  Therapy/Group: Individual Therapy  Andres Vest E Matrice Herro 10/25/2021, 7:35 AM

## 2021-10-25 NOTE — Progress Notes (Signed)
Speech Language Pathology Weekly Progress and Session Note  Patient Details  Name: Raymond Stuart MRN: [id number] Date of Birth: 02/19/1951  Beginning of progress report period: October 18, 2021 End of progress report period: October 25, 2021  Today's Date: 10/25/2021 SLP Individual Time: 0815-0900 SLP Individual Time Calculation (min): 45 min  Short Term Goals: Week 2: SLP Short Term Goal 1 (Week 2): Patient will complete higher level problem solving tasks with minA verbal and visual cues SLP Short Term Goal 1 - Progress (Week 2): Met SLP Short Term Goal 2 (Week 2): Patient will complete simulated medication and/or money management tasks with min A verbal cues to achieve at least 80% accuracy SLP Short Term Goal 2 - Progress (Week 2): Met SLP Short Term Goal 3 (Week 2): Patient will consistently return demonstrate to complete RMST as instructed by SLP with minA verbal cues. SLP Short Term Goal 3 - Progress (Week 2): Met SLP Short Term Goal 4 (Week 2): Patient will tolerate trials of advanced, Dys 3 solids without overt s/s aspiration or penetration with supervision A for swallow safety. SLP Short Term Goal 4 - Progress (Week 2): Met SLP Short Term Goal 5 (Week 2): Patient will demonstrate awareness to errors and ability to correct during complex level problem solving tasks, with minA verbal and visual cues. SLP Short Term Goal 5 - Progress (Week 2): Met    New Short Term Goals: Week 3: SLP Short Term Goal 1 (Week 3): STG = LTG d/t ELOS  Weekly Progress Updates: Pt has made excellent progress and met 5 out of 5 short term goals this reporting period due to increased independence with recall and carryover of compensatory strategies. Pt is currently Supervision A for mildly complex cognitive tasks and LTGs have been upgraded to Mod I due to patient progress. Primary focus of tx this week has not been on voice, however pt utilizing RMST with min A-SPV and goal has been met. Pt has been  trialing Dys 3 solids with Mod I use of swallow strategies as trained, no overt s/s aspiration or difficulty. Pt PO intake has increased with more advanced textures, diet upgrade 11/30 to Dys 3/thin with intermittent supervision with goal for regular diet upgrade as appropriate. Pt re-administered SLUMS with a score of 28/30 (normal = 27+). Wife has been present for treatment sessions and educated extensively on pt progress, remaining deficits and compensatory strategies as trained. Pt will benefit from continued ST during CIR for speech, cognition and swallow function.   Intensity: Minumum of 1-2 x/day, 30 to 90 minutes Frequency: 3 to 5 out of 7 days Duration/Length of Stay: 11/04/2021 Treatment/Interventions: Cognitive remediation/compensation;Cueing hierarchy;Dysphagia/aspiration precaution training;Functional tasks;Patient/family education;Therapeutic Activities   Daily Session  Skilled Therapeutic Interventions: Pt seen for skilled ST with focus on cognitive and swallowing goals. SLP providing pt with trials of Dys 3 (cheesy scrambled eggs) and regular solids (bacon) for AM meal. Pt sitting EOB for intake, Mod I for use of swallow precautions as trained. Pt consuming with functional oral and pharyngeal phase of swallow, no overt s/s aspiration. Diet upgraded to Dys 3 with continued recommendations for Intermittent Supervision at this time. Discussed current medication administration (crushed in puree) which patient does not enjoy but is hesitant to trial whole pills at this time. He states at home he crushes many of his meds and sprinkles them on top of yogurt. Will continue to assess. Pt demonstrating functional problem solving and sequencing with Mod I during dressing task, supine to sit  and sit to supine at end of meal. SLP called pt's wife per request to discuss upgraded diet, current swallow recommendations and POC. Pt left in bed with alarm set and all needs within reach. Cont ST POC.        General    Pain Pain Assessment Pain Scale: 0-10 Pain Score: 0-No pain Pain Type: Acute pain Pain Location: Knee Pain Orientation: Left Pain Descriptors / Indicators: Aching Pain Frequency: Intermittent Pain Intervention(s): Repositioned  Therapy/Group: Individual Therapy  Raymond Stuart 10/25/2021, 8:40 AM

## 2021-10-25 NOTE — Progress Notes (Signed)
Occupational Therapy Weekly Progress Note  Patient Details  Name: Raymond Stuart MRN: [id number] Date of Birth: 1950/12/06  Beginning of progress report period: October 18, 2021 End of progress report period: October 25, 2021   Patient has met 3 of 3 short term goals.  Pt is making steady progress toward LTGs. Pt has improved his ability to bathe and dress with min A with AE, has completed functional transfers using RW including stand pivot and ambulatory shower and toilet transfers with min A-CGA. Pt's vestibular upsets have decreased, allowing him to participate to a higher degree functionally throughout sessions. Pt's awareness has improved and is motivated to work towards d/c home with his family. Therapist upgraded his OT goals from min-mod A to supervision- mod I due to steady progress. However pt continues to be limited by decreased endurance, and increased pain which limits his ability to complete all tasks at the [place] I level at this time and would benefit from continued therapy to further his independence and safety for d/c home.  Patient continues to demonstrate the following deficits: muscle weakness and muscle joint tightness, decreased cardiorespiratoy endurance, and impaired timing and sequencing and decreased motor planning and therefore will continue to benefit from skilled OT intervention to enhance overall performance with BADL, iADL, and Reduce care partner burden.  Patient progressing toward long term goals..  Continue plan of care.  OT Short Term Goals Week 2:  OT Short Term Goal 1 (Week 2): Pt will complete LBD with max A OT Short Term Goal 1 - Progress (Week 2): Met OT Short Term Goal 2 (Week 2): Pt will complete toileitng tasks with Max A OT Short Term Goal 2 - Progress (Week 2): Met OT Short Term Goal 3 (Week 2): Pt will maintain standing for functional task for at least 3 mins to promote increased endurance OT Short Term Goal 3 - Progress (Week 2): Met Week  3:  OT Short Term Goal 1 (Week 3): STG = LTG due to [facility] E Nolan Tuazon 10/25/2021, 7:37 AM

## 2021-10-25 NOTE — Progress Notes (Signed)
Occupational Therapy Session Note  Patient Details  Name: Raymond Stuart MRN: [id number] Date of Birth: [birth year]  Today's Date: 10/25/2021 OT Individual Time: 1345-1415 OT Individual Time Calculation (min): 30 min    Short Term Goals: Week 3:  OT Short Term Goal 1 (Week 3): STG = LTG due to ELOS  Skilled Therapeutic Interventions/Progress Updates:    Pt sitting EOB upon arrival with RN present for med pass. Pt sated he needed to use urinal. Sit>supine with supervision. Pt used urinal at bed level without assistance. Pt reported that he was fatigued from day's therapies. OT intervention with focus on discharge planning and bathing arrangements at home. Pt tearful when discussing family and his hospital course of care. Emotional support provided. Pt missed 15 mins skilled OT services 2/2 fatigue. Pt remained in bed with all needs within reach. Bed alarm activated.   Therapy Documentation Precautions:  Precautions Precautions: Fall Precaution Comments: g-tube Restrictions Weight Bearing Restrictions: No General: General OT Amount of Missed Time: 15 Minutes Pain: Pain Assessment Pain Scale: 0-10 Pain Score: 6    Therapy/Group: Individual Therapy  Rich Brave 10/25/2021, 2:43 PM

## 2021-10-25 NOTE — Progress Notes (Signed)
Physical Therapy Session Note  Patient Details  Name: Raymond Stuart MRN: [id number] Date of Birth: [birth year]  Today's Date: 10/25/2021 PT Individual Time: 1107-1205 PT Individual Time Calculation (min): 58 min   Short Term Goals: Week 2:  PT Short Term Goal 1 (Week 2): pt will transfer sit<>stand with LRAD and min A PT Short Term Goal 2 (Week 2): pt will ambulate 61ft with LRAD and mod A of 1 PT Short Term Goal 3 (Week 2): pt will perform simulated car transfer with LRAD and max A of 1  Skilled Therapeutic Interventions/Progress Updates:     Patient in bed upon PT arrival. Patient alert and agreeable to PT session. Patient reported 6/10 sacral pain during session, RN made aware. PT provided repositioning, rest breaks, and distraction as pain interventions throughout session.   Spent increased time discussion patient's hospital course since initial fall. 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 Patient current oriented to self, location, situation, month and year, reports difficulty keeping track of the day and time of day. Also reports difficulty sleeping with increased sensitivity to noise or lack of noise. Educated on use of white noise on the computer or with a machine or soft instrumental music to reduce Tinnitis and sensitivity to the absence of noise. Educated on overstimulation and encouraged patient to limit television and computer time to 1-2 hours at a time with breaks to reduce overstimulation. Patient very appreciative of education and able to identify experiences in the different stages of TBI recovery. Patient presents as Rancho VII with most notable deficits in semantic memory recall, per patient report.  Discussed deficits from prolonged hospital course and generalized deconditioning. Patient with most concerns about L hip, knee, and groin tightness/pain limiting functional mobility. Performed AAROM in supine,  patient with limited L hip IR to neutral and limited L hip abduction due to "pulling" pain at adductors. Patient performed supine to sit with supervision in a flat bed without use of bed rails to simulate home set-up. Provided cues for progression through side-lying and sequencing. He performed stand pivot to w/c with min A-CGA using RW.  Patient reports significant anxiety around falling and   Patient in w/c handed off to MD for rounding at end of session with breaks locked, seat belt alarm set, and all needs within reach.   Therapy Documentation Precautions:  Precautions Precautions: Fall Precaution Comments: g-tube Restrictions Weight Bearing Restrictions: No    Therapy/Group: Individual Therapy  Raihana Balderrama L Annabelle Rexroad PT, DPT  10/25/2021, 4:22 PM

## 2021-10-25 NOTE — Progress Notes (Signed)
PROGRESS NOTE   Subjective/Complaints: No new complaints this morning He would prefer to keep the Jevity until d/c and then d/c home off TF- added back with meals if he finishes <50% meals  ROS:   Pt denies SOB, abd pain, CP, N/V/C/D, and vision changes, appetite improving, +decreased appetite   Objective:   No results found. No results for input(s): WBC, HGB, HCT, PLT in the last 72 hours.  Recent Labs    10/24/21 0509  CREATININE 0.77     Intake/Output Summary (Last 24 hours) at 10/25/2021 1340 Last data filed at 10/25/2021 1147 Gross per 24 hour  Intake 357 ml  Output 800 ml  Net -443 ml     Pressure Injury 09/27/21 Coccyx Medial Stage 2 -  Partial thickness loss of dermis presenting as a shallow open injury with a red, pink wound bed without slough. stage 2, 1.5X.6 (Active)  09/27/21 1300  Location: Coccyx  Location Orientation: Medial  Staging: Stage 2 -  Partial thickness loss of dermis presenting as a shallow open injury with a red, pink wound bed without slough.  Wound Description (Comments): stage 2, 1.5X.6  Present on Admission: Yes     Pressure Injury Buttocks Left;Anterior;Medial Deep Tissue Pressure Injury - Purple or maroon localized area of discolored intact skin or blood-filled blister due to damage of underlying soft tissue from pressure and/or shear. 1.5X1 inner buttock beside s (Active)     Location: Buttocks  Location Orientation: Left;Anterior;Medial  Staging: Deep Tissue Pressure Injury - Purple or maroon localized area of discolored intact skin or blood-filled blister due to damage of underlying soft tissue from pressure and/or shear.  Wound Description (Comments): 1.5X1 inner buttock beside stage 2  Present on Admission: Yes    Physical Exam: Vital Signs Blood pressure 120/76, pulse (!) 101, temperature 98.4 F (36.9 C), temperature source Oral, resp. rate 17, height 6' (1.829  m), weight 78.1 kg, SpO2 95 %.    General: awake, alert, appropriate, sitting up in bed; NAD HENT: conjugate gaze; oropharynx moist CV: Tachycardic Pulmonary: CTA B/L; no W/R/R- good air movement GI: soft, NT, ND, (+)BS; (+) PEG Psychiatric: jovial and pleasant Neurological: alert  Ext: no clubbing, cyanosis, or edema Psych: pleasant and cooperative  Skin: Left buttock pressure injury Musc: No edema in extremities.  No tenderness in extremities. Neuro: Alert, no CN abnl Motor:  RUE 4+/5 deltoid- 5-/5 otherwise,stable LUE- deltoid 4/5; otherwise 4+/5, stable RLE- 5-/5 LLE- HF 1+-2-/5; KE 2/5, DF 4-/5   Assessment/Plan: 1. Functional deficits which require 3+ hours per day of interdisciplinary therapy in a comprehensive inpatient rehab setting. Physiatrist is providing close team supervision and 24 hour management of active medical problems listed below. Physiatrist and rehab team continue to assess barriers to discharge/monitor patient progress toward functional and medical goals  Care Tool:  Bathing    Body parts bathed by patient: Right arm, Left arm, Chest, Abdomen, Right upper leg, Left upper leg, Face, Front perineal area   Body parts bathed by helper: Buttocks, Right lower leg, Left lower leg     Bathing assist Assist Level: Minimal Assistance - Patient > 75% (sitting with lateral leans for washing  buttocks)     Upper Body Dressing/Undressing Upper body dressing   What is the patient wearing?: Pull over shirt    Upper body assist Assist Level: Supervision/Verbal cueing    Lower Body Dressing/Undressing Lower body dressing      What is the patient wearing?: Pants, Incontinence brief     Lower body assist Assist for lower body dressing: Minimal Assistance - Patient > 75%     Toileting Toileting    Toileting assist Assist for toileting: Minimal Assistance - Patient > 75%     Transfers Chair/bed transfer  Transfers assist  Chair/bed transfer activity  did not occur: Safety/medical concerns  Chair/bed transfer assist level: Contact Guard/Touching assist     Locomotion Ambulation   Ambulation assist   Ambulation activity did not occur: Safety/medical concerns  Assist level: Contact Guard/Touching assist Assistive device: Walker-rolling Max distance: 10   Walk 10 feet activity   Assist  Walk 10 feet activity did not occur: Safety/medical concerns  Assist level: Contact Guard/Touching assist Assistive device: Walker-rolling   Walk 50 feet activity   Assist Walk 50 feet with 2 turns activity did not occur: Safety/medical concerns         Walk 150 feet activity   Assist Walk 150 feet activity did not occur: Safety/medical concerns         Walk 10 feet on uneven surface  activity   Assist Walk 10 feet on uneven surfaces activity did not occur: Safety/medical concerns   Assist level: Minimal Assistance - Patient > 75% Assistive device: Walker-rolling   Wheelchair     Assist Is the patient using a wheelchair?: Yes Type of Wheelchair: Manual Wheelchair activity did not occur: Safety/medical concerns  Wheelchair assist level: Supervision/Verbal cueing Max wheelchair distance: 80    Wheelchair 50 feet with 2 turns activity    Assist    Wheelchair 50 feet with 2 turns activity did not occur: Safety/medical concerns   Assist Level: Supervision/Verbal cueing   Wheelchair 150 feet activity     Assist  Wheelchair 150 feet activity did not occur: Safety/medical concerns   Assist Level: Supervision/Verbal cueing   Blood pressure 120/76, pulse (!) 101, temperature 98.4 F (36.9 C), temperature source Oral, resp. rate 17, height 6' (1.829 m), weight 78.1 kg, SpO2 95 %.    Medical Problem List and Plan: 1.  Debility secondary to sepsis/multi medical with history of traumatic subdural hematoma, traumatic intraparenchymal hemorrhage/occipital fracture, aspiration pneumonia, ischemic  CVA  Continue CIR- PT, OT and SLP  2.  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 Given history of traumatic subdural hematoma and intraparenchymal hemorrhage will continue low dose aspirin and plan for repeat Doppler to assess propogation.  -Continue Lovenox             -antiplatelet therapy: Aspirin 81 mg daily 3. Pain Management:Voltaren gel, Lidoderm patch as directed, oxycodone as needed  Controlled on 11/22  11/24- pain is his biggest limiter per pt- will change Oxy to q4 hours prn and see if helps sleep.   11/25- pain much better today- 4/10- will con't regimen for now  11/28- had 3 days in a row much better-con't reigmen 4. Mood: Provide emotional support             -antipsychotic agents: N/A 5. Neuropsych: This patient is not capable of making decisions on his own behalf. 6. Left buttock pressure injury: Routine skin checks, continue wound care.  7. Fluids/Electrolytes/Nutrition: Routine in and outs  8.  Dysphagia/gastrostomy tube.  [facility]astrost[facility] 10/27 at [facility] dysphagia #2 diet with thins, -but meds crushed in puree- can take by mouth! 11/24- will ask nursing to try and give by mouth-   11/25- educated pt that if use for meds, will take at least 2 MORE weeks to get out- but has to have a minimum of 8 weeks from 10/27- so already had 1 month.  11/26: decrease TF to after meals.  11/29: decrease Jevity to 9.  AKI.  Resolved 10.  Acute urinary retention/Pseudomonas Klebsiella UTI.  d/ced foley. I&O ordered PRN.  Completed course of IV Zosyn.    -urecholine increased to 10mg  qid 11/22  -beginning to void, but more often needing IC  11/25- no bladder scans recorded- said peeing - con't regimen  -monitor for progress 11.  Hyperlipidemia.  Zocor 12. Thrush- continue Diflucan 200 mg x1 and 100 mg daily x 6 days and con't nystatin 13. Diarrhea- resolved, continue to monitor.   15. Dizziness with therapy: orthostatic vitals ordered, TEDs. Improved with epley maneuver.  16. Decreased appetite: decrease feeding supplement to BID. Continue Jevity supplement and d/c upon discharge 17.  Hyponatremia  Sodium 133 on 11/16, 134 on 11/21, repeat weekly.  18. Sleep disturbance  Continue melatonin  11/24- pain limiting sleep- changed pain meds ot help him sleep better.   11/25- pain better, so slept well last night- con't regimen  Ordered therapeutic grounds pass to help reset circadian rhythm 19. Tachycardia: continue to monitor HR TID 20. Disposition: HFU scheduled  LOS: 15 days A FACE TO FACE EVALUATION WAS PERFORMED  Zakkery Dorian P Gram Siedlecki 10/25/2021, 1:40 PM

## 2021-10-26 LAB — GLUCOSE, CAPILLARY
Glucose-Capillary: 122 mg/dL — ABNORMAL HIGH (ref 70–99)
Glucose-Capillary: 124 mg/dL — ABNORMAL HIGH (ref 70–99)
Glucose-Capillary: 130 mg/dL — ABNORMAL HIGH (ref 70–99)
Glucose-Capillary: 132 mg/dL — ABNORMAL HIGH (ref 70–99)
Glucose-Capillary: 84 mg/dL (ref 70–99)
Glucose-Capillary: 86 mg/dL (ref 70–99)

## 2021-10-26 MED ORDER — BETHANECHOL CHLORIDE 10 MG PO TABS
10.0000 mg | ORAL_TABLET | Freq: Three times a day (TID) | ORAL | Status: DC
Start: 2021-10-26 — End: 2021-11-02
  Administered 2021-10-26 – 2021-11-01 (×20): 10 mg
  Filled 2021-10-26 (×20): qty 1

## 2021-10-26 MED ORDER — DICLOFENAC SODIUM 1 % EX GEL
2.0000 g | Freq: Four times a day (QID) | CUTANEOUS | Status: DC | PRN
  Administered 2021-10-28: 2 g via TOPICAL
  Filled 2021-10-26: qty 100

## 2021-10-26 NOTE — Progress Notes (Signed)
Occupational Therapy Session Note  Patient Details  Name: Raymond Stuart MRN: [id number] Date of Birth: 02-18-1951  Today's Date: 10/26/2021 OT Individual Time: 7322-0254 OT Individual Time Calculation (min): 32 min    Short Term Goals: Week 3:  OT Short Term Goal 1 (Week 3): STG = LTG due to ELOS  Skilled Therapeutic Interventions/Progress Updates:    Pt resting in w/c upon arrival using urinal. Pt reported that he was tired but agreeable to therapy. Pt propelled w/c to nursing station before OTA to gym to conserve energy. Pt amb with RW from w/c in gym to NuStep and completed 7 mins at level 5. Pt reported that the exercise was extremely hard but was able to amb back to w/c. Pt returned to bed. Sit>supine with supervisoin. All amb with min A/CGA. Bed alarm activated. All needs within reach.   Therapy Documentation Precautions:  Precautions Precautions: Fall Precaution Comments: g-tube Restrictions Weight Bearing Restrictions: No Pain:  Pt c/o buttocks discomfort when sitting in w/c and on NuStep; repositioned and returned to bed at end of session   Therapy/Group: Individual Therapy  Rich Brave 10/26/2021, 1:46 PM

## 2021-10-26 NOTE — Progress Notes (Signed)
Occupational Therapy Session Note  Patient Details  Name: Raymond Stuart MRN: [id number] Date of Birth: June 24, 1951  Today's Date: 10/26/2021 OT Individual Time: 0800-0900 OT Individual Time Calculation (min): 60 min    Short Term Goals: Week 3:  OT Short Term Goal 1 (Week 3): STG = LTG due to ELOS  Skilled Therapeutic Interventions/Progress Updates:  Pt greeted  seated EOB     agreeable to OT intervention. Session focus on  BADL reeducation, functional mobility, BUE strength and endurance and incresing overall actrivity tolerance. Pt reprots just having finished donning TEDS, pt request to brush teeth, CGA for sit<>stand from EOB, CGA for ~ 5 ft of functional mobtliy from EOB>sink with RW. Pt completed standing oral care at sink with close supervision. Pt reports need to void b/b, ambulatory transfer from sink>bathroom with rw and CGA. Pt able to complete 3/3 toileting tasks with CGA, -b/b. Pt exited bathroom with rw and CGA. Pt required seated rest break post ADL ~ 5 mins, education provided during rest break about energy conservation strategies and modifying tasks as needed to accommodate for decreased activity tolerance. Pt verbalized understanding of all education. Pt completed w/c propulsion to elevators with supervision but reports fatigue with OTA taking over remainder of distance to gym. Worked  on BUE strength and endurance for higher level functional mobility tasks with pt completing below therex with 4lb dowel rod 2x10 biceps curls 2x10 chest presses Pt completed 2 mins of ball tosses with pt using 4lb dowel rod to toss ball back and forther, pt required 1 min rest break in between intervals. Pt reports fatigue post activity needing 2 min rest break, total A provided for transported back to room d/t fatigue. Pt was able to complete ~ 20 ft of w/c propulsion back to room with supervision. Pt completed stand pivot back t obed with CGA. Supervision for sit>supine. pt left supine in bed with bed  alarm activated and all needs within reach.                     Therapy Documentation Precautions:  Precautions Precautions: Fall Precaution Comments: g-tube Restrictions Weight Bearing Restrictions: No    Pain: unrated pain reported in L shoulder during therex, provided rest breaks and repositioning as needed    Therapy/Group: Individual Therapy  Pollyann Glen [facility] 10/26/2021, 9:42 AM

## 2021-10-26 NOTE — Patient Care Conference (Signed)
Inpatient RehabilitationTeam Conference and Plan of Care Update Date: 10/25/2021   Time: 11:50 AM    Patient Name: Raymond Stuart      Medical Record Number: [id number]  Date of Birth: October 11, 1951 Sex: Male         Room/Bed: 5C04C/5C04C-01 Payor Info: Payor: Multimedia programmer / Plan: [employer] MEDICARE / Product Type: *No Product type* /    Admit Date/Time:  10/10/2021  1:52 PM  Primary Diagnosis:  Sepsis due to undetermined organism [facility])  Hospital Problems: Principal Problem:   Sepsis due to undetermined organism [facility]) Active Problems:   Type 2 diabetes mellitus with hyperglycemia (HCC)   Protein-calorie malnutrition, severe   Ischemic stroke (HCC)   PEG (percutaneous endoscopic gastrostomy) status (HCC)   Sepsis (HCC)   Hyponatremia   Dyslipidemia   Oral thrush   Sleep disturbance    Expected Discharge Date: Expected Discharge Date: 11/04/21  Team Members Present: Physician leading conference: Dr. Sula Soda Social Worker Present: Lavera Guise, BSW Nurse Present: Chana Bode, RN PT Present: Merry Lofty, PT OT Present: Other (comment) [facility] [place] Adas, OT) SLP Present: Gerda Diss, SLP PPS Coordinator present : Fae Pippin, SLP     Current Status/Progress Goal Weekly Team Focus  Bowel/Bladder     Continent of bowel and bladder   Continent    Toileting  Swallow/Nutrition/ Hydration   upgraded to Dys 3 11/30  Mod I for regular diet  continue regular trials and hopeful for upgrade   ADL's   Min A bathing, Supervision UBD, Min A LBD, Min A toileting, functional transfers stand pivot/ambulatory min A-CGA with RW  Upgraded his goals to supervision/mod I  AE eduation, BUE strengthening, activity tolerance, functional transfers   Mobility   bed mobility supervision, transfers with RW CGA/min A, gait 61ft with RW min A  supervision/mod I  functional mobilty/transfers, generalized strengthening, dynamic standing balance and standing tolerance, and  imporved endurance with activity.   Communication      Mod I      Safety/Cognition/ Behavioral Observations  Supervision  upgrade to mod I  functional cog   Pain     Denies        Skin     Stage 2 on buttocks with foam and DTI healing   Skin healing; no new issues   Skin care and monitor skin q shift    Discharge Planning:  Patient discharging home with spouse and children to assist. 24/7 supervision up to Min A   Team Discussion: Dizziness addressed. Appetite better on D3 diet; bolus feeds to be discontinued and encourage po intake.  Discussion of need for and request for hospital bed; staff feel not needed functionally. Rail for bed for personal comfort although able to manage bed mobility without assistive devices.  Patient on target to meet rehab goals: yes, currently needs min assist for lower body care and supervision for upper body care. Needs min assist for toileting and CGA for transfers.   *See Care Plan and progress notes for long and short-term goals.   Revisions to Treatment Plan:  Upgraded goals   Teaching Needs: Safety, skin care, medications, secondary risk management, etc.   Current Barriers to Discharge: Decreased caregiver support  Possible Resolutions to Barriers: Family education with wife     Medical Summary Current Status: improving appetite, feeling better, dizziness resolved with the Epley maneuver, dysphagia, deconditioning, nausea improved  Barriers to Discharge: Medical stability;Wound care  Barriers to Discharge Comments: decreased appetite, dysphagia, PEG, dizziness, deconditioning Possible  Resolutions to [employer]: diet updated to dysphagia 3, discontinue Jevity, daily wound care of PEG site, continue to monitor dizziness but has greatly improved, discussed exercises he can do in bed outside of therapy time   Continued Need for Acute Rehabilitation Level of Care: The patient requires daily medical management by a physician with  specialized training in physical medicine and rehabilitation for the following reasons: Direction of a multidisciplinary physical rehabilitation program to maximize functional independence : Yes Medical management of patient stability for increased activity during participation in an intensive rehabilitation regime.: Yes Analysis of laboratory values and/or radiology reports with any subsequent need for medication adjustment and/or medical intervention. : Yes   I attest that I was present, lead the team conference, and concur with the assessment and plan of the team.   Chana Bode B 10/26/2021, 9:04 AM

## 2021-10-26 NOTE — Progress Notes (Signed)
Patient ID: Raymond Stuart, male   DOB: Apr 17, 1951, 70 y.o.   MRN: [id number] Team Conference Report to Patient/Family  Team Conference discussion was reviewed with the patient and caregiver, including goals, any changes in plan of care and target discharge date.  Patient and caregiver express understanding and are in agreement.  The patient has a target discharge date of 11/04/21.  SW spoke with pt spouse. Spouse pleased with d/c date. Spouse reports she has some bathroom items such as shower seat. Spouse reports pt has a rolling walker. No additional questions or concerns, sw will continue to follow up  Andria Rhein 10/26/2021, 1:10 PM

## 2021-10-26 NOTE — Progress Notes (Signed)
PROGRESS NOTE   Subjective/Complaints: Tolerated OT well today L shoulder pain reported during therapy.  Patient's chart reviewed- No issues reported overnight Vitals signs stable   ROS:   Pt denies SOB, abd pain, CP, N/V/C/D, and vision changes, appetite improving, +decreased appetite   Objective:   No results found. No results for input(s): WBC, HGB, HCT, PLT in the last 72 hours.  Recent Labs    10/24/21 0509  CREATININE 0.77     Intake/Output Summary (Last 24 hours) at 10/26/2021 1140 Last data filed at 10/26/2021 0700 Gross per 24 hour  Intake 1194 ml  Output 1450 ml  Net -256 ml     Pressure Injury 09/27/21 Coccyx Medial Stage 2 -  Partial thickness loss of dermis presenting as a shallow open injury with a red, pink wound bed without slough. stage 2, 1.5X.6 (Active)  09/27/21 1300  Location: Coccyx  Location Orientation: Medial  Staging: Stage 2 -  Partial thickness loss of dermis presenting as a shallow open injury with a red, pink wound bed without slough.  Wound Description (Comments): stage 2, 1.5X.6  Present on Admission: Yes     Pressure Injury Buttocks Left;Anterior;Medial Deep Tissue Pressure Injury - Purple or maroon localized area of discolored intact skin or blood-filled blister due to damage of underlying soft tissue from pressure and/or shear. 1.5X1 inner buttock beside s (Active)     Location: Buttocks  Location Orientation: Left;Anterior;Medial  Staging: Deep Tissue Pressure Injury - Purple or maroon localized area of discolored intact skin or blood-filled blister due to damage of underlying soft tissue from pressure and/or shear.  Wound Description (Comments): 1.5X1 inner buttock beside stage 2  Present on Admission: Yes    Physical Exam: Vital Signs Blood pressure 114/75, pulse 75, temperature 98.5 F (36.9 C), temperature source Oral, resp. rate 17, height 6' (1.829 m), weight 78.4 kg,  SpO2 94 %.    General: awake, alert, appropriate, sitting up in bed; NAD HENT: conjugate gaze; oropharynx moist CV: Tachycardic Pulmonary: CTA B/L; no W/R/R- good air movement GI: soft, NT, ND, (+)BS; (+) PEG Psychiatric: jovial and pleasant Neurological: alert  Ext: no clubbing, cyanosis, or edema Psych: pleasant and cooperative  Skin: Left buttock pressure injury Musc: No edema in extremities.  No tenderness in extremities. L shoulder TTP. Neuro: Alert, no CN abnl Motor:  RUE 4+/5 deltoid- 5-/5 otherwise,stable LUE- deltoid 4/5; otherwise 4+/5, stable RLE- 5-/5 LLE- HF 1+-2-/5; KE 2/5, DF 4-/5   Assessment/Plan: 1. Functional deficits which require 3+ hours per day of interdisciplinary therapy in a comprehensive inpatient rehab setting. Physiatrist is providing close team supervision and 24 hour management of active medical problems listed below. Physiatrist and rehab team continue to assess barriers to discharge/monitor patient progress toward functional and medical goals  Care Tool:  Bathing    Body parts bathed by patient: Right arm, Left arm, Chest, Abdomen, Right upper leg, Left upper leg, Face, Front perineal area   Body parts bathed by helper: Buttocks, Right lower leg, Left lower leg     Bathing assist Assist Level: Minimal Assistance - Patient > 75% (sitting with lateral leans for washing buttocks)     Upper  Body Dressing/Undressing Upper body dressing   What is the patient wearing?: Pull over shirt    Upper body assist Assist Level: Supervision/Verbal cueing    Lower Body Dressing/Undressing Lower body dressing      What is the patient wearing?: Pants, Incontinence brief     Lower body assist Assist for lower body dressing: Minimal Assistance - Patient > 75%     Toileting Toileting    Toileting assist Assist for toileting: Contact Guard/Touching assist     Transfers Chair/bed transfer  Transfers assist  Chair/bed transfer activity did not  occur: Safety/medical concerns  Chair/bed transfer assist level: Contact Guard/Touching assist     Locomotion Ambulation   Ambulation assist   Ambulation activity did not occur: Safety/medical concerns  Assist level: Contact Guard/Touching assist Assistive device: Walker-rolling Max distance: 78ft   Walk 10 feet activity   Assist  Walk 10 feet activity did not occur: Safety/medical concerns  Assist level: Contact Guard/Touching assist Assistive device: Walker-rolling   Walk 50 feet activity   Assist Walk 50 feet with 2 turns activity did not occur: Safety/medical concerns         Walk 150 feet activity   Assist Walk 150 feet activity did not occur: Safety/medical concerns         Walk 10 feet on uneven surface  activity   Assist Walk 10 feet on uneven surfaces activity did not occur: Safety/medical concerns   Assist level: Minimal Assistance - Patient > 75% Assistive device: Walker-rolling   Wheelchair     Assist Is the patient using a wheelchair?: Yes Type of Wheelchair: Manual Wheelchair activity did not occur: Safety/medical concerns  Wheelchair assist level: Supervision/Verbal cueing Max wheelchair distance: 80    Wheelchair 50 feet with 2 turns activity    Assist    Wheelchair 50 feet with 2 turns activity did not occur: Safety/medical concerns   Assist Level: Supervision/Verbal cueing   Wheelchair 150 feet activity     Assist  Wheelchair 150 feet activity did not occur: Safety/medical concerns   Assist Level: Supervision/Verbal cueing   Blood pressure 114/75, pulse 75, temperature 98.5 F (36.9 C), temperature source Oral, resp. rate 17, height 6' (1.829 m), weight 78.4 kg, SpO2 94 %.    Medical Problem List and Plan: 1.  Debility secondary to sepsis/multi medical with history of traumatic subdural hematoma, traumatic intraparenchymal hemorrhage/occipital fracture, aspiration pneumonia, ischemic CVA  Continue CIR-  PT, OT and SLP 2.  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 Given history of traumatic subdural hematoma and intraparenchymal hemorrhage will continue low dose aspirin and plan for repeat Doppler to assess propogation.  -Continue Lovenox             -antiplatelet therapy: Aspirin 81 mg daily 3. Pain: continue Voltaren gel, Lidoderm patch as directed, oxycodone as needed  Controlled on 11/22  11/24- pain is his biggest limiter per pt- will change Oxy to q4 hours prn and see if helps sleep.   11/25- pain much better today- 4/10- will con't regimen for now  11/28- had 3 days in a row much better-con't reigmen 4. Mood: Provide emotional support             -antipsychotic agents: N/A 5. Neuropsych: This patient is not capable of making decisions on his own behalf. 6. Left buttock pressure injury: Routine skin checks, continue wound care.  7. Fluids/Electrolytes/Nutrition: Routine in and outs  8.  Dysphagia/gastrostomy  tube.  [facility]astrost[facility] 10/27 at [facility] dysphagia #2 diet with thins, -but meds crushed in puree- can take by mouth! 11/24- will ask nursing to try and give by mouth-   11/25- educated pt that if use for meds, will take at least 2 MORE weeks to get out- but has to have a minimum of 8 weeks from 10/27- so already had 1 month.  11/26: decrease TF to after meals.  11/29: decrease Jevity to 9.  AKI.  Resolved 10.  Acute urinary retention/Pseudomonas Klebsiella UTI.  d/ced foley. I&O ordered PRN.  Completed course of IV Zosyn.    -urecholine increased to 10mg  qid 11/22  Improved, decrease urecholine to 10mg  TID 11.  Hyperlipidemia.  Zocor 12. Thrush- continue Diflucan 200 mg x1 and 100 mg daily x 6 days and con't nystatin 13. Diarrhea- resolved, continue to monitor.  15. Dizziness with therapy: orthostatic vitals ordered, TEDs. Improved with epley maneuver.  16. Decreased appetite:  decrease feeding supplement to BID. Continue Jevity supplement and d/c upon discharge 17.  Hyponatremia  Sodium 133 on 11/16, 134 on 11/21, repeat weekly.  18. Sleep disturbance  Continue melatonin  11/24- pain limiting sleep- changed pain meds ot help him sleep better.   11/25- pain better, so slept well last night- con't regimen  Ordered therapeutic grounds pass to help reset circadian rhythm 19. Tachycardia: continue to monitor HR TID 20. Disposition: HFU scheduled  LOS: 16 days A FACE TO FACE EVALUATION WAS PERFORMED  Rael Tilly P Sahaana Weitman 10/26/2021, 11:40 AM

## 2021-10-26 NOTE — Progress Notes (Signed)
Physical Therapy Weekly Progress Note  Patient Details  Name: Raymond Stuart MRN: [id number] Date of Birth: December 15, 1950  Beginning of progress report period: October 11, 2021 End of progress report period: October 26, 2021  Today's Date: 10/26/2021 PT Individual Time: 1000-1054 PT Individual Time Calculation (min): 54 min   Patient has met 3 of 3 short term goals. Pt demonstrates gradual progress towards long term goals. Pt is currently able to perform bed mobility with supervision, transfers with RW and CGA/min A, ambulate 82f with RW and CGA, navigate 4 steps with 2 rails and min/mod A, and perform WC mobility 1531fusing BUE and supervision. Pt continues to be limited by pain in buttocks (but has improved significantly since eval), generalized weakness/deconditioning, and decreased endurance. Pt also demonstrates improvements in BPPV symptoms, with less c/o nausea.   Patient continues to demonstrate the following deficits muscle weakness, decreased cardiorespiratoy endurance, and decreased standing balance, decreased postural control, and decreased balance strategies and therefore will continue to benefit from skilled PT intervention to increase functional independence with mobility.  Patient progressing toward long term goals..  Continue plan of care.  PT Short Term Goals Week 2:  PT Short Term Goal 1 (Week 2): pt will transfer sit<>stand with LRAD and min A PT Short Term Goal 1 - Progress (Week 2): Met PT Short Term Goal 2 (Week 2): pt will ambulate 2083fith LRAD and mod A of 1 PT Short Term Goal 2 - Progress (Week 2): Met PT Short Term Goal 3 (Week 2): pt will perform simulated car transfer with LRAD and max A of 1 PT Short Term Goal 3 - Progress (Week 2): Met Week 3:  PT Short Term Goal 1 (Week 3): STG=LTG due to LOS  Skilled Therapeutic Interventions/Progress Updates:  Ambulation/gait training;Community reintegration;DME/adaptive equipment instruction;Neuromuscular  re-education;Psychosocial support;Stair training;UE/LE Strength taining/ROM;Wheelchair propulsion/positioning;Balance/vestibular training;Discharge planning;Functional electrical stimulation;Pain management;Skin care/wound management;Therapeutic Activities;UE/LE Coordination activities;Cognitive remediation/compensation;Disease management/prevention;Functional mobility training;Patient/family education;Splinting/orthotics;Therapeutic Exercise;Visual/perceptual remediation/compensation   Today's Interventions: Received pt semi-reclined in bed, pt agreeable to PT treatment, and reported pain 5/10 in buttocks - repositioning done to reduce pain levels. Session with emphasis on functional mobility/transfers, generalized strengthening, dynamic standing balance/coordination, gait training, stair navigation, and improved activity tolerance. Pt transferred semi-reclined<>sitting EOB with HOB elevated and use of bedrails with supervision and donned shoes with max A. Stand<>pivot bed<>WC with RW and CGA and pt transported to/from room on 5C in WC [facility] for time management and energy conservation purposes. Pt navigated 4 steps with 2 rails and min/mod A ascending and descending with a step to pattern with cues for "up with the good, down with the bad" technique. Sit<>stand with RW and CGA/light min A and ambulated [id number] and 25ft55f with RW and CGA with 2 turns. Pt ambulates with L knee flexed in stance, flexed trunk with downward gaze, narrow BOS, and decreased foot clearance LLE>RLE. Pt required cues for foot postioning when stepping and turning techniuqe. Pt fatigued after ambulating and requested to work on UE strengthening. Pt performed the following exercises with supervision and verbal cues for technique: -bicep curls with 7.5lb dowel 2x10 -horizontal chest press with 2lb dowel 2x10 -overhead chest press with 2lb dowel 2x10 Concluded session with pt sitting in WC, needs within reach, and seatbelt alarm on.    Therapy Documentation Precautions:  Precautions Precautions: Fall Precaution Comments: g-tube Restrictions Weight Bearing Restrictions: No  Therapy/Group: Individual Therapy Konya Fauble Alfonse AlpersDPT  10/26/2021, 7:26 AM

## 2021-10-26 NOTE — Progress Notes (Signed)
Speech Language Pathology Daily Session Note  Patient Details  Name: Raymond Stuart MRN: [id number] Date of Birth: [birth year]  Today's Date: 10/26/2021 SLP Individual Time: 1445-1530 SLP Individual Time Calculation (min): 45 min  Short Term Goals: Week 3: SLP Short Term Goal 1 (Week 3): STG = LTG d/t ELOS  Skilled Therapeutic Interventions:   Patient seen with wife and daughter present in room for skilled ST session. Patient reported that he has been eating most of his breakfasts and about 50% of lunch however daughter in room was shaking her head. We then discussed that patient has not been eating enough to decrease bolus tube feedings. SLP discussed with patient and family and recommended they develop a list of food items he would prefer and also for patient to be more invested in calculating and keeping track of daily food intake. SLP also discussed and recommended that patient choose an activity that would be stimulating to him such as working on a bible study lesson/devotional as he has previously mentioned this as an interest. SLP did not directly state this to patient or family, but he does seem to be exhibiting possible learned helplessness from lengthy hospital stay. He continues to benefit from skilled SLP intervention to maximize cognitive function goals prior to discharge home.   Pain Pain Assessment Pain Scale: 0-10 Pain Score: 0-No pain  Therapy/Group: Individual Therapy  Angela Nevin, MA, CCC-SLP Speech Therapy

## 2021-10-27 LAB — GLUCOSE, CAPILLARY
Glucose-Capillary: 105 mg/dL — ABNORMAL HIGH (ref 70–99)
Glucose-Capillary: 107 mg/dL — ABNORMAL HIGH (ref 70–99)
Glucose-Capillary: 115 mg/dL — ABNORMAL HIGH (ref 70–99)
Glucose-Capillary: 94 mg/dL (ref 70–99)
Glucose-Capillary: 99 mg/dL (ref 70–99)
Glucose-Capillary: 99 mg/dL (ref 70–99)

## 2021-10-27 MED ORDER — MAGNESIUM GLUCONATE 500 MG PO TABS
250.0000 mg | ORAL_TABLET | Freq: Every day | ORAL | Status: DC
  Administered 2021-10-27 – 2021-11-03 (×8): 250 mg via ORAL
  Filled 2021-10-27 (×8): qty 1

## 2021-10-27 NOTE — Progress Notes (Signed)
Occupational Therapy Session Note  Patient Details  Name: Raymond Stuart MRN: [id number] Date of Birth: 12/02/1950  Today's Date: 10/27/2021 OT Individual Time: 1005-1058 OT Individual Time Calculation (min): 53 min    Short Term Goals: Week 3:  OT Short Term Goal 1 (Week 3): STG = LTG due to ELOS  Skilled Therapeutic Interventions/Progress Updates:  Skilled OT intervention completed with focus on home management education, simulated shower transfers and BUE strengthening. Pt received seated in w/c, agreeable to session. Pt self-propelled in w/c for BUE endurance to [facility] gym, with supervision. Participated in simulated shower transfer using RW forward entry with small lip to enter. Demonstration provided first. Pt completed sit > stand, then ambulatory transfer to wooden board/lip using RW, with CGA for balance, then required min A for balance and verbal cues for positioning of feet/walker over lip throughout for safety. On 2nd trial, pt able to complete with same level of assist, without cues, demonstrating safe transfers for pt's shower at home. Education provided about technique if needed to side step, hand placements for grab bars, and safety tips with wet floor. C/o pain in buttocks, with RN notified to provide pain meds, and pt opted to complete seated w/c BUE strengthening exercises due to pain. Completed the following with green theraband to promote increased strength of BUE needed for functional transfers and self care tasks: horizontal abduction x5, R bicep flexion x5. Cues required, however pt with increased pain in L shoulder, with pt reporting he asked for pain patch last night but nurses never did. Self-propelled in w/c back to room, with RN in room to administer pain meds/patch. Sit > stand using RW with CGA, then stand pivot to EOB with CGA. Completed bed mobility with HOB flat, to simulate bed at home. Pt left with HOB > 30 degrees, bed alarm on, and all needs in reach at end of  session.  Therapy Documentation Precautions:  Precautions Precautions: Fall Precaution Comments: g-tube Restrictions Weight Bearing Restrictions: No  Pain: 8/10 pain in buttocks, unrated pain in L shoulder, RN made aware and provided meds during session as well as assisted pt to bed for comfort/pressure relief   Therapy/Group: Individual Therapy  Tatisha Cerino E Jomarie Gellis 10/27/2021, 7:58 AM

## 2021-10-27 NOTE — Progress Notes (Signed)
PROGRESS NOTE   Subjective/Complaints: Having difficulty sleeping at night- usually someone comes in room at 4am to check CBG, but he feels he is already awake and watching the clock by then  ROS:   Pt denies SOB, abd pain, CP, N/V/C/D, and vision changes, appetite improving, +insomnia   Objective:   No results found. No results for input(s): WBC, HGB, HCT, PLT in the last 72 hours.  No results for input(s): NA, K, CL, CO2, GLUCOSE, BUN, CREATININE, CALCIUM in the last 72 hours.    Intake/Output Summary (Last 24 hours) at 10/27/2021 1016 Last data filed at 10/27/2021 0700 Gross per 24 hour  Intake 750 ml  Output 900 ml  Net -150 ml     Pressure Injury 09/27/21 Coccyx Medial Stage 2 -  Partial thickness loss of dermis presenting as a shallow open injury with a red, pink wound bed without slough. stage 2, 1.5X.6 (Active)  09/27/21 1300  Location: Coccyx  Location Orientation: Medial  Staging: Stage 2 -  Partial thickness loss of dermis presenting as a shallow open injury with a red, pink wound bed without slough.  Wound Description (Comments): stage 2, 1.5X.6  Present on Admission: Yes     Pressure Injury Buttocks Left;Anterior;Medial Deep Tissue Pressure Injury - Purple or maroon localized area of discolored intact skin or blood-filled blister due to damage of underlying soft tissue from pressure and/or shear. 1.5X1 inner buttock beside s (Active)     Location: Buttocks  Location Orientation: Left;Anterior;Medial  Staging: Deep Tissue Pressure Injury - Purple or maroon localized area of discolored intact skin or blood-filled blister due to damage of underlying soft tissue from pressure and/or shear.  Wound Description (Comments): 1.5X1 inner buttock beside stage 2  Present on Admission: Yes    Physical Exam: Vital Signs Blood pressure 110/61, pulse 76, temperature 98.2 F (36.8 C), resp. rate 14, height 6' (1.829  m), weight 77.1 kg, SpO2 95 %.    General: awake, alert, appropriate, sitting up in bed; NAD HENT: conjugate gaze; oropharynx moist CV: Tachycardic Pulmonary: CTA B/L; no W/R/R- good air movement GI: soft, NT, ND, (+)BS; (+) PEG Psychiatric: jovial and pleasant Neurological: alert  Ext: no clubbing, cyanosis, or edema Psych: pleasant and cooperative  Skin: Left buttock pressure injury Musc: No edema in extremities.  No tenderness in extremities. L shoulder TTP. Neuro: Alert, no CN abnl, cognitively sharp and expressed good understanding and carryover Motor:  RUE 4+/5 deltoid- 5-/5 otherwise,stable LUE- deltoid 4/5; otherwise 4+/5, stable RLE- 5-/5 LLE- HF 1+-2-/5; KE 2/5, DF 4-/5   Assessment/Plan: 1. Functional deficits which require 3+ hours per day of interdisciplinary therapy in a comprehensive inpatient rehab setting. Physiatrist is providing close team supervision and 24 hour management of active medical problems listed below. Physiatrist and rehab team continue to assess barriers to discharge/monitor patient progress toward functional and medical goals  Care Tool:  Bathing    Body parts bathed by patient: Right arm, Left arm, Chest, Abdomen, Right upper leg, Left upper leg, Face, Front perineal area   Body parts bathed by helper: Buttocks, Right lower leg, Left lower leg     Bathing assist Assist Level: Minimal Assistance -  Patient > 75% (sitting with lateral leans for washing buttocks)     Upper Body Dressing/Undressing Upper body dressing   What is the patient wearing?: Pull over shirt    Upper body assist Assist Level: Supervision/Verbal cueing    Lower Body Dressing/Undressing Lower body dressing      What is the patient wearing?: Pants, Incontinence brief     Lower body assist Assist for lower body dressing: Minimal Assistance - Patient > 75%     Toileting Toileting    Toileting assist Assist for toileting: Contact Guard/Touching assist      Transfers Chair/bed transfer  Transfers assist  Chair/bed transfer activity did not occur: Safety/medical concerns  Chair/bed transfer assist level: Contact Guard/Touching assist     Locomotion Ambulation   Ambulation assist   Ambulation activity did not occur: Safety/medical concerns  Assist level: Contact Guard/Touching assist Assistive device: Walker-rolling Max distance: 79ft   Walk 10 feet activity   Assist  Walk 10 feet activity did not occur: Safety/medical concerns  Assist level: Contact Guard/Touching assist Assistive device: Walker-rolling   Walk 50 feet activity   Assist Walk 50 feet with 2 turns activity did not occur: Safety/medical concerns  Assist level: Contact Guard/Touching assist Assistive device: Walker-rolling    Walk 150 feet activity   Assist Walk 150 feet activity did not occur: Safety/medical concerns         Walk 10 feet on uneven surface  activity   Assist Walk 10 feet on uneven surfaces activity did not occur: Safety/medical concerns   Assist level: Minimal Assistance - Patient > 75% Assistive device: Development worker, international aid     Assist Is the patient using a wheelchair?: Yes Type of Wheelchair: Manual Wheelchair activity did not occur: Safety/medical concerns  Wheelchair assist level: Supervision/Verbal cueing Max wheelchair distance: 80    Wheelchair 50 feet with 2 turns activity    Assist    Wheelchair 50 feet with 2 turns activity did not occur: Safety/medical concerns   Assist Level: Supervision/Verbal cueing   Wheelchair 150 feet activity     Assist  Wheelchair 150 feet activity did not occur: Safety/medical concerns   Assist Level: Supervision/Verbal cueing   Blood pressure 110/61, pulse 76, temperature 98.2 F (36.8 C), resp. rate 14, height 6' (1.829 m), weight 77.1 kg, SpO2 95 %.    Medical Problem List and Plan: 1.  Debility secondary to sepsis/multi medical with history of  traumatic subdural hematoma, traumatic intraparenchymal hemorrhage/occipital fracture, aspiration pneumonia, ischemic CVA  Continue CIR- PT, OT and SLP 2.  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 Given history of traumatic subdural hematoma and intraparenchymal hemorrhage will continue low dose aspirin and plan for repeat Doppler to assess propogation.  -Continue Lovenox             -antiplatelet therapy: Aspirin 81 mg daily 3. Pain: continue Voltaren gel, Lidoderm patch as directed, oxycodone as needed  Controlled on 11/22  11/24- pain is his biggest limiter per pt- will change Oxy to q4 hours prn and see if helps sleep.  4. Mood: Provide emotional support             -antipsychotic agents: N/A 5. Neuropsych: This patient is not capable of making decisions on his own behalf. 6. Left buttock pressure injury: Routine skin checks, continue wound care.  7. Fluids/Electrolytes/Nutrition: Routine in and outs  8.  Dysphagia/gastrostomy tube.  Gastrostomy tube placed 10/27 at  [facility] dysphagia #2 diet with thins, -but meds crushed in puree- can take by mouth! 11/24- will ask nursing to try and give by mouth-   11/25- educated pt that if use for meds, will take at least 2 MORE weeks to get out- but has to have a minimum of 8 weeks from 10/27- so already had 1 month.  11/26: decrease TF to after meals.  11/29: decrease Jevity to 9.  AKI.  Resolved 10.  Acute urinary retention/Pseudomonas Klebsiella UTI.  d/ced foley. I&O ordered PRN.  Completed course of IV Zosyn.    -urecholine increased to 10mg  qid 11/22  Improved, decrease urecholine to 10mg  TID 11.  Hyperlipidemia.  Zocor 12. Thrush- continue Diflucan 200 mg x1 and 100 mg daily x 6 days and con't nystatin 13. Diarrhea- resolved, continue to monitor.  15. Dizziness with therapy: orthostatic vitals ordered, TEDs. Improved with epley maneuver.  16.  Decreased appetite: decrease feeding supplement to BID. Continue Jevity supplement and d/c upon discharge 17.  Hyponatremia  Sodium 133 on 11/16, 134 on 11/21, repeat weekly.  18. Sleep disturbance  Continue melatonin  Added magnesium gluconate 250mg  HS  Ordered therapeutic grounds pass to help reset circadian rhythm 19. Tachycardia: continue to monitor HR TID 20. Disposition: HFU scheduled  LOS: 17 days A FACE TO FACE EVALUATION WAS PERFORMED  12/16 P Abdo Denault 10/27/2021, 10:16 AM

## 2021-10-27 NOTE — Progress Notes (Signed)
Physical Therapy Session Note  Patient Details  Name: Raymond Stuart MRN: [id number] Date of Birth: [birth year]  Today's Date: 10/27/2021 PT Individual Time: [phone] and 1401-1440 PT Individual Time Calculation (min): 55 min and 39 min  Short Term Goals: Week 2:  PT Short Term Goal 1 (Week 2): pt will transfer sit<>stand with LRAD and min A PT Short Term Goal 1 - Progress (Week 2): Met PT Short Term Goal 2 (Week 2): pt will ambulate 41f with LRAD and mod A of 1 PT Short Term Goal 2 - Progress (Week 2): Met PT Short Term Goal 3 (Week 2): pt will perform simulated car transfer with LRAD and max A of 1 PT Short Term Goal 3 - Progress (Week 2): Met Week 3:  PT Short Term Goal 1 (Week 3): STG=LTG due to LOS  Skilled Therapeutic Interventions/Progress Updates:   Treatment Session 1 Received pt sidelying in bed, pt agreeable to PT treatment, and reported pain 3/10 along buttocks - repositioning done to reduce pain levels. Session with emphasis on functional mobility/transfers, dressing, generalized strengthening, dynamic standing balance/coordination, gait training, and improved activity tolerance. Pt transferred semi-reclined<>sitting EOB with HOB elevated and use of bedrails with supervision. Donned pull over shirt with set up assist and pants up to thighs with supervision and socks and shoes with max A for time management purposes. Sit<>stand from elevated EOB with RW and CGA to pull pants over hips. Pt ambulated 176fwith RW and CGA to WC. [facility]. Sit<>stand with RW and CGA x 2 trials and ambulated 9048f 2 with RW and CGA. Pt demonstrated improvements in ability to extend L knee in stance but continues to require multiple extended rest breaks in between activities. Worked on dynamic standing balance performing alternating toe taps to 3in step with BUE support on RW and CGA for balance for 2x10 reps to fatigue. Stand<>pivot mat<>WC with  RW and CGA. Concluded session with pt sitting in WC, needs within reach, and seatbelt alarm on. Provided pt with fresh drink.   Treatment Session 2 Received pt semi-reclined in bed with wife present at bedside. Pt agreeable to PT treatment and reported pain 7/10 along buttocks - repositioning done to reduce pain levels. Session with emphasis on functional mobility/transfers, generalized strengthening, dynamic standing balance/coordination, gait training, and improved activity tolerance. Pt transferred supine<>sitting EOB from flat bed with use of bedrails with supervision. Discussed with wife ordering bed assist rail for improved independence with bed mobility at home. Donned shoes with max A and pt transferred sit<>stand with RW and CGA and ambulated [id number] and [id number] with RW and CGA. Pt required extensive seated rest break after ambulating but continues to progress with gait distances each session. Pt transferred on/off Nustep with RW and CGA and performed BUE/LE strengthening on Nustep at workload 3 for 5 minutes for a total of 276 steps with emphasis on cardiovascular endurance - reported increased "numbness" in buttocks after activity. [facility] and agreed to stay sitting up for a little while before returning to bed. Concluded session with pt sitting in WC, needs within reach, and chair pad alarm on.   Therapy Documentation Precautions:  Precautions Precautions: Fall Precaution Comments: g-tube Restrictions Weight Bearing Restrictions: No  Therapy/Group: Individual Therapy Shellie Goettl Alfonse AlpersDPT   10/27/2021, 7:17 AM

## 2021-10-27 NOTE — Progress Notes (Signed)
Speech Language Pathology Daily Session Note  Patient Details  Name: Raymond Stuart MRN: [id number] Date of Birth: [birth year]  Today's Date: 10/27/2021 SLP Individual Time: 1120-1220 SLP Individual Time Calculation (min): 60 min  Short Term Goals: Week 3: SLP Short Term Goal 1 (Week 3): STG = LTG d/t ELOS  Skilled Therapeutic Interventions:   Patient seen for skilled ST session with focus on cognitive and swallow function goals. Patient reported that he ate approximately 60% of french toast on his breakfast tray this morning which SLP wrote down and left in room. SLP introduced activity of bible-themed crossword puzzle with SLP reading off clues and patient attempting to solve. He initially required moderate amount of verbal cues as patient continues with poor initiation and active participation. He does not appear to be resistant towards activities, but he does seem to lack adequate motivation. Patient did become a little more engaged in task and SLP left in room with plans to attempt another date.  SLP also observed patient with trial tray of regular texture solids. (He has been on Dys 3 solids after recent upgrade). He consumed approximately 85% of meat, 20% of mashed potatoes a couple bites of cooked carrots. He did not exhibit any significant oral transit or mastication delays and did not exhibit any overt s/s aspiration or penetration. SLP to continue with regular texture solids for patient and again encouraged him to order from ala carte menu due to his poor overall PO intake. He did fairly accurately determine approximate percentage of food eaten as "around 60%, maybe a little less because of the carrots". Patient continues to benefit from skilled SLP intervention to maximize cognitive, voice and swallow function goals prior to discharge.  Pain Pain Assessment Pain Scale: 0-10 Pain Score: 0-No pain  Therapy/Group: Individual Therapy  Angela Nevin, MA, CCC-SLP Speech Therapy

## 2021-10-28 DIAGNOSIS — D62 Acute posthemorrhagic anemia: Secondary | ICD-10-CM

## 2021-10-28 LAB — GLUCOSE, CAPILLARY
Glucose-Capillary: 95 mg/dL (ref 70–99)
Glucose-Capillary: 112 mg/dL — ABNORMAL HIGH (ref 70–99)
Glucose-Capillary: 125 mg/dL — ABNORMAL HIGH (ref 70–99)

## 2021-10-28 NOTE — Progress Notes (Signed)
Speech Language Pathology Daily Session Note  Patient Details  Name: Raymond Stuart MRN: [id number] Date of Birth: November 21, 1951  Today's Date: 10/28/2021 SLP Individual Time: 8177-1165 SLP Individual Time Calculation (min): 30 min  Short Term Goals: Week 3: SLP Short Term Goal 1 (Week 3): STG = LTG d/t ELOS  Skilled Therapeutic Interventions: Skilled SLP intervention focused on cognition. Pt reports he likes food he is receiving on meal trays a lot more and improved po intake since diet has been upgraded. He demonstrated awareness of current physical and cognitive  limitations and became tearful with SLP about rehab process. Reassured patient he is making progress in therapy and educated patient on extrenal aids that will be helpful once home for memory throughout the time his brain is recovering. He participated well in skilled SLP intervention and is motivated to continue rehab once home. Cont with therapy per plan of care.      Pain Pain Assessment Pain Scale: Faces Pain Score: 0-No pain Faces Pain Scale: No hurt  Therapy/Group: Individual Therapy  Carlean Jews Shamiah Kahler 10/28/2021, 10:24 AM

## 2021-10-28 NOTE — Progress Notes (Signed)
PROGRESS NOTE   Subjective/Complaints: Patient seen sitting up in bed this morning.  He has questions regarding residual occasional burning that started during I/O caths.  ROS: Denies CP, SOB, N/V/D  Objective:   No results found. No results for input(s): WBC, HGB, HCT, PLT in the last 72 hours.  No results for input(s): NA, K, CL, CO2, GLUCOSE, BUN, CREATININE, CALCIUM in the last 72 hours.    Intake/Output Summary (Last 24 hours) at 10/28/2021 1219 Last data filed at 10/28/2021 0900 Gross per 24 hour  Intake 588 ml  Output 575 ml  Net 13 ml      Pressure Injury 09/27/21 Coccyx Medial Stage 2 -  Partial thickness loss of dermis presenting as a shallow open injury with a red, pink wound bed without slough. stage 2, 1.5X.6 (Active)  09/27/21 1300  Location: Coccyx  Location Orientation: Medial  Staging: Stage 2 -  Partial thickness loss of dermis presenting as a shallow open injury with a red, pink wound bed without slough.  Wound Description (Comments): stage 2, 1.5X.6  Present on Admission: Yes     Pressure Injury Buttocks Left;Anterior;Medial Deep Tissue Pressure Injury - Purple or maroon localized area of discolored intact skin or blood-filled blister due to damage of underlying soft tissue from pressure and/or shear. 1.5X1 inner buttock beside s (Active)     Location: Buttocks  Location Orientation: Left;Anterior;Medial  Staging: Deep Tissue Pressure Injury - Purple or maroon localized area of discolored intact skin or blood-filled blister due to damage of underlying soft tissue from pressure and/or shear.  Wound Description (Comments): 1.5X1 inner buttock beside stage 2  Present on Admission: Yes    Physical Exam: Vital Signs Blood pressure 110/74, pulse 73, temperature 98 F (36.7 C), resp. rate 17, height 6' (1.829 m), weight 70 kg, SpO2 97 %. Constitutional: No distress . Vital signs reviewed. HENT:  Normocephalic.  Atraumatic. Eyes: EOMI. No discharge. Cardiovascular: No JVD.  RRR. Respiratory: Normal effort.  No stridor.  Bilateral clear to auscultation. GI: Non-distended.  BS +.  + PEG. Skin: Warm and dry.  Buttock not examined today. Psych: Normal mood.  Normal behavior. Musc: No edema in extremities.  No tenderness in extremities. Neuro: Alert Motor:  RUE 4+/5 deltoid- 5-/5 otherwise, unchanged LUE- deltoid 4/5; otherwise 4+/5, stable RLE- 5-/5 LLE- HF 1+-2-/5; KE 2/5, DF 4-/5   Assessment/Plan: 1. Functional deficits which require 3+ hours per day of interdisciplinary therapy in a comprehensive inpatient rehab setting. Physiatrist is providing close team supervision and 24 hour management of active medical problems listed below. Physiatrist and rehab team continue to assess barriers to discharge/monitor patient progress toward functional and medical goals  Care Tool:  Bathing    Body parts bathed by patient: Right arm, Left arm, Chest, Abdomen, Right upper leg, Left upper leg, Face, Front perineal area   Body parts bathed by helper: Buttocks, Right lower leg, Left lower leg     Bathing assist Assist Level: Minimal Assistance - Patient > 75% (sitting with lateral leans for washing buttocks)     Upper Body Dressing/Undressing Upper body dressing   What is the patient wearing?: Pull over shirt  Upper body assist Assist Level: Supervision/Verbal cueing    Lower Body Dressing/Undressing Lower body dressing      What is the patient wearing?: Pants, Incontinence brief     Lower body assist Assist for lower body dressing: Minimal Assistance - Patient > 75%     Toileting Toileting    Toileting assist Assist for toileting: Contact Guard/Touching assist     Transfers Chair/bed transfer  Transfers assist  Chair/bed transfer activity did not occur: Safety/medical concerns  Chair/bed transfer assist level: Contact Guard/Touching assist      Locomotion Ambulation   Ambulation assist   Ambulation activity did not occur: Safety/medical concerns  Assist level: Contact Guard/Touching assist Assistive device: Walker-rolling Max distance: 21ft   Walk 10 feet activity   Assist  Walk 10 feet activity did not occur: Safety/medical concerns  Assist level: Contact Guard/Touching assist Assistive device: Walker-rolling   Walk 50 feet activity   Assist Walk 50 feet with 2 turns activity did not occur: Safety/medical concerns  Assist level: Contact Guard/Touching assist Assistive device: Walker-rolling    Walk 150 feet activity   Assist Walk 150 feet activity did not occur: Safety/medical concerns         Walk 10 feet on uneven surface  activity   Assist Walk 10 feet on uneven surfaces activity did not occur: Safety/medical concerns   Assist level: Minimal Assistance - Patient > 75% Assistive device: Development worker, international aid     Assist Is the patient using a wheelchair?: Yes Type of Wheelchair: Manual Wheelchair activity did not occur: Safety/medical concerns  Wheelchair assist level: Supervision/Verbal cueing Max wheelchair distance: 80    Wheelchair 50 feet with 2 turns activity    Assist    Wheelchair 50 feet with 2 turns activity did not occur: Safety/medical concerns   Assist Level: Supervision/Verbal cueing   Wheelchair 150 feet activity     Assist  Wheelchair 150 feet activity did not occur: Safety/medical concerns   Assist Level: Supervision/Verbal cueing   Blood pressure 110/74, pulse 73, temperature 98 F (36.7 C), resp. rate 17, height 6' (1.829 m), weight 70 kg, SpO2 97 %.    Medical Problem List and Plan: 1.  Debility secondary to sepsis/multi medical with history of traumatic subdural hematoma, traumatic intraparenchymal hemorrhage/occipital fracture, aspiration pneumonia, ischemic CVA  Continue CIR 2.  Left proximal mid femoral vein and left proximal  profunda vein DVTs, isolated as per discussion with vascular. Given history of traumatic subdural hematoma and intraparenchymal hemorrhage will continue low dose aspirin and plan for repeat Doppler to assess propogation.  -Continue Lovenox             -antiplatelet therapy: Aspirin 81 mg daily 3. Pain: continue Voltaren gel, Lidoderm patch as directed, oxycodone as needed  Controlled with meds on 12/3 4. Mood: Provide emotional support             -antipsychotic agents: N/A 5. Neuropsych: This patient is?  Fully capable of making decisions on his own behalf. 6. Left buttock pressure injury: Routine skin checks, continue wound care.  7. Fluids/Electrolytes/Nutrition: Routine in and outs  8. Dysphagia/gastrostomy tube.   Gastrostomy tube placed 10/27 at [facility], will consider removal prior to d/c Started on regular thins on 12/2 Jevity changed to as needed 9.  AKI.  Resolved 10.  Acute urinary retention/Pseudomonas Klebsiella UTI.  d/ced foley. I&O ordered PRN.  Completed course of IV Zosyn.    Decrease urecholine to 10mg  TID  Improving 11.  Hyperlipidemia.  Zocor 12. Thrush- Diflucan 200 mg x1 and 100 mg daily x 6 days and nystatin DC'd 13. Diarrhea- resolved, continue to monitor.  15. Dizziness with therapy: orthostatic vitals ordered, TEDs. Improved with epley maneuver.  16. Decreased appetite: decrease feeding supplement to BID. Continue Jevity supplement and d/c upon discharge 17.  Hyponatremia  Sodium 134 on 11/21, labs ordered for Monday 18. Sleep disturbance  Continue melatonin  Added magnesium gluconate 250mg  HS  Ordered therapeutic grounds pass to help reset circadian rhythm  Improving 19. Tachycardia: continue to monitor HR TID  Improving 20.  Mild acute blood loss anemia  Hemoglobin 12.4 in 11/16, labs ordered for Monday  LOS: 18 days A FACE TO FACE EVALUATION WAS PERFORMED  Armend Hochstatter Monday 10/28/2021, 12:19 PM

## 2021-10-29 LAB — GLUCOSE, CAPILLARY: Glucose-Capillary: 83 mg/dL (ref 70–99)

## 2021-10-29 MED ORDER — HYDROCORTISONE ACETATE 25 MG RE SUPP
25.0000 mg | Freq: Two times a day (BID) | RECTAL | Status: DC | PRN
  Filled 2021-10-29: qty 1

## 2021-10-29 NOTE — Progress Notes (Signed)
Physical Therapy Session Note  Patient Details  Name: Raymond Stuart MRN: [id number] Date of Birth: 20-Feb-1951  Today's Date: 10/29/2021 PT Individual Time: 0800-0853 PT Individual Time Calculation (min): 53 min   Short Term Goals: Week 2:  PT Short Term Goal 1 (Week 2): pt will transfer sit<>stand with LRAD and min A PT Short Term Goal 1 - Progress (Week 2): Met PT Short Term Goal 2 (Week 2): pt will ambulate 48f with LRAD and mod A of 1 PT Short Term Goal 2 - Progress (Week 2): Met PT Short Term Goal 3 (Week 2): pt will perform simulated car transfer with LRAD and max A of 1 PT Short Term Goal 3 - Progress (Week 2): Met Week 3:  PT Short Term Goal 1 (Week 3): STG=LTG due to LOS  Skilled Therapeutic Interventions/Progress Updates:   Received pt semi-reclined in bed with RN present administering medications. Pt agreeable to PT treatment and stated pain in buttocks was "not bad" this morning but reported not sleeping well last night. Session with emphasis on functional mobility, dressing, dynamic standing balance, gait training, stair navigation, and improved endurance. Donned R sock with set up assist but required total A for L sock due to knee pain. Pt transferred semi-reclined<>sitting EOB with HOB elevated and use of bedrails with supervision. Donned pants sitting EOB up to thighs with supervision, shoes with max A, and stood and pulled pants over hips with close supervision. Stand<>pivot bed<>WC with RW and close supervision. [facility]. Sit<>stand with RW and close supervision and ambulated 1144fx 1 and 6049f 1 with RW and CGA/close supervision with seated rest break in between. Pt with increased L knee flexion in stance with gait due to increased knee pain this morning. Pt then ambulated [id number] trials with RW and CGA/supervision to/from staircase. Pt ambulated 4 steps with 2 rails and min A ascending and descending with a step to  pattern - pt able to recall technique for "up with the good, down with the bad" without cues. Worked on blocked practice sit<>stands from elevated EOM with emphasis on not using RW to challenge balance and load quads. Pt able to perform x 6 reps with CGA but relied on gower's maneuver with hands placed on thighs to assist when returning to standing. Stand<>pivot mat<>WC with RW and close supervision and requested to return to bed. Stand<>pivot WC<>bed with RW and close supervision and sit<>supine with supervision. Concluded session with pt semi-reclined in bed, needs within reach, and bed alarm on.   Therapy Documentation Precautions:  Precautions Precautions: Fall Precaution Comments: g-tube Restrictions Weight Bearing Restrictions: No  Therapy/Group: Individual Therapy AnnaAlfonse Alpers DPT   10/29/2021, 7:12 AM

## 2021-10-29 NOTE — Progress Notes (Signed)
Occupational Therapy Session Note  Patient Details  Name: Raymond Stuart MRN: [id number] Date of Birth: 12-Sep-1951  Today's Date: 10/29/2021 OT Individual Time: 1100-1215 OT Individual Time Calculation (min): 75 min    Short Term Goals: Week 3:  OT Short Term Goal 1 (Week 3): STG = LTG due to ELOS  Skilled Therapeutic Interventions/Progress Updates:    Pt resting in bed upon arrival. Pt requested to take a shower this morning. Pt amb with RW to bathroom with CGA and completed TTB transfer with supervision using grab bars. Bathing with supervision seated with lateral leans for buttocks. Pt returned to EOB to complete dressing tasks. Sit<>stand from EOB with CGA for LB clothing mgmt. Pt required more then a reasonable amount of time to complete tasks with extended rest breaks between segments. OT intervention with focus on activity tolerance/endurance, standing balance, functional amb with RW, and safety awareness to increase independence with BADLs.  Therapy Documentation Precautions:  Precautions Precautions: Fall Precaution Comments: g-tube Restrictions Weight Bearing Restrictions: No   Pain: Pt denies pain this morning but states he is experiencing some slight discomfort in Lt knee after PT; warm shower and repositioned   Therapy/Group: Individual Therapy  Rich Brave 10/29/2021, 12:23 PM

## 2021-10-30 LAB — CBC WITH DIFFERENTIAL/PLATELET
Abs Immature Granulocytes: 0.02 10*3/uL (ref 0.00–0.07)
Basophils Absolute: 0 10*3/uL (ref 0.0–0.1)
Basophils Relative: 1 %
Eosinophils Absolute: 0.1 10*3/uL (ref 0.0–0.5)
Eosinophils Relative: 2 %
HCT: 43.5 % (ref 39.0–52.0)
Hemoglobin: 14.2 g/dL (ref 13.0–17.0)
Immature Granulocytes: 0 %
Lymphocytes Relative: 35 %
Lymphs Abs: 2.8 10*3/uL (ref 0.7–4.0)
MCH: 29.1 pg (ref 26.0–34.0)
MCHC: 32.6 g/dL (ref 30.0–36.0)
MCV: 89.1 fL (ref 80.0–100.0)
Monocytes Absolute: 0.8 10*3/uL (ref 0.1–1.0)
Monocytes Relative: 10 %
Neutro Abs: 4.2 10*3/uL (ref 1.7–7.7)
Neutrophils Relative %: 52 %
Platelets: 250 10*3/uL (ref 150–400)
RBC: 4.88 MIL/uL (ref 4.22–5.81)
RDW: 14.6 % (ref 11.5–15.5)
WBC: 8 10*3/uL (ref 4.0–10.5)
nRBC: 0 % (ref 0.0–0.2)

## 2021-10-30 LAB — BASIC METABOLIC PANEL
Anion gap: 9 (ref 5–15)
BUN: 16 mg/dL (ref 8–23)
CO2: 20 mmol/L — ABNORMAL LOW (ref 22–32)
Calcium: 9 mg/dL (ref 8.9–10.3)
Chloride: 106 mmol/L (ref 98–111)
Creatinine, Ser: 0.94 mg/dL (ref 0.61–1.24)
GFR, Estimated: >60 mL/min (ref >60)
Glucose, Bld: 92 mg/dL (ref 70–99)
Potassium: 3.8 mmol/L (ref 3.5–5.1)
Sodium: 135 mmol/L (ref 135–145)

## 2021-10-30 MED ORDER — PANTOPRAZOLE 2 MG/ML SUSPENSION
40.0000 mg | Freq: Every day | ORAL | Status: DC
  Administered 2021-10-31: 40 mg via ORAL
  Filled 2021-10-30: qty 20

## 2021-10-30 NOTE — Progress Notes (Signed)
Occupational Therapy Session Note  Patient Details  Name: Raymond Stuart MRN: [id number] Date of Birth: [birth year]  Today's Date: 10/30/2021 OT Individual Time: 0076-2263 OT Individual Time Calculation (min): 31 min    Short Term Goals: Week 3:  OT Short Term Goal 1 (Week 3): STG = LTG due to ELOS  Skilled Therapeutic Interventions/Progress Updates:  Skilled OT intervention completed with focus on family education with pt's wife regarding shower/toileting transfers, home management and DME, as well as donning/doffing knee brace for increased independence in self-care dressing tasks. Pt received upright in bed, agreeable to session. Reported knee brace was delivered, with questions about how to get it on/off. Bed mobility completed with supervision assist to sit EOB. Doffed brace with total A. Education provided on strap management, orientation placement of brace, with pt return demonstrating the ability to donn with supervision/cues. Discussed with wife pt's CLOF with functional transfers, and pt's showers at home available with wife reporting they have a walk in shower available with small lip to enter with shower curtain only. Discussed benefits of using TTB like method used during therapy, to prevent pt from standing in shower as well as to decrease level of assist needed by wife. Education provided on technique, as well as safety considerations prior/following shower. Educated on AE used in therapy, with pt set up with all needed items at home already from previous surgery. Pt left seated EOB, planning to call for assist to toilet when he felt the urge, with wife in room/planning to stay, bed alarm on and all needs in reach at end of session. Pt missed 14 mins of OT intervention due to therapist assisting with toileting in previous session  Therapy Documentation Precautions:  Precautions Precautions: Fall Precaution Comments: peg tube Restrictions Weight Bearing Restrictions:  No  Pain: Unrated pain in L knee with pain patch already on, pt request to doff and apply pain cream instead.    Therapy/Group: Individual Therapy  [name] [name] 10/30/2021, 7:32 AM

## 2021-10-30 NOTE — Progress Notes (Signed)
Patient ID: Raymond Stuart, male   DOB: [birth year], 70 y.o.   MRN: [id number]   Wheelchair, bedside commode and transfer bench ordered through [employer]  Lavera Guise, [place] [phone]

## 2021-10-30 NOTE — Progress Notes (Signed)
Orthopedic Tech Progress Note Patient Details:  Raymond Stuart [birth year] [id number]  Ortho Devices Type of Ortho Device: Knee Sleeve Ortho Device/Splint Location: LLE Ortho Device/Splint Interventions: Ordered, Application, Adjustment   Post Interventions Patient Tolerated: Well Instructions Provided: Care of device  [name] 10/30/2021, 11:36 AM

## 2021-10-30 NOTE — Progress Notes (Signed)
Physical Therapy Discharge Summary  Patient Details  Name: Raymond Stuart MRN: [id number] Date of Birth: Oct 21, 1951    Patient has met 8 of 8 long term goals due to improved activity tolerance, improved balance, improved postural control, increased strength, increased range of motion, decreased pain, improved awareness, and improved coordination. Patient to discharge at a wheelchair level Modified Independent and at an ambulatory level supervision. Patient's care partner is independent to provide the necessary physical assistance at discharge.  Reasons goals not met: na  Recommendation:  Patient will benefit from ongoing skilled PT services in home health setting to continue to advance safe functional mobility, address ongoing impairments in generalized strengthening, dynamic standing balance/coordination, gait training, endurance, and to minimize fall risk.  Equipment: 16x16 manual WC, already has RW  Reasons for discharge: treatment goals met  Patient/family agrees with progress made and goals achieved: Yes  PT Discharge Precautions/Restrictions Precautions Precautions: Fall Precaution Comments: peg tube Restrictions Weight Bearing Restrictions: No Pain Interference Pain Interference Pain Effect on Sleep: 2. Occasionally Pain Interference with Therapy Activities: 1. Rarely or not at all Pain Interference with Day-to-Day Activities: 1. Rarely or not at all Cognition Overall Cognitive Status: Within Functional Limits for tasks assessed Arousal/Alertness: Awake/alert Orientation Level: Oriented X4 Memory: Appears intact Awareness: Appears intact Problem Solving: Appears intact Safety/Judgment: Appears intact Sensation Sensation Light Touch: Impaired by gross assessment Proprioception: Appears Intact Additional Comments: decreased sensation L>R along lateral knee and gastroc Coordination Gross Motor Movements are Fluid and Coordinated: Yes Fine Motor Movements are Fluid  and Coordinated: No Coordination and Movement Description: generalized weakness/deconditoning and decreased balance/postural control Finger Nose Finger Test: decreased precision LUE>RUE Heel Shin Test: WFL bilaterally but slower Motor  Motor Motor: Within Functional Limits Motor - Skilled Clinical Observations: generalized weakness/deconditoning and decreased balance/postural control  Mobility Bed Mobility Bed Mobility: Rolling Right;Rolling Left;Sit to Supine;Supine to Sit Rolling Right: Independent with assistive device Rolling Left: Independent with assistive device Supine to Sit: Independent with assistive device Sit to Supine: Independent with assistive device Transfers Transfers: Sit to Stand;Stand Pivot Transfers;Stand to Sit Sit to Stand: Independent with assistive device Stand to Sit: Independent with assistive device Stand Pivot Transfers: Independent with assistive device Transfer (Assistive device): Rolling walker Locomotion  Gait Ambulation: Yes Gait Assistance: Independent with assistive device Gait Distance (Feet): 150 Feet Assistive device: Rolling walker Gait Gait: Yes Gait Pattern: Impaired (downward gaze, maintains knee flexion L>R initial contact thru midstance, forward trunk lean) Gait velocity: decreased Stairs / Additional Locomotion Stairs: Yes Stairs Assistance: Contact Guard/Touching assist Stair Management Technique: Two rails Number of Stairs: 4 Height of Stairs: 6 Ramp: Supervision/Verbal cueing Curb: Contact Guard/Touching assist Pick up small object from the floor assist level: Supervision/Verbal cueing Pick up small object from the floor assistive device: walker, Art gallery manager Mobility: Yes Wheelchair Assistance: Independent with Camera operator: Both upper extremities Wheelchair Parts Management: Independent Distance: 150  Trunk/Postural Assessment  Cervical Assessment Cervical  Assessment: Exceptions to [facility] (forward head) Thoracic Assessment Thoracic Assessment: Exceptions to [facility] (kyphosis) Lumbar Assessment Lumbar Assessment: Exceptions to [facility] (posterior pelvic tilt) Postural Control Postural Control: Within Functional Limits  Balance Balance Balance Assessed: Yes Standardized Balance Assessment Standardized Balance Assessment: Berg Balance Test Berg Balance Test Sit to Stand: Able to stand  independently using hands Standing Unsupported: Able to stand safely 2 minutes Sitting with Back Unsupported but Feet Supported on Floor or Stool: Able to sit safely and securely 2 minutes Stand to Sit: Controls descent by using  hands Transfers: Able to transfer safely, minor use of hands Standing Unsupported with Eyes Closed: Unable to keep eyes closed 3 seconds but stays steady Standing Ubsupported with Feet Together: Able to place feet together independently but unable to hold for 30 seconds From Standing, Reach Forward with Outstretched Arm: Can reach confidently >25 cm (10") From Standing Position, Pick up Object from Floor: Unable to try/needs assist to keep balance From Standing Position, Turn to Look Behind Over each Shoulder: Looks behind one side only/other side shows less weight shift Turn 360 Degrees: Able to turn 360 degrees safely but slowly Standing Unsupported, Alternately Place Feet on Step/Stool: Able to complete >2 steps/needs minimal assist Standing Unsupported, One Foot in Front: Able to plae foot ahead of the other independently and hold 30 seconds Standing on One Leg: Tries to lift leg/unable to hold 3 seconds but remains standing independently Total Score: 35 Static Sitting Balance Static Sitting - Balance Support: Feet supported;Bilateral upper extremity supported Static Sitting - Level of Assistance: 7: Independent Dynamic Sitting Balance Dynamic Sitting - Balance Support: Feet supported;No upper extremity supported Dynamic Sitting - Level of  Assistance: 6: Modified independent (Device/Increase time) Static Standing Balance Static Standing - Balance Support: Bilateral upper extremity supported Static Standing - Level of Assistance: 6: Modified independent (Device/Increase time) Dynamic Standing Balance Dynamic Standing - Balance Support: Bilateral upper extremity supported Dynamic Standing - Level of Assistance: 6: Modified independent (Device/Increase time) Extremity Assessment  RLE Assessment RLE Assessment: Exceptions to [facility] General Strength Comments: grossly generalized to 4/5 (except hip adduction 3+/5) LLE Assessment LLE Assessment: Exceptions to [facility] General Strength Comments: grossly generalized to 4-/5 (except hip adduction 3+/5)  Callie Fielding, PT Estevan Ryder, PT, DPT, CBIS   Alfonse Alpers PT, DPT  10/30/2021, 7:25 AM

## 2021-10-30 NOTE — Progress Notes (Signed)
Occupational Therapy Session Note  Patient Details  Name: Raymond Stuart MRN: [id number] Date of Birth: 01-08-1951  Today's Date: 10/30/2021 OT Individual Time: 1440-1515 OT Individual Time Calculation (min): 35 min    Short Term Goals: Week 3:  OT Short Term Goal 1 (Week 3): STG = LTG due to ELOS  Skilled Therapeutic Interventions/Progress Updates:  Skilled OT intervention completed with focus on functional transfers. Pt received supine in bed using urinal with wife in room. Pt agreeable to extra therapy, with pt requesting assist to [facility]. Bed mobility with supervision to seated EOB, donned shoes with supervision, sit > stand using RW with CGA then ambulatory transfer to [facility] in bathroom with CGA. Pt attempting to have BM however reporting unable. Attempted posterior pericare to ensure cleanliness in stance using RW with CGA. Educated wife on transfer assist level and pts CLOF with Immunologist. Stand pivot to TTB using RW and CGA, then pt able to complete shower transfer with supervision, with wife present for education on technique and assist level vs having to manage RW over shower lip. Pt attempting to stand with mod A from TTB using both hands on knees without RW with pt reporting "not having anything to push up from." Educated on safe sit > stand and technique of easing the transition from lower surface. Completed mass practice sit > stands while seated EOB, without pushing up from bed to simulate standing from lower surface. Pt progressing his ability initially at mod A using RW, to CGA using one hand on RW on 3rd trial, with cues provided on hand placement and forward lean/BLE weightbearing. Bed mobility completed with supervision. Pt left semi-supine, with wife in room, bed alarm on and all needs in reach at end of session.   Therapy Documentation Precautions:  Precautions Precautions: Fall Precaution Comments: peg tube Restrictions Weight Bearing Restrictions: No  Pain: No c/o  pain   Therapy/Group: Individual Therapy  La Shehan E Shondra Capps 10/30/2021, 3:58 PM

## 2021-10-30 NOTE — Progress Notes (Signed)
Physical Therapy Session Note  Patient Details  Name: Raymond Stuart MRN: [id number] Date of Birth: 1950/12/24  Today's Date: 10/30/2021 PT Individual Time: 0931-1216 PT Individual Time Calculation (min): 55 min   Short Term Goals: Week 2:  PT Short Term Goal 1 (Week 2): pt will transfer sit<>stand with LRAD and min A PT Short Term Goal 1 - Progress (Week 2): Met PT Short Term Goal 2 (Week 2): pt will ambulate 80f with LRAD and mod A of 1 PT Short Term Goal 2 - Progress (Week 2): Met PT Short Term Goal 3 (Week 2): pt will perform simulated car transfer with LRAD and max A of 1 PT Short Term Goal 3 - Progress (Week 2): Met Week 3:  PT Short Term Goal 1 (Week 3): STG=LTG due to LOS  Skilled Therapeutic Interventions/Progress Updates:   Received pt sitting in WC, pt agreeable to PT treatment, and reported pain 4/10 in buttocks - repositioning done to reduce pain levels. Session with emphasis on D?C planning, functional mobility/transfers, generalized strengthening, dynamic standing balance/coordination, gait training, and improved activity tolerance. [facility] for [facility] purposes. Ace wrapped pt's L knee for stability and pt ambulated 980fx 2 trials with RW and CGA/close supervision. Pt continues to demonstrate L knee flexion in stance with difficulty extending. Pt reported that the ace wrap was too tight and making the swelling worse - therefore removed. Pt with questions regarding a knee sleeve - notified MD to place order. Sit<>stand with RW and close supervision and pt performed the following exercises standing with BUE support and CGA for balance: -marches with 1lb ankle weight x10 bilaterally  -heel raises with 1lb ankle weight 2x10 -mini squats 1x10 Pt requested to return to bed and transferred WC<>bed stand<>pivot with RW and supervision. Doffed shoes and transferred sit<>supine with supervision. Concluded session with pt supine in bed, needs within  reach, and bed alarm on.   Therapy Documentation Precautions:  Precautions Precautions: Fall Precaution Comments: g-tube Restrictions Weight Bearing Restrictions: No  Therapy/Group: Individual Therapy AnAlfonse AlpersT, DPT   10/30/2021, 7:18 AM

## 2021-10-30 NOTE — Progress Notes (Signed)
PROGRESS NOTE   Subjective/Complaints: No new complaints this morning Switched protonix to Po Eating better so not requiring PEG- PEG was placed 10/27  ROS: Denies CP, SOB, N/V/D  Objective:   No results found. No results for input(s): WBC, HGB, HCT, PLT in the last 72 hours.  No results for input(s): NA, K, CL, CO2, GLUCOSE, BUN, CREATININE, CALCIUM in the last 72 hours.    Intake/Output Summary (Last 24 hours) at 10/30/2021 0933 Last data filed at 10/30/2021 5188 Gross per 24 hour  Intake 420 ml  Output 800 ml  Net -380 ml     Pressure Injury 09/27/21 Coccyx Medial Stage 2 -  Partial thickness loss of dermis presenting as a shallow open injury with a red, pink wound bed without slough. stage 2, 1.5X.6 (Active)  09/27/21 1300  Location: Coccyx  Location Orientation: Medial  Staging: Stage 2 -  Partial thickness loss of dermis presenting as a shallow open injury with a red, pink wound bed without slough.  Wound Description (Comments): stage 2, 1.5X.6  Present on Admission: Yes    Physical Exam: Vital Signs Blood pressure 106/66, pulse 74, temperature 98.2 F (36.8 C), temperature source Oral, resp. rate 18, height 6' (1.829 m), weight 80.9 kg, SpO2 95 %. Constitutional: No distress . Vital signs reviewed. HENT: Normocephalic.  Atraumatic. Eyes: EOMI. No discharge. Cardiovascular: No JVD.  RRR. Respiratory: Normal effort.  No stridor.  Bilateral clear to auscultation. GI: Non-distended.  BS +.  + PEG. Skin: Warm and dry.  Stage 2 to coccyx Normal mood.  Normal behavior. Musc: No edema in extremities.  No tenderness in extremities. Neuro: Alert Motor:  RUE 4+/5 deltoid- 5-/5 otherwise, unchanged LUE- deltoid 4/5; otherwise 4+/5, stable RLE- 5-/5 LLE- HF 1+-2-/5; KE 2/5, DF 4-/5   Assessment/Plan: 1. Functional deficits which require 3+ hours per day of interdisciplinary therapy in a comprehensive inpatient  rehab setting. Physiatrist is providing close team supervision and 24 hour management of active medical problems listed below. Physiatrist and rehab team continue to assess barriers to discharge/monitor patient progress toward functional and medical goals  Care Tool:  Bathing    Body parts bathed by patient: Right arm, Left arm, Chest, Abdomen, Right upper leg, Left upper leg, Face, Front perineal area, Buttocks, Right lower leg, Left lower leg   Body parts bathed by helper: Buttocks, Right lower leg, Left lower leg     Bathing assist Assist Level: Supervision/Verbal cueing     Upper Body Dressing/Undressing Upper body dressing   What is the patient wearing?: Pull over shirt    Upper body assist Assist Level: Set up assist    Lower Body Dressing/Undressing Lower body dressing      What is the patient wearing?: Pants, Incontinence brief     Lower body assist Assist for lower body dressing: Contact Guard/Touching assist     Toileting Toileting    Toileting assist Assist for toileting: Contact Guard/Touching assist     Transfers Chair/bed transfer  Transfers assist  Chair/bed transfer activity did not occur: Safety/medical concerns  Chair/bed transfer assist level: Supervision/Verbal cueing     Locomotion Ambulation   Ambulation assist   Ambulation activity  did not occur: Safety/medical concerns  Assist level: Contact Guard/Touching assist Assistive device: Walker-rolling Max distance: 172ft   Walk 10 feet activity   Assist  Walk 10 feet activity did not occur: Safety/medical concerns  Assist level: Contact Guard/Touching assist Assistive device: Walker-rolling   Walk 50 feet activity   Assist Walk 50 feet with 2 turns activity did not occur: Safety/medical concerns  Assist level: Contact Guard/Touching assist Assistive device: Walker-rolling    Walk 150 feet activity   Assist Walk 150 feet activity did not occur: Safety/medical  concerns         Walk 10 feet on uneven surface  activity   Assist Walk 10 feet on uneven surfaces activity did not occur: Safety/medical concerns   Assist level: Minimal Assistance - Patient > 75% Assistive device: Walker-rolling   Wheelchair     Assist Is the patient using a wheelchair?: Yes Type of Wheelchair: Manual Wheelchair activity did not occur: Safety/medical concerns  Wheelchair assist level: Supervision/Verbal cueing Max wheelchair distance: 80    Wheelchair 50 feet with 2 turns activity    Assist    Wheelchair 50 feet with 2 turns activity did not occur: Safety/medical concerns   Assist Level: Supervision/Verbal cueing   Wheelchair 150 feet activity     Assist  Wheelchair 150 feet activity did not occur: Safety/medical concerns   Assist Level: Supervision/Verbal cueing   Blood pressure 106/66, pulse 74, temperature 98.2 F (36.8 C), temperature source Oral, resp. rate 18, height 6' (1.829 m), weight 80.9 kg, SpO2 95 %.    Medical Problem List and Plan: 1.  Debility secondary to sepsis/multi medical with history of traumatic subdural hematoma, traumatic intraparenchymal hemorrhage/occipital fracture, aspiration pneumonia, ischemic CVA  Continue CIR 2.  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 Given history of traumatic subdural hematoma and intraparenchymal hemorrhage will continue low dose aspirin and plan for repeat Doppler to assess propogation.  -Continue Lovenox             -antiplatelet therapy: Aspirin 81 mg daily 3. Pain: continue Voltaren gel, Lidoderm patch as directed, oxycodone as needed  Controlled with meds on 12/3 4. Mood: Provide emotional support             -antipsychotic agents: N/A 5. Neuropsych: This patient is?  Fully capable of making decisions on his own behalf. 6. Left buttock pressure injury: Routine skin checks, continue wound care.  7.  Fluids/Electrolytes/Nutrition: Routine in and outs  8. Dysphagia/gastrostomy tube.   Gastrostomy tube placed 10/27 at [facility], will consider removal prior to d/c Started on regular thins on 12/2 Jevity changed to as needed 9.  AKI.  Resolved 10.  Acute urinary retention/Pseudomonas Klebsiella UTI.  d/ced foley. I&O ordered PRN.  Completed course of IV Zosyn.    Decrease urecholine to 10mg  TID  Improving 11.  Hyperlipidemia.  Zocor 12. Thrush- Diflucan 200 mg x1 and 100 mg daily x 6 days and nystatin DC'd 13. Diarrhea- resolved, continue to monitor.  15. Dizziness with therapy: orthostatic vitals ordered, TEDs. Improved with epley maneuver.  16. Decreased appetite: decrease feeding supplement to BID. Continue Jevity supplement and d/c upon discharge 17.  Hyponatremia  Sodium 134 on 11/21, labs ordered for Monday 18. Sleep disturbance  Continue melatonin  Added magnesium gluconate 250mg  HS  Ordered therapeutic grounds pass to help reset circadian rhythm  Improving 19. Tachycardia: continue to monitor HR TID  Improving 20.  Mild acute blood loss anemia  Hemoglobin 12.4 in 11/16, labs ordered for Monday 21. GERD: transitioned protonix to po  LOS: 20 days A FACE TO FACE EVALUATION WAS PERFORMED  Drema Pry Tamicka Shimon 10/30/2021, 9:33 AM

## 2021-10-30 NOTE — Progress Notes (Signed)
Occupational Therapy Session Note  Patient Details  Name: Raymond Stuart MRN: [id number] Date of Birth: 08-21-1951  Today's Date: 10/30/2021 OT Individual Time: 4132-4401 OT Individual Time Calculation (min): 54 min    Short Term Goals: Week 3:  OT Short Term Goal 1 (Week 3): STG = LTG due to ELOS  Skilled Therapeutic Interventions/Progress Updates:  Skilled OT intervention completed with focus on integrating AE into ADLs, w/c leg rest assembly, and dynamic balance for increased activity tolerance. Pt received seated EOB, brushing teeth, agreeable to session. Pt reporting that he dressed his LB while supine in bed, including R sock however difficulty with L sock due to pain in L knee/pain patch restricting his knee bending. Education with demonstration provided on using sock aid to donn L sock, with pt able to complete with supervision, and cues for technique. Completed donning/tying of both shoes using figure 4 position of BLEs, with supervision assist. Sit > stand using RW with CGA, then stand pivot to w/c with CGA. Self-propelled in w/c for BUE endurance, with supervision to [facility] gym. Participated in 2 rounds of standing dynamic balance using RW with CGA-supervision assist for simulated dressing activity using clothespins, with pt able to locate about 20 clothes pins, alternating releasing one hand from RW for dynamic balance. Pt with L knee flexion throughout, with cues needed to put weight on L foot, as well as postural corrections. Seated rest break required in between trials, with pt tolerating about 3 mins in standing each trial. Education provided about w/c management for leg rests, to put on and take off for increased independence with w/c mobility and set up once home. Transported pt with total A in w/c to pt's room due to time, with pt left seated in w/c, with MD in room for rounds and NT in room changing linens, with all needs in reach at end of session.  Therapy Documentation Precautions:   Precautions Precautions: Fall Precaution Comments: peg tube Restrictions Weight Bearing Restrictions: No  Pain: Unrated pain in L knee, helped loosen the pain patch- due to per pt it was restricting his knee movement   Therapy/Group: Individual Therapy  [name] E Genae Strine 10/30/2021, 7:31 AM

## 2021-10-30 NOTE — Progress Notes (Signed)
Speech Language Pathology Daily Session Note  Patient Details  Name: Raymond Stuart MRN: [id number] Date of Birth: 1951/06/05  Today's Date: 10/30/2021 SLP Individual Time: 1517-1600 SLP Individual Time Calculation (min): 43 min  Short Term Goals: Week 3: SLP Short Term Goal 1 (Week 3): STG = LTG d/t ELOS  Skilled Therapeutic Interventions: Pt seen for skilled ST with focus on cognitive goals, wife present throughout. Focus of therapy on discharge planning and discussions around home management and safety with daily routine. Pt expressing some anxiety but good awareness about limitations with mobility at home, stating "I'm afraid I'm going to go home and think I can get up and go in the yard like I used to but I know I can't". Discussed fall reduction strategies (shoes in the house, removing throw rugs, being aware of movements through different flooring surfaces) with patient and wife verbalizing understanding. Pt demonstrates adequate awareness of recommended adaptive equipment and changes made to house to increase independence and safety. Pt demonstrating overall Supervision A for potential hazard recognition and complex problem solving, he and wife will benefit from ongoing education and training before discharge. Pt left in bed with alarm set and wife present for needs. Cont ST POC.   Pain Pain Assessment Pain Scale: 0-10 Pain Score: 0-No pain  Therapy/Group: Individual Therapy  Tacey Ruiz 10/30/2021, 4:03 PM

## 2021-10-31 LAB — CREATININE, SERUM
Creatinine, Ser: 0.84 mg/dL (ref 0.61–1.24)
GFR, Estimated: >60 mL/min (ref >60)

## 2021-10-31 MED ORDER — OXYCODONE HCL 5 MG PO TABS
5.0000 mg | ORAL_TABLET | Freq: Four times a day (QID) | ORAL | Status: DC | PRN
  Administered 2021-10-31 – 2021-11-01 (×5): 5 mg
  Filled 2021-10-31 (×5): qty 1

## 2021-10-31 MED ORDER — PANTOPRAZOLE SODIUM 40 MG PO TBEC
40.0000 mg | DELAYED_RELEASE_TABLET | Freq: Every day | ORAL | Status: DC
  Administered 2021-11-01 – 2021-11-04 (×4): 40 mg via ORAL
  Filled 2021-10-31 (×4): qty 1

## 2021-10-31 NOTE — Progress Notes (Signed)
Nutrition Follow-up  DOCUMENTATION CODES:   Severe malnutrition in context of acute illness/injury  INTERVENTION:  Continue Ensure Enlive po TID, each supplement provides 350 kcal and 20 grams of protein.   Continue Juven BID.    NUTRITION DIAGNOSIS:   Severe Malnutrition related to acute illness (SDH) as evidenced by percent weight loss, moderate fat depletion, moderate muscle depletion, severe muscle depletion; ongoing  GOAL:   Patient will meet greater than or equal to 90% of their needs; met  MONITOR:   PO intake, Supplement acceptance, Weight trends, Labs, I & O's, Skin  REASON FOR ASSESSMENT:   Consult Enteral/tube feeding initiation and management  ASSESSMENT:   70 year old right-handed male with history of GERD, hyperlipidemia, urolithiasis who was recently admitted to [facility] 9/26-9/29 and then at [facility] 9/30-11/1 after mechanical fall sustaining subdural hematoma traumatic intracranial hemorrhage ventricular hemorrhage in the setting of acute head injury with occipital fracture complicated by ischemic stroke follow-up aspiration pneumonia in the setting of stroke as well as PEG tube placement 84/16 complicated by AKI with urinary retention indwelling Foley catheter tube. Presented 09/27/2021 to [facility] long hospital from skilled nursing facility after being found  covered in vomitus as well as fever urine culture greater 100,000 Klebsiella as well as Pseudomonas with given completed course of abx. Therapy evaluations completed due to patient decreased functional mobility was admitted to CIR.  Pt is currently on a regular diet with thin liquids. Meal completion has been 75-100%. Pt currently has Ensure ordered and has been consuming them. Pt tolerating his PO diet. Plans to remove PEG tomorrow. RD to discontinue tube feeding orders. Pt encouraged to eat his food at meals and to drink his supplements.   Labs and medications reviewed.   Diet Order:   Diet Order              Diet NPO time specified  Diet effective midnight           Diet regular Room service appropriate? Yes; Fluid consistency: Thin  Diet effective 1000                   EDUCATION NEEDS:   Not appropriate for education at this time  Skin:  Skin Assessment: Reviewed RN Assessment Skin Integrity Issues:: Stage II DTI: buttocks Stage II: coccyx  Last BM:  12/3  Height:   Ht Readings from Last 1 Encounters:  10/10/21 6' (1.829 m)    Weight:   Wt Readings from Last 1 Encounters:  10/31/21 80.9 kg    BMI:  Body mass index is 24.19 kg/m.  Estimated Nutritional Needs:   Kcal:  2200-2400  Protein:  115-125 grams  Fluid:  >/= 2 L/day  Corrin Parker, MS, RD, LDN RD pager number/after hours weekend pager number on Amion.

## 2021-10-31 NOTE — Progress Notes (Signed)
Occupational Therapy Session Note  Patient Details  Name: Raymond Stuart MRN: [id number] Date of Birth: 03/26/1951  Today's Date: 10/31/2021 OT Individual Time: 1303-1348 OT Individual Time Calculation (min): 45 min   Short Term Goals: Week 3:  OT Short Term Goal 1 (Week 3): STG = LTG due to ELOS  Skilled Therapeutic Interventions/Progress Updates:    Pt greeted semi-reclined in bed and agreeable to OT treatment session. Nursing assisted pt with donning L knee brace. Pt completed bed mobility with supervision, then needed min A to don shoes. Sit<>stand from EOB with RW and mod A to power up, then min A to pivot w/ RW to wc. Pt propelled wc to elevators with supervision. UB strength/endurance with [employer] arm bike on level 4. 5 minutes forward, then 5 minutes backwards with extended rest break in between sets. Pt returned to room and left completed squat-pivot back to bed with min A. Pt dofffed shoes mod I and transferred back to bed with supervision. Pt left semi-reclined in bed with bed alarm on, call bell in reach, and needs met.   Therapy Documentation Precautions:  Precautions Precautions: Fall Precaution Comments: peg tube Restrictions Weight Bearing Restrictions: No Pain: Pain Assessment Pain Scale: 0-10 Pain Score: 0-No pain   Therapy/Group: Individual Therapy   S  10/31/2021, 1:36 PM 

## 2021-10-31 NOTE — Progress Notes (Signed)
Physical Therapy Session Note  Patient Details  Name: Raymond Stuart MRN: [id number] Date of Birth: 1951-03-27  Today's Date: 10/31/2021 PT Individual Time: 1400-1500 PT Individual Time Calculation (min): 60 min   Short Term Goals: Week 3:  PT Short Term Goal 1 (Week 3): STG=LTG due to LOS  Skilled Therapeutic Interventions/Progress Updates:    Pt received seated in bed, agreeable to PT session. Pt reports some pain in LLE at rest, premedicated prior to start of therapy session and declines further intervention, knee brace in place. Supine to sit with Supervision with HOB slightly elevated and use of bedrail. Sit to stand and transfers with RW with Supervision throughout session. Manual w/c propulsion 2 x 150 ft at Supervision level with focus on safely navigating chair on/off elevator and maneuvering through functional spaces. Ambulation x 140 ft, x 120 ft with RW and CGA for balance, flexed trunk posture, cues to keep RW closer to body during gait, and decreased L knee ROM noted during gait. Pt also fatigues quickly with ambulation and with w/c propulsion and requires extended seated rest breaks for recovery. Education with patient regarding energy conservation. Pt requests to return to bed at end of session. Sit to supine Supervision. Pt left seated in bed with needs in reach, bed alarm in place.  Therapy Documentation Precautions:  Precautions Precautions: Fall Precaution Comments: peg tube Restrictions Weight Bearing Restrictions: No      Therapy/Group: Individual Therapy   Peter Congo, PT, DPT, CSRS  10/31/2021, 5:17 PM

## 2021-10-31 NOTE — Progress Notes (Signed)
Patient is A&O x 4 and able to make his needs known. Patient educated and understands to continue to reposition Q 2 hours and as needed. Patient is able to reposition himself in his bed and in W/C. Patient coccyx is CDI, applied butt cream replaced foam dressing . Patient understands that he is NPO tonight at midnight. Sign applied to outside of the door. Call light is within reach, personal belongings within reach, safety maintained.

## 2021-10-31 NOTE — Progress Notes (Signed)
Occupational Therapy Session Note  Patient Details  Name: Raymond Stuart MRN: [id number] Date of Birth: [birth year]  Today's Date: 10/31/2021 OT Individual Time: 9924-2683 OT Individual Time Calculation (min): 56 min    Short Term Goals: Week 3:  OT Short Term Goal 1 (Week 3): STG = LTG due to ELOS  Skilled Therapeutic Interventions/Progress Updates:  Skilled OT intervention completed with focus on functional ambulation, activity tolerance and BUE strengthening exercises. Pt received semi-supine in bed, agreeable to session. Completed bed mobility and donning of both shoes with supervision. Sit > stand using RW, with supervision, then ambulatory transfer about 100 ft using RW and CGA with w/c follow for safety. Required intermittent cues during turns for foot placement, as pt tends to scissor, as well as cues to slow his pace and position his body under the RW vs heavy BUE reliance. Required seated rest break due to fatigue, transported to [facility] gym in w/c with total A for time. Participated in seated BUE strengthening exercises to promote increased UE endurance needed for functional transfers and self care tasks including the following:  Bicep flexion x12 each arm- orange band Shoulder flexion x12 each arm  - Orange band for RUE, just A/AAROM on LUE due to arthritis Horizontal abduction- orange band Alternating chest presses x25- green band  Pt required demonstrations, verbal cues and modifications for the LUE due to arthritic pain. Education provided about d/c plans and addressed pt concerns. Ambulatory transfer using RW with CGA and w/c follow about 100 ft, before needing seated rest break due to fatigue. Transported in w/c with total A in w/c back to room due to time. Sit > stand and stand pivot transfer using RW to EOB with CGA-supervision. Completed bed mobility with supervision. Pt left semi-supine, with bed alarm on activated and all needs in reach at end of session.  Therapy  Documentation Precautions:  Precautions Precautions: Fall Precaution Comments: peg tube Restrictions Weight Bearing Restrictions: No  Pain: No c/o pain   Therapy/Group: Individual Therapy  Dinora Hemm E Senita Corredor 10/31/2021, 7:52 AM

## 2021-10-31 NOTE — Progress Notes (Signed)
PROGRESS NOTE   Subjective/Complaints: No new complaint this morning Discussed with Nydia Bouton plan for pulling PEG tomorrow morning- made NPO at midnight  ROS: Denies CP, SOB, N/V/D  Objective:   No results found. Recent Labs    10/30/21 1224  WBC 8.0  HGB 14.2  HCT 43.5  PLT 250    Recent Labs    10/30/21 1224 10/31/21 0534  NA 135  --   K 3.8  --   CL 106  --   CO2 20*  --   GLUCOSE 92  --   BUN 16  --   CREATININE 0.94 0.84  CALCIUM 9.0  --       Intake/Output Summary (Last 24 hours) at 10/31/2021 1157 Last data filed at 10/31/2021 1000 Gross per 24 hour  Intake 360 ml  Output 450 ml  Net -90 ml     Pressure Injury 09/27/21 Coccyx Medial Stage 2 -  Partial thickness loss of dermis presenting as a shallow open injury with a red, pink wound bed without slough. stage 2, 1.5X.6 (Active)  09/27/21 1300  Location: Coccyx  Location Orientation: Medial  Staging: Stage 2 -  Partial thickness loss of dermis presenting as a shallow open injury with a red, pink wound bed without slough.  Wound Description (Comments): stage 2, 1.5X.6  Present on Admission: Yes    Physical Exam: Vital Signs Blood pressure 105/68, pulse 84, temperature 98.7 F (37.1 C), temperature source Oral, resp. rate 17, height 6' (1.829 m), weight 80.9 kg, SpO2 93 %. Constitutional: No distress . Vital signs reviewed. HENT: Normocephalic.  Atraumatic. Eyes: EOMI. No discharge. Cardiovascular: No JVD.  RRR. Respiratory: Normal effort.  No stridor.  Bilateral clear to auscultation. GI: Non-distended.  BS +.  + PEG. Skin: Warm and dry.  Stage 2 to coccyx, PEG C/D/I Normal mood.  Normal behavior. Musc: No edema in extremities.  No tenderness in extremities. Neuro: Alert Motor:  RUE 4+/5 deltoid- 5-/5 otherwise, unchanged LUE- deltoid 4/5; otherwise 4+/5, stable RLE- 5-/5 LLE- HF 1+-2-/5; KE 2/5, DF 4-/5   Assessment/Plan: 1.  Functional deficits which require 3+ hours per day of interdisciplinary therapy in a comprehensive inpatient rehab setting. Physiatrist is providing close team supervision and 24 hour management of active medical problems listed below. Physiatrist and rehab team continue to assess barriers to discharge/monitor patient progress toward functional and medical goals  Care Tool:  Bathing    Body parts bathed by patient: Right arm, Left arm, Chest, Abdomen, Right upper leg, Left upper leg, Face, Front perineal area, Buttocks, Right lower leg, Left lower leg   Body parts bathed by helper: Buttocks, Right lower leg, Left lower leg     Bathing assist Assist Level: Supervision/Verbal cueing     Upper Body Dressing/Undressing Upper body dressing   What is the patient wearing?: Pull over shirt    Upper body assist Assist Level: Set up assist    Lower Body Dressing/Undressing Lower body dressing      What is the patient wearing?: Pants, Incontinence brief     Lower body assist Assist for lower body dressing: Contact Guard/Touching assist     Toileting Toileting  Toileting assist Assist for toileting: Contact Guard/Touching assist     Transfers Chair/bed transfer  Transfers assist  Chair/bed transfer activity did not occur: Safety/medical concerns  Chair/bed transfer assist level: Supervision/Verbal cueing     Locomotion Ambulation   Ambulation assist   Ambulation activity did not occur: Safety/medical concerns  Assist level: Contact Guard/Touching assist Assistive device: Walker-rolling Max distance: 110ft   Walk 10 feet activity   Assist  Walk 10 feet activity did not occur: Safety/medical concerns  Assist level: Contact Guard/Touching assist Assistive device: Walker-rolling   Walk 50 feet activity   Assist Walk 50 feet with 2 turns activity did not occur: Safety/medical concerns  Assist level: Contact Guard/Touching assist Assistive device:  Walker-rolling    Walk 150 feet activity   Assist Walk 150 feet activity did not occur: Safety/medical concerns         Walk 10 feet on uneven surface  activity   Assist Walk 10 feet on uneven surfaces activity did not occur: Safety/medical concerns   Assist level: Minimal Assistance - Patient > 75% Assistive device: Development worker, international aid     Assist Is the patient using a wheelchair?: Yes Type of Wheelchair: Manual Wheelchair activity did not occur: Safety/medical concerns  Wheelchair assist level: Supervision/Verbal cueing Max wheelchair distance: 80    Wheelchair 50 feet with 2 turns activity    Assist    Wheelchair 50 feet with 2 turns activity did not occur: Safety/medical concerns   Assist Level: Supervision/Verbal cueing   Wheelchair 150 feet activity     Assist  Wheelchair 150 feet activity did not occur: Safety/medical concerns   Assist Level: Supervision/Verbal cueing   Blood pressure 105/68, pulse 84, temperature 98.7 F (37.1 C), temperature source Oral, resp. rate 17, height 6' (1.829 m), weight 80.9 kg, SpO2 93 %.    Medical Problem List and Plan: 1.  Debility secondary to sepsis/multi medical with history of traumatic subdural hematoma, traumatic intraparenchymal hemorrhage/occipital fracture, aspiration pneumonia, ischemic CVA  Continue CIR 2.  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 Given history of traumatic subdural hematoma and intraparenchymal hemorrhage will continue low dose aspirin and plan for repeat Doppler to assess propogation.  -Continue Lovenox             -antiplatelet therapy: Aspirin 81 mg daily 3. Pain: continue Voltaren gel, Lidoderm patch as directed, decrease oxycodone to 5mg  q6H as needed 4. Mood: Provide emotional support             -antipsychotic agents: N/A 5. Neuropsych: This patient is?  Fully capable of making decisions on his  own behalf. 6. Left buttock pressure injury: Routine skin checks, continue wound care.  7. Fluids/Electrolytes/Nutrition: Routine in and outs  8. Dysphagia/gastrostomy tube.   Gastrost[facility] 10/27 at [facility] hospital, pull PEG tomorrow Started on regular thins on 12/2 Jevity changed to as needed 9.  AKI.  Resolved 10.  Acute urinary retention/Pseudomonas Klebsiella UTI.  d/ced foley. I&O ordered PRN.  Completed course of IV Zosyn.    Decrease urecholine to 10mg  TID  Improving 11.  Hyperlipidemia.  Zocor 12. Thrush- Diflucan 200 mg x1 and 100 mg daily x 6 days and nystatin DC'd 13. Diarrhea- resolved, continue to monitor.  15. Dizziness with therapy: orthostatic vitals ordered, TEDs. Improved with epley maneuver.  16. Decreased appetite: decrease feeding supplement to BID. Continue Jevity supplement and d/c upon discharge 17.  Hyponatremia  Sodium 134 on 11/21, labs ordered for Monday 18. Sleep disturbance  Continue melatonin  Added magnesium gluconate 250mg  HS  Ordered therapeutic grounds pass to help reset circadian rhythm  Improving 19. Tachycardia: continue to monitor HR TID  Improving 20.  Mild acute blood loss anemia  Hemoglobin 12.4 in 11/16, labs ordered for Monday 21. GERD: transitioned protonix to po  LOS: 21 days A FACE TO FACE EVALUATION WAS PERFORMED  Tuesday P Goble Fudala 10/31/2021, 11:57 AM

## 2021-10-31 NOTE — Progress Notes (Signed)
Speech Language Pathology Daily Session Note  Patient Details  Name: Raymond Stuart MRN: [id number] Date of Birth: Apr 01, 1951  Today's Date: 10/31/2021 SLP Individual Time: 5701-7793 SLP Individual Time Calculation (min): 30 min  Short Term Goals: Week 3: SLP Short Term Goal 1 (Week 3): STG = LTG d/t ELOS  Skilled Therapeutic Interventions: Pt seen for skilled ST with focus on cognitive goal, pt pleasant and agreeable to therapeutic tasks. Pt very proud of his independence with ADLs this AM (brushing teeth, sock aid to put socks on, etc). Pt utilizing bed rail for supine to sit EOB. Pt completing moderately complex alternating attention task with Mod I. Pt independent for sit to supine, left in bed with alarm set and all needs within reach. Cont ST POC.   Pain Pain Assessment Pain Scale: 0-10 Pain Score: 0-No pain  Therapy/Group: Individual Therapy  Tacey Ruiz 10/31/2021, 11:11 AM

## 2021-11-01 ENCOUNTER — Inpatient Hospital Stay (HOSPITAL_COMMUNITY): Payer: Medicare Other

## 2021-11-01 MED ORDER — LIDOCAINE HCL (PF) 1 % IJ SOLN
5.0000 mL | Freq: Once | INTRAMUSCULAR | Status: AC
Start: 2021-11-01 — End: 2021-11-01
  Administered 2021-11-01: 5 mL via INTRADERMAL
  Filled 2021-11-01: qty 5

## 2021-11-01 MED ORDER — DIATRIZOATE MEGLUMINE & SODIUM 66-10 % PO SOLN
ORAL | Status: AC
  Administered 2021-11-01: 30 mL
  Filled 2021-11-01: qty 30

## 2021-11-01 MED ORDER — LIDOCAINE HCL 1 % IJ SOLN
5.0000 mL | Freq: Once | INTRAMUSCULAR | Status: DC
  Filled 2021-11-01: qty 5

## 2021-11-01 NOTE — Progress Notes (Signed)
Occupational Therapy Session Note  Patient Details  Name: Raymond Stuart MRN: [id number] Date of Birth: 1951-09-28  Today's Date: 11/01/2021 OT Individual Time: 8657-8469 OT Individual Time Calculation (min): 68 min    Short Term Goals: Week 3:  OT Short Term Goal 1 (Week 3): STG = LTG due to ELOS  Skilled Therapeutic Interventions/Progress Updates:  Skilled OT intervention completed with focus on ADL retraining, activity tolerance and functional ambulation. Pt received supine in bed, agreeable to session. Reported that nurse informed pt that MD would be in to remove peg tube, checked in with RN with report that they will visit at any time. Given the timing, pt opted to complete sponge bathing with no-rinse pads vs shower. Completed bed mobility with mod I, then sat EOB to complete bathing with set up A. UB dressing with set up A, and LB dressing with supervision for sit > stand using RW to donn pants over hips. Pt donned knee brace with supervision, with pt having questions/needing cues about orienting the brace and which way to loop straps. Able to donn R sock with supervision using figure 4 position, and donned L sock with sock aid with mod I. Requested to incorporate functional ambulation into session, with pt able to sit > stand using RW with supervision with bed level almost all the way down. To promote increased activity tolerance needed for self-care tasks, pt ambulated with RW and w/c follow for rest as needed about 100 ft, with focus on making wide vs narrow turns as well as stride length with RW positioning. Cues needed to bring RW closer to body, to decrease stride length for preventing RW from being too far in front of him vs underneath him Encouraged pt to attempt leading with LLE- pt reporting he feels like he does better with relying on his legs during walking by leading with LLE vs leaning on RW when leading with RLE. MD in hallway to report pt needed back to room to remove peg tube. Pt  with fatigue, returned seated in w/c, with total A transportation back to room. Completed sit > stand and stand pivot using RW with supervision to EOB. Completed EOB > supine in bed with mod I. Pt left supine in bed, with MD and RN in room at bed side to remove peg tube, at end of session.  Therapy Documentation Precautions:  Precautions Precautions: Fall Precaution Comments: peg tube Restrictions Weight Bearing Restrictions: No  Pain: No c/o pain   Therapy/Group: Individual Therapy  Anjolie Majer E Anquinette Pierro 11/01/2021, 7:30 AM

## 2021-11-01 NOTE — Progress Notes (Signed)
Occupational Therapy Session Note  Patient Details  Name: Raymond Stuart MRN: [id number] Date of Birth: [birth year]  Today's Date: 11/01/2021 OT Individual Time: 1445-1530 OT Individual Time Calculation (min): 45 min    Short Term Goals: Week 3:  OT Short Term Goal 1 (Week 3): STG = LTG due to ELOS  Skilled Therapeutic Interventions/Progress Updates:  Skilled OT intervention completed with focus on d/c planning, family education with hands on practice with wife present, functional transfers and self-care tasks. Pt received supine in bed, using urinal with wife in room. Pt agreeable to session. Discussion with family on any questions regarding d/c home. Pt with request to ambulate during session due to limited sessions OOB due to multiple attempts and visits by staff to remove peg tube. Bed mobility with mod I, donned shoes with set up A, sit > stand and ambulatory transfer using RW with supervision to sink. Pt completed grooming tasks in standing for increased activity tolerance using one UE for balance on counter with occasional BUE unsupported with eyes closed during face washing with pt needing CGA. Educated on safety for standing during grooming tasks if completed at home. Pt completed about 100 ft total of functional ambulation using RW with CGA and pt's wife with w/c follow for safety/rest break, to promote increased activity tolerance. Pt with increased distraction this session during ambulation, with commotion in hallway and presumably wife being present. Educated on safety techniques to incorporate during community reintegration, as well as educated pt's wife on pt's CLOF, and that increased stimuli in the environment might challenge pt during concentration or higher level transfers. Had pt's wife assist pt at [facility] level during ambulation back to room, as well as 2 sit > stands for hands on practice with transfers, with pt's wife demonstrating good safety awareness and handling skills throughout  with cues provided to allow pt to complete the transfer but to provide touching-supervision assist for safety. Pt request to toilet, with pt left set up on [facility] over toilet in room for BM, with wife present. Pt's wife not cleared to assist pt out of bathroom and informed her to call for RN if prolonged wait once pt finished. RN notified of pt's current status.   Therapy Documentation Precautions:  Precautions Precautions: Fall Precaution Comments: peg tube Restrictions Weight Bearing Restrictions: No  Pain: No c/o pain   Therapy/Group: Individual Therapy  [name] 11/01/2021, 4:17 PM

## 2021-11-01 NOTE — Patient Care Conference (Signed)
Inpatient RehabilitationTeam Conference and Plan of Care Update Date: 11/01/2021   Time: 11:41 AM    Patient Name: Raymond Stuart      Medical Record Number: [id number]  Date of Birth: 30-Oct-1951 Sex: Male         Room/Bed: 5C04C/5C04C-01 Payor Info: Payor: Multimedia programmer / Plan: [employer] MEDICARE / Product Type: *No Product type* /    Admit Date/Time:  10/10/2021  1:52 PM  Primary Diagnosis:  Sepsis due to undetermined organism [facility])  Hospital Problems: Principal Problem:   Sepsis due to undetermined organism [facility]) Active Problems:   Type 2 diabetes mellitus with hyperglycemia (HCC)   Protein-calorie malnutrition, severe   Ischemic stroke (HCC)   PEG (percutaneous endoscopic gastrostomy) status (HCC)   Sepsis (HCC)   Hyponatremia   Dyslipidemia   Oral thrush   Sleep disturbance   Acute blood loss anemia    Expected Discharge Date: Expected Discharge Date: 11/04/21  Team Members Present: Physician leading conference: Dr. Sula Soda Social Worker Present: Lavera Guise, BSW Nurse Present: Chana Bode, RN PT Present: Raechel Chute, PT OT Present: Other (comment) [facility] Cheyenne Adas, OT) SLP Present: Elio Forget, SLP PPS Coordinator present : Edson Snowball, PT     Current Status/Progress Goal Weekly Team Focus  Bowel/Bladder   pt is cont of B/b. LBM 12/4  Pt remain cont  Toileting   Swallow/Nutrition/ Hydration             ADL's   Supervision bathing, set up UBD, CGA LBD, CGA-min A toileting, functional transfers- ambulatory with CGA-supervision using RW  Supervision/mod I  functional transfers, endurance, BUE strengthening HEP, d/c planning   Mobility   bed mobility supervision, transfers with RW CGA/supervision, gait 175ft with RW CGA, 4 steps 2 rails min A  supervision/mod I  functional mobilty/transfers, generalized strengthening, dynamic standing balance and standing tolerance, D/C planning, and improved endurance with activity.   Communication              Safety/Cognition/ Behavioral Observations            Pain   Pt states pain at 6 out of 10  Pt pain < 3 on a 10 point scale  Assess pt for pain qshift and provide PRN as needed   Skin   Pt has a pink area on bottom covered by foam  Skin is free of injury or breakdown  Skin care qshift     Discharge Planning:  Patient discharging home with spouse and children to assist. 24/7 supervision up to Min A   Team Discussion: Patient progressing well and on target for discharge. PEG removal planned for today. Continue to note poor appetite; encourage po intake since PEG being removed.   Patient on target to meet rehab goals: yes, currently supervision for self care and transfers.  Able to ambulate up to 118' and manage 4 steps with min assist.   *See Care Plan and progress notes for long and short-term goals.   Revisions to Treatment Plan:  N/A   Teaching Needs: Safety, medications, dietary modifications, secondary risk management, etc.   Current Barriers to Discharge: Decreased caregiver support and learned helplessness.  Possible Resolutions to Barriers: Family education     Medical Summary Current Status: PEG in place, appetite improved, impaired mobility, pain, insomnia, dizziness  Barriers to Discharge: Medical stability  Barriers to Discharge Comments: PEG in place, impaired mobility, pain, insomnia, dizziness Possible Resolutions to [employer] Focus: PEG removal today, continue lovenox, continue oxycodone as needed, continue  melatonin, continue meclizine   Continued Need for Acute Rehabilitation Level of Care: The patient requires daily medical management by a physician with specialized training in physical medicine and rehabilitation for the following reasons: Direction of a multidisciplinary physical rehabilitation program to maximize functional independence : Yes Medical management of patient stability for increased activity during participation in an intensive  rehabilitation regime.: Yes Analysis of laboratory values and/or radiology reports with any subsequent need for medication adjustment and/or medical intervention. : Yes   I attest that I was present, lead the team conference, and concur with the assessment and plan of the team.   Chana Bode B 11/01/2021, 2:47 PM

## 2021-11-01 NOTE — Progress Notes (Signed)
Speech Language Pathology Daily Session Note  Patient Details  Name: Chrstopher Malenfant MRN: [id number] Date of Birth: 05-02-1951  Today's Date: 11/01/2021 SLP Individual Time: 1015-1055 SLP Individual Time Calculation (min): 40 min  Short Term Goals: Week 3: SLP Short Term Goal 1 (Week 3): STG = LTG d/t ELOS  Skilled Therapeutic Interventions:   Patient seen for skilled ST session focused on cognitive goals. Patient lying in bed, pleasant and agreeable to session. Patient has been NPO since previous night secondary to planned PEG removal and he reported he is feeling hungry and that is the first time in past couple months that he has felt that way. Per patient, PEG unable to be removed earlier this morning and surgery was consulted. SLP led patient through discussion of anticipated needs at home. He stated one concern/question regarding a couple area rugs in living and bed room that could be potentially difficult to ambulate on. He reported he plans to walk on his driveway at home for exercise and SLP recommended he keep record of his progress (he did report his daughter had already gotten him a fitness tracker watch). Patient requested SLP write down information we discussed as he did not want to forget. Patient more engaged in discussion overall. He was left in bed with surgical PA in room to assess his PEG tube for potential removal. He continues to benefit from skilled SLP intervention to maximize cognitive and swallow function goals prior to discharge.  Pain Pain Assessment Pain Scale: 0-10 Pain Score: 0-No pain  Therapy/Group: Individual Therapy  Angela Nevin, MA, CCC-SLP Speech Therapy

## 2021-11-01 NOTE — Progress Notes (Signed)
PROGRESS NOTE   Subjective/Complaints: No new complaints this morning Tried to remove PEG but [place] and surgery were not available to- we have been recommended to refer to [facility]  ROS: Denies CP, SOB, N/V/D  Objective:   DG ABDOMEN PEG TUBE LOCATION  Result Date: 11/01/2021 CLINICAL DATA:  Check gastrostomy tube EXAM: ABDOMEN - 1 VIEW COMPARISON:  None. FINDINGS: Contrast injected through the gastrostomy tube is seen in the lumen of stomach. There is no demonstrable extravasation of contrast. Bowel gas pattern is nonspecific. IMPRESSION: Tip of gastrostomy tube appears to be in the lumen of stomach. Electronically Signed   By: Ernie Avena M.D.   On: 11/01/2021 11:55   Recent Labs    10/30/21 1224  WBC 8.0  HGB 14.2  HCT 43.5  PLT 250    Recent Labs    10/30/21 1224 10/31/21 0534  NA 135  --   K 3.8  --   CL 106  --   CO2 20*  --   GLUCOSE 92  --   BUN 16  --   CREATININE 0.94 0.84  CALCIUM 9.0  --       Intake/Output Summary (Last 24 hours) at 11/01/2021 1424 Last data filed at 11/01/2021 0700 Gross per 24 hour  Intake 117 ml  Output 600 ml  Net -483 ml         Physical Exam: Vital Signs Blood pressure 110/73, pulse 86, temperature 98.5 F (36.9 C), temperature source Oral, resp. rate 17, height 6' (1.829 m), weight 79 kg, SpO2 98 %. Gen: no distress, normal appearing HEENT: oral mucosa pink and moist, NCAT Cardio: Reg rate Chest: normal effort, normal rate of breathing GI: Non-distended.  BS +.  + PEG. Skin: Warm and dry.  Stage 2 to coccyx, PEG C/D/I Normal mood.  Normal behavior. Musc: No edema in extremities.  No tenderness in extremities. Neuro: Alert Motor:  RUE 4+/5 deltoid- 5-/5 otherwise, unchanged LUE- deltoid 4/5; otherwise 4+/5, stable RLE- 5-/5 LLE- HF 1+-2-/5; KE 2/5, DF 4-/5   Assessment/Plan: 1. Functional deficits which require 3+ hours per day of interdisciplinary therapy  in a comprehensive inpatient rehab setting. Physiatrist is providing close team supervision and 24 hour management of active medical problems listed below. Physiatrist and rehab team continue to assess barriers to discharge/monitor patient progress toward functional and medical goals  Care Tool:  Bathing    Body parts bathed by patient: Right arm, Left arm, Chest, Abdomen, Right upper leg, Left upper leg, Face, Right lower leg, Left lower leg   Body parts bathed by helper: Buttocks, Right lower leg, Left lower leg     Bathing assist Assist Level: Set up assist     Upper Body Dressing/Undressing Upper body dressing   What is the patient wearing?: Pull over shirt    Upper body assist Assist Level: Independent with assistive device    Lower Body Dressing/Undressing Lower body dressing      What is the patient wearing?: Pants     Lower body assist Assist for lower body dressing: Supervision/Verbal cueing     Toileting Toileting    Toileting assist Assist for toileting: Contact Guard/Touching assist  Transfers Chair/bed transfer  Transfers assist  Chair/bed transfer activity did not occur: Safety/medical concerns  Chair/bed transfer assist level: Supervision/Verbal cueing     Locomotion Ambulation   Ambulation assist   Ambulation activity did not occur: Safety/medical concerns  Assist level: Contact Guard/Touching assist Assistive device: Walker-rolling Max distance: 140'   Walk 10 feet activity   Assist  Walk 10 feet activity did not occur: Safety/medical concerns  Assist level: Contact Guard/Touching assist Assistive device: Walker-rolling   Walk 50 feet activity   Assist Walk 50 feet with 2 turns activity did not occur: Safety/medical concerns  Assist level: Contact Guard/Touching assist Assistive device: Walker-rolling    Walk 150 feet activity   Assist Walk 150 feet activity did not occur: Safety/medical concerns         Walk 10  feet on uneven surface  activity   Assist Walk 10 feet on uneven surfaces activity did not occur: Safety/medical concerns   Assist level: Minimal Assistance - Patient > 75% Assistive device: Walker-rolling   Wheelchair     Assist Is the patient using a wheelchair?: Yes Type of Wheelchair: Manual Wheelchair activity did not occur: Safety/medical concerns  Wheelchair assist level: Supervision/Verbal cueing Max wheelchair distance: 150'    Wheelchair 50 feet with 2 turns activity    Assist    Wheelchair 50 feet with 2 turns activity did not occur: Safety/medical concerns   Assist Level: Supervision/Verbal cueing   Wheelchair 150 feet activity     Assist  Wheelchair 150 feet activity did not occur: Safety/medical concerns   Assist Level: Supervision/Verbal cueing   Blood pressure 110/73, pulse 86, temperature 98.5 F (36.9 C), temperature source Oral, resp. rate 17, height 6' (1.829 m), weight 79 kg, SpO2 98 %.    Medical Problem List and Plan: 1.  Debility secondary to sepsis/multi medical with history of traumatic subdural hematoma, traumatic intraparenchymal hemorrhage/occipital fracture, aspiration pneumonia, ischemic CVA  Contine CIR 2.  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 Given history of traumatic subdural hematoma and intraparenchymal hemorrhage will continue low dose aspirin and plan for repeat Doppler to assess propogation.  -Continue Lovenox             -antiplatelet therapy: Aspirin 81 mg daily 3. Pain: continue Voltaren gel, Lidoderm patch as directed, decrease oxycodone to 5mg  q6H as needed 4. Mood: Provide emotional support             -antipsychotic agents: N/A 5. Neuropsych: This patient is?  Fully capable of making decisions on his own behalf. 6. Left buttock pressure injury: Routine skin checks, continue wound care.  7. Fluids/Electrolytes/Nutrition: Routine in and  outs  8. Dysphagia/gastrostomy tube.   Gastrostomy tube placed 10/27 at [facility], [facility] and surgery tried to remove today without success, may need to refer outpatient to [facility] for removal Started on regular thins on 12/2 Jevity changed to as needed 9.  AKI.  Resolved 10.  Acute urinary retention/Pseudomonas Klebsiella UTI.  d/ced foley. I&O ordered PRN.  Completed course of IV Zosyn.    Decrease urecholine to 10mg  TID  Improving 11.  Hyperlipidemia.  Zocor 12. Thrush- Diflucan 200 mg x1 and 100 mg daily x 6 days and nystatin DC'd 13. Diarrhea- resolved, continue to monitor.  15. Dizziness with therapy: orthostatic vitals ordered, TEDs. Improved with epley maneuver. Continues on meclizine.  16. Decreased appetite: decrease feeding supplement to BID. D/c Jevity 17.  Hyponatremia  Sodium 134 on 11/21, labs ordered for Monday 18. Sleep disturbance  Continue melatonin  Added magnesium gluconate 250mg  HS  Ordered therapeutic grounds pass to help reset circadian rhythm  Improving 19. Tachycardia: continue to monitor HR TID  Improving 20.  Mild acute blood loss anemia  Hemoglobin 12.4 in 11/16, labs ordered for Monday 21. GERD: transitioned protonix to po  >35 minutes spent in trying to remove PEG, evaluating ambulation, consulting surgery to remove PEG, discussing recommendations with surgery, evaluating patient's current medications  LOS: 22 days A FACE TO FACE EVALUATION WAS PERFORMED  Thursday P Mayjor Ager 11/01/2021, 2:24 PM

## 2021-11-01 NOTE — Progress Notes (Addendum)
Patient is A@O  x 4 and able to make huis needs known. Patient educated on h20 intake,. Eat foods high in protein. Patient and husband given handouts on S/S of sepsis  and left knee brace. Family has no concerns at this time. Call light and personal items within reach. Bed in low position.

## 2021-11-01 NOTE — Progress Notes (Signed)
Occupational Therapy Session Note  Patient Details  Name: Raymond Stuart MRN: [id number] Date of Birth: 01/18/1951  Today's Date: 11/01/2021 OT Individual Time:9:42-1015 OT Individual Time Calculation (min): 35 min    Short Term Goals: Week 1:  OT Short Term Goal 1 (Week 1): Pt will completed sit > stand with Max A x1 in prep for functional ADL OT Short Term Goal 1 - Progress (Week 1): Met OT Short Term Goal 2 (Week 1): Pt will complete UBD with mod A OT Short Term Goal 2 - Progress (Week 1): Met OT Short Term Goal 3 (Week 1): Pt will complete LBD with max A OT Short Term Goal 3 - Progress (Week 1): Progressing toward goal Week 2:  OT Short Term Goal 1 (Week 2): Pt will complete LBD with max A OT Short Term Goal 1 - Progress (Week 2): Met OT Short Term Goal 2 (Week 2): Pt will complete toileitng tasks with Max A OT Short Term Goal 2 - Progress (Week 2): Met OT Short Term Goal 3 (Week 2): Pt will maintain standing for functional task for at least 3 mins to promote increased endurance OT Short Term Goal 3 - Progress (Week 2): Met Week 3:  OT Short Term Goal 1 (Week 3): STG = LTG due to ELOS  Skilled Therapeutic Interventions/Progress Updates:    Patient in bed upon arrival awaiting nursing care to remove peg tube.  Patient remained at bed level and completed UB theraex, he completed AROM while in supine in various planes as a precursor to theraex, the pt went on to complete bicep curls, shld flexion, and horizontal abduction using a 3lb dumb bell 2 sets of 15 with rest breaks as needed to improve upon safe and independent function with BADL related task. The pt was instructed  in relaxation breathing and was able to verbalize the benefits of relaxation breathing to improve compliance.  The medical team returned and upon examination opted to revisit the removal of the pt's peg tube at a later date.  The pt remained in bed with his bedside table within reach and with his call light and all  additional needs addressed.   Therapy Documentation Precautions:  Precautions Precautions: Fall Precaution Comments: peg tube Restrictions Weight Bearing Restrictions: No General:   Vital Signs:   Pain: Pain Assessment Pain Score: 2   Vision   Perception    Praxis   Balance   Exercises:   Other Treatments:     Therapy/Group: Individual Therapy  Yvonne Kendall 11/01/2021, 10:49 AM

## 2021-11-02 ENCOUNTER — Other Ambulatory Visit (HOSPITAL_COMMUNITY): Payer: Self-pay

## 2021-11-02 MED ORDER — SACCHAROMYCES BOULARDII 250 MG PO CAPS
250.0000 mg | ORAL_CAPSULE | Freq: Two times a day (BID) | ORAL | Status: DC
  Administered 2021-11-02 – 2021-11-04 (×5): 250 mg via ORAL
  Filled 2021-11-02 (×5): qty 1

## 2021-11-02 MED ORDER — JUVEN PO PACK
1.0000 | PACK | Freq: Two times a day (BID) | ORAL | Status: DC
  Administered 2021-11-02 – 2021-11-04 (×2): 1 via ORAL
  Filled 2021-11-02 (×4): qty 1

## 2021-11-02 MED ORDER — SACCHAROMYCES BOULARDII 250 MG PO CAPS
250.0000 mg | ORAL_CAPSULE | Freq: Two times a day (BID) | ORAL | 0 refills | Status: AC
  Filled 2021-11-02: qty 20, 10d supply, fill #0

## 2021-11-02 MED ORDER — OXYCODONE HCL 5 MG PO TABS
5.0000 mg | ORAL_TABLET | Freq: Four times a day (QID) | ORAL | Status: DC | PRN
  Administered 2021-11-02 – 2021-11-03 (×5): 5 mg via ORAL
  Filled 2021-11-02 (×5): qty 1

## 2021-11-02 MED ORDER — MECLIZINE HCL 25 MG PO TABS
12.5000 mg | ORAL_TABLET | Freq: Two times a day (BID) | ORAL | Status: DC
  Administered 2021-11-02 – 2021-11-04 (×4): 12.5 mg via ORAL
  Filled 2021-11-02 (×4): qty 1

## 2021-11-02 MED ORDER — BETHANECHOL CHLORIDE 10 MG PO TABS
10.0000 mg | ORAL_TABLET | Freq: Three times a day (TID) | ORAL | Status: DC
  Administered 2021-11-02 – 2021-11-04 (×7): 10 mg via ORAL
  Filled 2021-11-02 (×7): qty 1

## 2021-11-02 MED ORDER — TAMSULOSIN HCL 0.4 MG PO CAPS
0.4000 mg | ORAL_CAPSULE | Freq: Every day | ORAL | 0 refills | Status: DC
  Filled 2021-11-02: qty 30, 30d supply, fill #0

## 2021-11-02 MED ORDER — ACETAMINOPHEN 160 MG/5ML PO SOLN: 650.0000 mg | Freq: Four times a day (QID) | ORAL | Status: DC | PRN

## 2021-11-02 MED ORDER — BETHANECHOL CHLORIDE 10 MG PO TABS
10.0000 mg | ORAL_TABLET | Freq: Three times a day (TID) | ORAL | 0 refills | Status: DC
  Filled 2021-11-02: qty 21, 7d supply, fill #0

## 2021-11-02 MED ORDER — ACETAMINOPHEN 160 MG/5ML PO SOLN: 650.0000 mg | Freq: Four times a day (QID) | ORAL | 0 refills | Status: DC | PRN

## 2021-11-02 MED ORDER — HYDROCOD POLST-CPM POLST ER 10-8 MG/5ML PO SUER: 5.0000 mL | Freq: Two times a day (BID) | ORAL | Status: DC | PRN

## 2021-11-02 MED ORDER — MAGNESIUM OXIDE 400 MG PO TABS
400.0000 mg | ORAL_TABLET | Freq: Every day | ORAL | 0 refills | Status: DC
  Filled 2021-11-02: qty 30, 30d supply, fill #0
  Filled 2021-11-02: qty 30, 60d supply, fill #0

## 2021-11-02 MED ORDER — VITAMIN D3 25 MCG PO TABS
1000.0000 [IU] | ORAL_TABLET | Freq: Every morning | ORAL | 0 refills | Status: AC
  Filled 2021-11-02: qty 30, 30d supply, fill #0

## 2021-11-02 MED ORDER — SIMVASTATIN 40 MG PO TABS
40.0000 mg | ORAL_TABLET | Freq: Every evening | ORAL | 0 refills | Status: DC
  Filled 2021-11-02: qty 30, 30d supply, fill #0

## 2021-11-02 MED ORDER — OXYCODONE HCL 5 MG PO TABS
5.0000 mg | ORAL_TABLET | Freq: Four times a day (QID) | ORAL | 0 refills | Status: DC | PRN
  Filled 2021-11-02: qty 20, 5d supply, fill #0

## 2021-11-02 MED ORDER — PANTOPRAZOLE SODIUM 40 MG PO TBEC
40.0000 mg | DELAYED_RELEASE_TABLET | Freq: Every day | ORAL | 0 refills | Status: DC
  Filled 2021-11-02: qty 30, 30d supply, fill #0

## 2021-11-02 MED ORDER — MECLIZINE HCL 12.5 MG PO TABS
12.5000 mg | ORAL_TABLET | Freq: Three times a day (TID) | ORAL | 0 refills | Status: DC
  Filled 2021-11-02: qty 90, 30d supply, fill #0

## 2021-11-02 MED ORDER — SIMVASTATIN 20 MG PO TABS
40.0000 mg | ORAL_TABLET | Freq: Every evening | ORAL | Status: DC
  Administered 2021-11-02 – 2021-11-03 (×2): 40 mg via ORAL
  Filled 2021-11-02 (×2): qty 2

## 2021-11-02 MED ORDER — LIDOCAINE 5 % EX PTCH
1.0000 | MEDICATED_PATCH | CUTANEOUS | 0 refills | Status: DC
  Filled 2021-11-02: qty 30, 30d supply, fill #0

## 2021-11-02 MED ORDER — DICLOFENAC SODIUM 1 % EX GEL
2.0000 g | Freq: Four times a day (QID) | CUTANEOUS | 0 refills | Status: DC | PRN
  Filled 2021-11-02: qty 100, 12d supply, fill #0

## 2021-11-02 MED ORDER — ASPIRIN 81 MG PO CHEW
81.0000 mg | CHEWABLE_TABLET | Freq: Every day | ORAL | Status: DC
  Administered 2021-11-02 – 2021-11-04 (×3): 81 mg via ORAL
  Filled 2021-11-02 (×3): qty 1

## 2021-11-02 MED ORDER — MELATONIN 3 MG PO TABS
3.0000 mg | ORAL_TABLET | Freq: Every day | ORAL | 0 refills | Status: DC
  Filled 2021-11-02: qty 30, 30d supply, fill #0

## 2021-11-02 MED ORDER — GUAIFENESIN 100 MG/5ML PO LIQD: 5.0000 mL | ORAL | Status: DC | PRN

## 2021-11-02 MED ORDER — ASPIRIN 81 MG PO CHEW: 81.0000 mg | CHEWABLE_TABLET | Freq: Every day | ORAL | Status: AC

## 2021-11-02 NOTE — Progress Notes (Signed)
Occupational Therapy Session Note  Patient Details  Name: Raymond Stuart MRN: [id number] Date of Birth: [birth year]  Today's Date: 11/02/2021 OT Group Time: 1430-1530 OT Group Time Calculation (min): 60 min   Short Term Goals: Week 3:  OT Short Term Goal 1 (Week 3): STG = LTG due to ELOS  Skilled Therapeutic Interventions/Progress Updates:  Pt participated in group session with a focus on BUE strength and endurance to facilitate improved activity tolerance and strength for higher level BADLs and functional mobility tasks. Pt first in engaged in seated UB therapeutic activity where pt was instructed to roll large dice when it was pts turn. Each number rolled correlated with UB therex/ number of repetitions  listed on board. UB therex included flys, chest presses, punches, bicep curls, upward rows, over heard presses with a range of reps from 10-20.  Pt utilized 2 lb hand weights during session. Pt utilizes modifications such as decreasing amt of weight as needed, completing therex unilaterally and taking rest breaks as needed. Education provided on importance of determining appropriate modifications that benefited each pt in order to meet pts specific needs.  Ended with 2 mins of guided deep breathing where pts were instructed on sequence of deep breathing and benefits of deep breathing to accommodate for stress, anxiety and relaxation. Remainder of session to focus on BUE strength and endurance with pts passing ball around circle. Graded task up with pts instructing to complete chest passes's, volley passes, and  higher level motor planning pass with pt tossing ball up in air, clapping once and passing to next group participant. Pt transported back to room by this OTA where pt completed stand pivot transfer back to bed with CGA, sit>supine with supervision. Pt left supine in bed with bed alarm activated and all needs within reach.   Therapy Documentation Precautions:  Precautions Precautions:  Fall Precaution Comments: peg tube Restrictions Weight Bearing Restrictions: No  Pain: no pain reported during session     Therapy/Group: Group Therapy  Barron Schmid 11/02/2021, 4:14 PM

## 2021-11-02 NOTE — Progress Notes (Signed)
Speech Language Pathology Daily Session Note  Patient Details  Name: Raymond Stuart MRN: [id number] Date of Birth: 09-20-1951  Today's Date: 11/02/2021 SLP Individual Time: 1350-1430 SLP Individual Time Calculation (min): 40 min  Short Term Goals: Week 3: SLP Short Term Goal 1 (Week 3): STG = LTG d/t ELOS  Skilled Therapeutic Interventions:   Patient seen for skilled ST session focused on cognitive function goals and upcoming discharge home on 12/10. Patient did report some frustration that PEG was not able to be removed but he was appreciative of the time and effort that PA and MD's had put in to trying to determine type of PEG (was put in at a different hospital). Patient participated in page-length reading task and was able to summarize story and answer questions (inferential and factual based) without difficulty. SLP then showed patient a website ([email or web address]) that patient could use when home to work on his cognition as this a type of thing he would do prior to hospitalizations. Patient said reported feeling that he was "behind" on things he was doing prior to hospitalization but he does appear motivated to return to activities and daily routines he was used to previously. SLP then brought patient downstairs to therapy group and he was left with OT and rehab tech. Patient continues to benefit from skilled SLP intervention to maximize cognitive function goals prior to discharge.  Pain Pain Assessment Pain Scale: 0-10 Pain Score: 0-No pain  Therapy/Group: Individual Therapy  Angela Nevin, MA, CCC-SLP Speech Therapy

## 2021-11-02 NOTE — Progress Notes (Signed)
Patient ID: Raymond Stuart, male   DOB: 25-May-1951, 70 y.o.   MRN: [id number]  [facility] referral sent to [facility]

## 2021-11-02 NOTE — Progress Notes (Signed)
PROGRESS NOTE   Subjective/Complaints: Feeling soreness at PEG site from removal attempts yesterday. Notes that PEG was placed at [facility]. Wife is trying to contact surgeon there.  Dizziness greatly improved with the Epley maneuver.  ROS: Denies CP, SOB, N/V/D, dizziness improved  Objective:   DG ABDOMEN PEG TUBE LOCATION  Result Date: 11/01/2021 CLINICAL DATA:  Check gastrostomy tube EXAM: ABDOMEN - 1 VIEW COMPARISON:  None. FINDINGS: Contrast injected through the gastrostomy tube is seen in the lumen of stomach. There is no demonstrable extravasation of contrast. Bowel gas pattern is nonspecific. IMPRESSION: Tip of gastrostomy tube appears to be in the lumen of stomach. Electronically Signed   By: Ernie Avena M.D.   On: 11/01/2021 11:55   No results for input(s): WBC, HGB, HCT, PLT in the last 72 hours.   Recent Labs    10/31/21 0534  CREATININE 0.84      Intake/Output Summary (Last 24 hours) at 11/02/2021 1632 Last data filed at 11/02/2021 1254 Gross per 24 hour  Intake 290 ml  Output 850 ml  Net -560 ml         Physical Exam: Vital Signs Blood pressure 101/82, pulse 86, temperature 98.2 F (36.8 C), temperature source Oral, resp. rate 18, height 6' (1.829 m), weight 79 kg, SpO2 95 %. Gen: no distress, normal appearing HEENT: oral mucosa pink and moist, NCAT Cardio: Reg rate Chest: normal effort, normal rate of breathing GI: Non-distended.  BS +.  + PEG. Skin: Warm and dry.  Stage 2 to coccyx, PEG C/D/I Normal mood.  Normal behavior. Musc: No edema in extremities.  No tenderness in extremities. Neuro: Alert, great motivation Motor:  RUE 4+/5 deltoid- 5-/5 otherwise, unchanged LUE- deltoid 4/5; otherwise 4+/5, stable RLE- 5-/5 LLE- HF 1+-2-/5; KE 2/5, DF 4-/5   Assessment/Plan: 1. Functional deficits which require 3+ hours per day of interdisciplinary therapy in a comprehensive inpatient rehab  setting. Physiatrist is providing close team supervision and 24 hour management of active medical problems listed below. Physiatrist and rehab team continue to assess barriers to discharge/monitor patient progress toward functional and medical goals  Care Tool:  Bathing    Body parts bathed by patient: Right arm, Left arm, Chest, Abdomen, Right upper leg, Left upper leg, Face, Right lower leg, Left lower leg   Body parts bathed by helper: Buttocks, Right lower leg, Left lower leg     Bathing assist Assist Level: Set up assist     Upper Body Dressing/Undressing Upper body dressing   What is the patient wearing?: Pull over shirt    Upper body assist Assist Level: Independent with assistive device    Lower Body Dressing/Undressing Lower body dressing      What is the patient wearing?: Pants     Lower body assist Assist for lower body dressing: Supervision/Verbal cueing     Toileting Toileting    Toileting assist Assist for toileting: Contact Guard/Touching assist     Transfers Chair/bed transfer  Transfers assist  Chair/bed transfer activity did not occur: Safety/medical concerns  Chair/bed transfer assist level: Supervision/Verbal cueing     Locomotion Ambulation   Ambulation assist   Ambulation activity did not  occur: Safety/medical concerns  Assist level: Contact Guard/Touching assist Assistive device: Walker-rolling Max distance: 140'   Walk 10 feet activity   Assist  Walk 10 feet activity did not occur: Safety/medical concerns  Assist level: Contact Guard/Touching assist Assistive device: Walker-rolling   Walk 50 feet activity   Assist Walk 50 feet with 2 turns activity did not occur: Safety/medical concerns  Assist level: Contact Guard/Touching assist Assistive device: Walker-rolling    Walk 150 feet activity   Assist Walk 150 feet activity did not occur: Safety/medical concerns         Walk 10 feet on uneven surface   activity   Assist Walk 10 feet on uneven surfaces activity did not occur: Safety/medical concerns   Assist level: Minimal Assistance - Patient > 75% Assistive device: Walker-rolling   Wheelchair     Assist Is the patient using a wheelchair?: Yes Type of Wheelchair: Manual Wheelchair activity did not occur: Safety/medical concerns  Wheelchair assist level: Supervision/Verbal cueing Max wheelchair distance: 150'    Wheelchair 50 feet with 2 turns activity    Assist    Wheelchair 50 feet with 2 turns activity did not occur: Safety/medical concerns   Assist Level: Supervision/Verbal cueing   Wheelchair 150 feet activity     Assist  Wheelchair 150 feet activity did not occur: Safety/medical concerns   Assist Level: Supervision/Verbal cueing   Blood pressure 101/82, pulse 86, temperature 98.2 F (36.8 C), temperature source Oral, resp. rate 18, height 6' (1.829 m), weight 79 kg, SpO2 95 %.    Medical Problem List and Plan: 1.  Debility secondary to sepsis/multi medical with history of traumatic subdural hematoma, traumatic intraparenchymal hemorrhage/occipital fracture, aspiration pneumonia, ischemic CVA  Continue CIR 2.  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 Given history of traumatic subdural hematoma and intraparenchymal hemorrhage will continue low dose aspirin and plan for repeat Doppler to assess propogation.  -Continue Lovenox             -antiplatelet therapy: Aspirin 81 mg daily 3. Pain: continue Voltaren gel, Lidoderm patch as directed, decrease oxycodone to 5mg  q6H as needed 4. Mood: Provide emotional support             -antipsychotic agents: N/A 5. Neuropsych: This patient is?  Fully capable of making decisions on his own behalf. 6. Left buttock pressure injury: Routine skin checks, continue wound care.  7. Fluids/Electrolytes/Nutrition: Routine in and outs  8.  Dysphagia/gastrostomy tube.   Gastrostomy tube placed 10/27 at [facility] and surgery tried to remove without success, discussed removal outpatient Started on regular thins on 12/2 Jevity changed to as needed 9.  AKI.  Resolved 10.  Acute urinary retention/Pseudomonas Klebsiella UTI.  d/ced foley. I&O ordered PRN.  Completed course of IV Zosyn.    Decrease urecholine to 10mg  TID  Improving 11.  Hyperlipidemia.  Zocor 12. Thrush- Diflucan 200 mg x1 and 100 mg daily x 6 days and nystatin DC'd 13. Diarrhea- resolved, continue to monitor.  15. Dizziness with therapy: orthostatic vitals ordered, TEDs. Improved with epley maneuver. Decrease meclizine to BID  16. Decreased appetite: decrease feeding supplement to BID. D/c Jevity 17.  Hyponatremia  Sodium 134 on 11/21, labs ordered for Monday 18. Sleep disturbance  Continue melatonin  Added magnesium gluconate 250mg  HS  Ordered therapeutic grounds pass to help reset circadian rhythm  Improving 19. Tachycardia: continue to monitor HR TID  Improving 20.  Mild acute blood loss anemia  Hemoglobin 12.4 in 11/16, labs ordered for Monday 21. GERD: transitioned protonix to po   LOS: 23 days A FACE TO FACE EVALUATION WAS PERFORMED  Raymond Stuart Raymond Stuart 11/02/2021, 4:32 PM

## 2021-11-02 NOTE — Progress Notes (Signed)
Occupational Therapy Session Note  Patient Details  Name: Cedar Ditullio MRN: [id number] Date of Birth: [birth year]  Today's Date: 11/02/2021 OT Individual Time: 4503-8882 OT Individual Time Calculation (min): 60 min   Short Term Goals: Week 1:  OT Short Term Goal 1 (Week 1): Pt will completed sit > stand with Max A x1 in prep for functional ADL OT Short Term Goal 1 - Progress (Week 1): Met OT Short Term Goal 2 (Week 1): Pt will complete UBD with mod A OT Short Term Goal 2 - Progress (Week 1): Met OT Short Term Goal 3 (Week 1): Pt will complete LBD with max A OT Short Term Goal 3 - Progress (Week 1): Progressing toward goal Week 2:  OT Short Term Goal 1 (Week 2): Pt will complete LBD with max A OT Short Term Goal 1 - Progress (Week 2): Met OT Short Term Goal 2 (Week 2): Pt will complete toileitng tasks with Max A OT Short Term Goal 2 - Progress (Week 2): Met OT Short Term Goal 3 (Week 2): Pt will maintain standing for functional task for at least 3 mins to promote increased endurance OT Short Term Goal 3 - Progress (Week 2): Met Week 3:  OT Short Term Goal 1 (Week 3): STG = LTG due to ELOS  Skilled Therapeutic Interventions/Progress Updates:    Patient up in w/c finishing up PT, pt completed UB theraex using a 3lb dumb 2 sets of 15 for bicep curl, shld flexion, and horizontal abduction to maximize independence and safety during BADL performance.  The pt was encouraged to incorporate rest breaks as needed, as well as, relaxation breathing to improve compliance.  The pt went on to complete sit to stand to improve standing balance for safe and independent function with task performance.  The patient reported a pain response of 4 on a 0-10 for LLE. The pt was able to come from sit to stand with CGA and ambulate to the sink area with the RW to complete a simple grooming task in brushing his teeth and hair in standing with s/u assist..  The pt was able to return to EOB and transfer to lying in supine  with CGA for managing his legs.  The pt was comfortable in bed with the  bedside table and  call light  in place with all additional needs addressed.   Therapy Documentation Precautions:  Precautions Precautions: Fall Precaution Comments: peg tube Restrictions Weight Bearing Restrictions: No General:   Vital Signs:   Pain: Pain Assessment Pain Scale: 0-10 Pain Score: 0-No pain ADL:  PVision   Perception    Praxis   Balance Standardized Balance Assessment Standardized Balance Assessment: (P) Timed Up and Go Test Timed Up and Go Test TUG: (P) Normal TUG Normal TUG (seconds): (P) 20 Exercises:   Other Treatments:     Therapy/Group: Individual Therapy  Yvonne Kendall 11/02/2021, 12:26 PM

## 2021-11-02 NOTE — Progress Notes (Signed)
Physical Therapy Session Note  Patient Details  Name: Raymond Stuart MRN: [id number] Date of Birth: 10/18/1951  Today's Date: 11/02/2021 PT Individual Time: 0810-0903 PT Individual Time Calculation (min): 53 min   Short Term Goals: Week 3:  PT Short Term Goal 1 (Week 3): STG=LTG due to LOS  Skilled Therapeutic Interventions/Progress Updates:    Pt received supine in bed and agreeable to therapy session. Supine>sitting R EOB, HOB partially elevated and using bedrails as needed with supervision. Sitting EOB donned pants, socks using sock aide, and shoes with set-up assist then L knee brace over pants with mod assist for time management. Sit>stand EOB (lowered fully)>RW with CGA for safety and pt slowly rises to stand - standing with CGA for safety pulled pants up over hips without assist. Gait training ~16ft to elevator from room using RW with therapist providing w/c follow in event of fatigue - CGA for safety - demos reciprocal stepping pattern with good foot clearance; however, has forward flexed posture with increased WBing through AD and lacks terminal knee extension bilaterally during stance (L knee more impaired that R). Performed B UE w/c propulsion on/off elevator and ~145ft to main therapy gym with supervision.  Participated in Timed Up and Go (TUG): *using RW with close supervision* 1st trial: 28.10 seconds 2nd trial: 20.12 seconds  Patient demonstrates high fall risk as indicated by requiring >13.5seconds to complete the TUG.  Educated pt on results of the outcome measure.  Provided pt with the below HEP printout and educated on safe set-up at home.  Access Code: [email or web address] URL: [email or web address] Date: 11/02/2021 Prepared by: Casimiro Needle  Exercises Walking - 1 x daily - 7 x weekly - 3 sets - 100 reps Sit to Stand with Counter Support - 1 x daily - 7 x weekly - 2 sets - 10 reps Standing March with Counter Support - 1 x daily - 7 x weekly - 2 sets - 10 reps Mini  Squat with Counter Support - 1 x daily - 7 x weekly - 2 sets - 10 reps Side Stepping with Counter Support - 1 x daily - 7 x weekly - 2 sets - 10 reps  Transported back to room and pt left seated in w/c as hand-off to OT.   Therapy Documentation Precautions:  Precautions Precautions: Fall Precaution Comments: peg tube Restrictions Weight Bearing Restrictions: No   Pain:  Reports pain in lower back, L leg, and buttocks - unrated- premedicated and does not limit pt's participation in session with seated breaks provided for pain management.   Therapy/Group: Individual Therapy  Ginny Forth , PT, DPT, NCS, CSRS  11/02/2021, 7:41 AM

## 2021-11-02 NOTE — Discharge Summary (Signed)
Physician Discharge Summary  Patient ID: Raymond Stuart MRN: [id number] DOB/AGE: Mar 31, 1951 70 y.o.  Admit date: 10/10/2021 Discharge date: 11/04/2021  Discharge Diagnoses:  Principal Problem:   Sepsis due to undetermined organism [facility]) Active Problems:   Type 2 diabetes mellitus with hyperglycemia (HCC)   Protein-calorie malnutrition, severe   Ischemic stroke (HCC)   PEG (percutaneous endoscopic gastrostomy) status (HCC)   Sepsis (HCC)   Hyponatremia   Dyslipidemia   Oral thrush   Sleep disturbance   Acute blood loss anemia AKI-resolved Pseudomonas Klebsiella UTI GERD Left proximal mid femoral vein left proximal profunda DVT  Discharged Condition: Stable  Significant Diagnostic Studies: DG Knee 1-2 Views Left  Result Date: 10/07/2021 CLINICAL DATA:  Left knee pain EXAM: LEFT KNEE - 1-2 VIEW COMPARISON:  None. FINDINGS: No evidence of fracture, dislocation, or joint effusion. No evidence of arthropathy or other focal bone abnormality. Soft tissues are unremarkable. IMPRESSION: Negative. Electronically Signed   By: Larose Hires D.O.   On: 10/07/2021 13:11   DG ABDOMEN PEG TUBE LOCATION  Result Date: 11/01/2021 CLINICAL DATA:  Check gastrostomy tube EXAM: ABDOMEN - 1 VIEW COMPARISON:  None. FINDINGS: Contrast injected through the gastrostomy tube is seen in the lumen of stomach. There is no demonstrable extravasation of contrast. Bowel gas pattern is nonspecific. IMPRESSION: Tip of gastrostomy tube appears to be in the lumen of stomach. Electronically Signed   By: Ernie Avena M.D.   On: 11/01/2021 11:55   DG Swallowing Func-Speech Pathology  Result Date: 10/09/2021 Table formatting from the original result was not included. Objective Swallowing Evaluation: Type of Study: MBS-Modified Barium Swallow Study  Patient Details Name: Raymond Stuart MRN: [id number] Date of Birth: 09/09/1951 Today's Date: 10/09/2021 Time: SLP Start Time (ACUTE ONLY): 1320 -SLP Stop Time (ACUTE ONLY):  1345 SLP Time Calculation (min) (ACUTE ONLY): 25 min Past Medical History: Past Medical History: Diagnosis Date  GERD (gastroesophageal reflux disease)   Hyperlipidemia   Joint pain   Kidney stone  Past Surgical History: Past Surgical History: Procedure Laterality Date  LITHOTRIPSY   HPI: Raymond Stuart is a 70 y.o. male with medical history significant for GERD hyperlipidemia, osteoarthritis, urolithiasis who was recently admitted to [facility] (9/26-29) after falling, hitting his head on concrete and sustaining a large right frontal intraparenchymal hemorrhage, large intraventricular hemorrhage, SDH and right occipital bone fx. PEG 10/27.  D/Cd home, 9/30 admitted to [facility] then D/Cd  to SNF for rehab on 11/1.  Pt was transferred from his facility after being there for less than 24 hours to [facility] after being found covered in vomit, febrile at 101.2 F,, tachypneic/dyspneic and hypoxic in the 80s.  Per Md notes, "he responded to [facility] oxygen at 10 LPM then subsequently brought to the emergency department.  At the facility, he was started getting tube feeding around 2300 last night.  He has been bedbound.  A significant portion of the history is taken from his wife, but he was able to say a few things and answer simple questions."  Swallow eval ordered.  Pt has hx of dysphagia diagnosed initially 10/4 and mech soft diet ordered, mentation waxed/waned and pt was made npo with Dobhoff.  MBS then conducted 10/19 and 11/1 - recommendation was npo x frazier water protocol.  Large barrier to adequate po intake has been his mentation.    Recs from 11/1 MBS were npo and ice chips with aggressive SLP at next venue of care.  11/3 clinical swallow evaluation recommended NPO except ice chips and  repeat MBS. Repeat MBS completed 11/4 with recommendation to continue NPO except for ice/water PRN and nectar thick liquid trials with SLP only.  Subjective: awake and alert, sitting in radiology suite  Recommendations for follow up therapy are one  component of a multi-disciplinary discharge planning process, led by the attending physician.  Recommendations may be updated based on patient status, additional functional criteria and insurance authorization. Assessment / Plan / Recommendation Clinical Impressions 10/09/2021 Clinical Impression Patient demonstrated a significant improvement in his swallow function during today's MBS as compared to MBS on 11/4. During today's MBS, he presents with a mild-moderate oral and a mild pharygneal phase dysphagia. During his oral phase of swallow, he exhibited anterior to posterior transit delays with all tested solid and liquid consistencies and decreased bolus cohesion with regular texture solids. During pharyngeal phase, he exhibited swallow initiation delay to vallecular sinus with nectar thick and thin liquid consistencies. He exhibited instances of penetration (PAS 2) of trace amount of thin liquid barium during the swallow, however no aspiration observed with thin liquid, nectar thick or honey thick liquids during any phase of the swallow. Heavier, more viscous boluses such as regular texture solids and puree solids resulted in min-mod vallecular sinus residuals and min pyriform sinus residuals and with thinner boluses such as thin liquid barium, only trace amount of vallecular and pyriform sinus residuals was observed. When taken with puree solids, barium tablet became briefly lodged in vallecular sinus and puree solids and thin liquids were both unsuccessful in dislodging it. Honey thick liquid sip did help to transit barium tablet out of vallecular sinus and through pharynx. Sips of thin liquid barium then did help to clear majority of vallecular and pyriform sinus residuals from honey thick liquid barium. SLP is recommending to initiate PO diet of Dys 1 solids, thin liquids and meds crushed in puree or via PEG. SLP Visit Diagnosis Dysphagia, oropharyngeal phase (R13.12) Attention and concentration deficit  following -- Frontal lobe and executive function deficit following -- Impact on safety and function Mild aspiration risk;Moderate aspiration risk   Treatment Recommendations 10/09/2021 Treatment Recommendations Therapy as outlined in treatment plan below   Prognosis 10/09/2021 Prognosis for Safe Diet Advancement Good Barriers to Reach Goals -- Barriers/Prognosis Comment -- Diet Recommendations 10/09/2021 SLP Diet Recommendations Dysphagia 1 (Puree) solids;Thin liquid Liquid Administration via Cup;Straw Medication Administration Crushed with puree Compensations Minimize environmental distractions;Follow solids with liquid;Small sips/bites;Slow rate Postural Changes Seated upright at 90 degrees   Other Recommendations 10/09/2021 Recommended Consults -- Oral Care Recommendations Oral care BID;Staff/trained caregiver to provide oral care Other Recommendations -- Follow Up Recommendations Acute inpatient rehab (3hours/day) Assistance recommended at discharge Frequent or constant Supervision/Assistance Functional Status Assessment -- Frequency and Duration  10/09/2021 Speech Therapy Frequency (ACUTE ONLY) min 2x/week Treatment Duration 1 week   Oral Phase 10/09/2021 Oral Phase Impaired Oral - Pudding Teaspoon -- Oral - Pudding Cup -- Oral - Honey Teaspoon Weak lingual manipulation;Reduced posterior propulsion;Decreased bolus cohesion;Delayed oral transit Oral - Honey Cup -- Oral - Nectar Teaspoon -- Oral - Nectar Cup Delayed oral transit;Weak lingual manipulation Oral - Nectar Straw -- Oral - Thin Teaspoon -- Oral - Thin Cup Decreased bolus cohesion;Delayed oral transit;Reduced posterior propulsion;Weak lingual manipulation Oral - Thin Straw Delayed oral transit;Decreased bolus cohesion;Weak lingual manipulation;Reduced posterior propulsion Oral - Puree Reduced posterior propulsion;Weak lingual manipulation;Delayed oral transit Oral - Mech Soft -- Oral - Regular Delayed oral transit;Impaired mastication;Weak lingual  manipulation;Reduced posterior propulsion Oral - Multi-Consistency -- Oral - Pill WFL Oral  Phase - Comment --  Pharyngeal Phase 10/09/2021 Pharyngeal Phase Impaired Pharyngeal- Pudding Teaspoon -- Pharyngeal -- Pharyngeal- Pudding Cup -- Pharyngeal -- Pharyngeal- Honey Teaspoon -- Pharyngeal -- Pharyngeal- Honey Cup Pharyngeal residue - valleculae;Pharyngeal residue - pyriform Pharyngeal -- Pharyngeal- Nectar Teaspoon -- Pharyngeal -- Pharyngeal- Nectar Cup Delayed swallow initiation-vallecula Pharyngeal Material does not enter airway Pharyngeal- Nectar Straw NT Pharyngeal -- Pharyngeal- Thin Teaspoon -- Pharyngeal -- Pharyngeal- Thin Cup Reduced airway/laryngeal closure;Delayed swallow initiation-vallecula;Pharyngeal residue - valleculae;Pharyngeal residue - pyriform;Penetration/Aspiration during swallow Pharyngeal Material enters airway, remains ABOVE vocal cords then ejected out Pharyngeal- Thin Straw Delayed swallow initiation-vallecula;Reduced airway/laryngeal closure;Penetration/Aspiration during swallow;Pharyngeal residue - pyriform;Pharyngeal residue - valleculae Pharyngeal Material enters airway, remains ABOVE vocal cords then ejected out Pharyngeal- Puree Pharyngeal residue - valleculae;Pharyngeal residue - pyriform Pharyngeal -- Pharyngeal- Mechanical Soft -- Pharyngeal -- Pharyngeal- Regular Pharyngeal residue - valleculae;Pharyngeal residue - pyriform Pharyngeal -- Pharyngeal- Multi-consistency -- Pharyngeal -- Pharyngeal- Pill Delayed swallow initiation-vallecula;Pharyngeal residue - valleculae;Reduced epiglottic inversion Pharyngeal -- Pharyngeal Comment --  Cervical Esophageal Phase  10/09/2021 Cervical Esophageal Phase WFL Pudding Teaspoon -- Pudding Cup -- Honey Teaspoon -- Honey Cup -- Nectar Teaspoon -- Nectar Cup -- Nectar Straw -- Thin Teaspoon -- Thin Cup -- Thin Straw -- Puree -- Mechanical Soft -- Regular -- Multi-consistency -- Pill -- Cervical Esophageal Comment -- Angela Nevin, MA,  CCC-SLP Speech Therapy                     DG HIP UNILAT WITH PELVIS 1V LEFT  Result Date: 10/07/2021 CLINICAL DATA:  Left hip and knee pain EXAM: DG HIP (WITH OR WITHOUT PELVIS) 1V*L* COMPARISON:  None. FINDINGS: Status post right hip arthroplasty with intact hardware. Mild left hip joint space narrowing. No evidence fracture or dislocation. IMPRESSION: No acute osseous abnormality of the left hip joint. Electronically Signed   By: Larose Hires D.O.   On: 10/07/2021 13:11   VAS [place] LOWER EXTREMITY VENOUS (DVT)  Result Date: 10/11/2021  Lower Venous DVT Study Patient Name:  JONTHAN LEITE  Date of Exam:   10/11/2021 Medical Rec #: [id number]    Accession #:    [id number] Date of Birth: 06-23-1951   Patient Gender: M Patient Age:   68 years Exam Location:  [facility] Procedure:      VAS [place] LOWER EXTREMITY VENOUS (DVT) Referring Phys: Mariam Dollar --------------------------------------------------------------------------------  Indications: Edema.  Comparison Study: No prior study Performing Technologist: Gertie Fey MHA, RDMS, RVT, RDCS  Examination Guidelines: A complete evaluation includes B-mode imaging, spectral Doppler, color Doppler, and power Doppler as needed of all accessible portions of each vessel. Bilateral testing is considered an integral part of a complete examination. Limited examinations for reoccurring indications may be performed as noted. The reflux portion of the exam is performed with the patient in reverse Trendelenburg.  +---------+---------------+---------+-----------+----------+--------------+ RIGHT    CompressibilityPhasicitySpontaneityPropertiesThrombus Aging +---------+---------------+---------+-----------+----------+--------------+ CFV      Full           Yes      Yes                                 +---------+---------------+---------+-----------+----------+--------------+ SFJ      Full                                                         +---------+---------------+---------+-----------+----------+--------------+  FV Prox  Full                                                        +---------+---------------+---------+-----------+----------+--------------+ FV Mid   Full                                                        +---------+---------------+---------+-----------+----------+--------------+ FV DistalFull                                                        +---------+---------------+---------+-----------+----------+--------------+ PFV      Full                                                        +---------+---------------+---------+-----------+----------+--------------+ POP      Full           Yes      Yes                                 +---------+---------------+---------+-----------+----------+--------------+ PTV      Full                                                        +---------+---------------+---------+-----------+----------+--------------+ PERO     Full                                                        +---------+---------------+---------+-----------+----------+--------------+   +---------+---------------+---------+-----------+----------+--------------+ LEFT     CompressibilityPhasicitySpontaneityPropertiesThrombus Aging +---------+---------------+---------+-----------+----------+--------------+ CFV      Full           Yes      Yes                                 +---------+---------------+---------+-----------+----------+--------------+ SFJ      Full                                                        +---------+---------------+---------+-----------+----------+--------------+ FV Prox  None                    No                   Acute          +---------+---------------+---------+-----------+----------+--------------+  FV Mid   None                    No                   Acute           +---------+---------------+---------+-----------+----------+--------------+ FV DistalFull                                                        +---------+---------------+---------+-----------+----------+--------------+ PFV      None                    No                   Acute          +---------+---------------+---------+-----------+----------+--------------+ POP      Full           Yes      Yes                                 +---------+---------------+---------+-----------+----------+--------------+ PTV      Full                                                        +---------+---------------+---------+-----------+----------+--------------+ PERO     Full                                                        +---------+---------------+---------+-----------+----------+--------------+     Summary: RIGHT: - There is no evidence of deep vein thrombosis in the lower extremity.  - No cystic structure found in the popliteal fossa.  LEFT: - Findings consistent with acute deep vein thrombosis involving the left proximal and mid femoral vein, and left proximal profunda vein.  *See table(s) above for measurements and observations. Electronically signed by Coral Else MD on 10/11/2021 at 8:52:26 PM.    Final     Labs:  Basic Metabolic Panel: Recent Labs  Lab 10/30/21 1224 10/31/21 0534  NA 135  --   K 3.8  --   CL 106  --   CO2 20*  --   GLUCOSE 92  --   BUN 16  --   CREATININE 0.94 0.84  CALCIUM 9.0  --     CBC: Recent Labs  Lab 10/30/21 1224  WBC 8.0  NEUTROABS 4.2  HGB 14.2  HCT 43.5  MCV 89.1  PLT 250    CBG: Recent Labs  Lab 10/27/21 2058 10/28/21 0606 10/28/21 1142 10/28/21 2056 10/29/21 0552  GLUCAP 107* 95 112* 125* 83   Family history.  Mother with hypertension.  Denies any colon cancer esophageal cancer or rectal cancer  Brief HPI:   Angela Platner is a 70 y.o. right-handed male with history of GERD, hyperlipidemia who was recently  admitted to [facility] 9/20 6-07/2028 and then to [facility] 9/30-2011/1 after mechanical fall sustaining  subdural hematoma traumatic intracranial hemorrhage ventricular hemorrhage in the setting of acute head injury with occipital fracture complicated by ischemic stroke followed by aspiration pneumonia in the setting of stroke as well as PEG tube placement 09/21/2021 while at [facility] complicated by AKI with urinary retention indwelling Foley catheter tube was placed and discharged to facility 11/1.  Per chart review prior to latest admission lives with spouse in [place].  Independent prior to admission.  Presented 09/27/2021 to [facility] from skilled facility after being found covered in vomitus as well as fever, tachypneic/dyspneic hypoxic in the 80s.  Responded to nonrebreather mask oxygen 10 L.  Chest x-ray showed low lung volumes without radiographic evidence of acute pulmonary disease.  Admission chemistry glucose 161 BUN 27 WBC 15,300 urine culture 100,000 Klebsiella as well as Pseudomonas, blood cultures no growth to date lactic acid 3.1.  Patient was placed on intravenous Zosyn suspect sepsis completing course 10/05/2021.  Hospital course bouts of diarrhea felt to be related to tube feeds.  His diet was slowly advanced.  Subcutaneous Lovenox for DVT prophylaxis.  He remained on low-dose aspirin for CVA.  Initial urinary retention Foley tube in place voiding trial Foley tube removed remained on Flomax tube had to later be reinserted due to retention.  Monitoring of LFTs initially elevated hepatitis panel negative and monitored.  Persistent left hip and left knee pain x-ray showed no acute findings.  W OC follow-up for stage II pressure injury to the sacrum generalized wound care.  Therapy evaluations completed due to patient decreased functional mobility was admitted for a comprehensive rehab program.   Hospital Course: Karin Griffith was admitted to rehab  10/10/2021 for inpatient therapies to consist of PT, ST and OT at least three hours five days a week. Past admission physiatrist, therapy team and rehab RN have worked together to provide customized collaborative inpatient rehab.  Pertain to patient's debility secondary to sepsis/multi medical with history of traumatic subdural hematoma, traumatic intraparenchymal hemorrhage/occipital fracture complicated by aspiration pneumonia ischemic CVA.  Patient remained on low-dose aspirin therapy.  Subcutaneous Lovenox for DVT prophylaxis.  Findings of left proximal mid femoral vein left proximal profunda vein DVT isolated as per discussion with vascular surgery given history of traumatic subdural hematoma and intraparenchymal hemorrhage advised to continue low-dose aspirin.  Patient did have a gastrostomy tube that was placed 10/27 while at [facility].  His diet is since been advanced to regular consistency General surgery consulted for removal of PEG tube that was unsuccessful KUB showed tube in good position however due to being unable to remove the tube it was highly recommended the patient follow back up at [facility] hospital for removal of tube and this was discussed at length with family.  During his hospital rehab course acute urinary retention Pseudomonas Klebsiella UTI completing course of antibiotic he was weaned from Urecholine and could resume his home Flomax.  Zocor ongoing for hyperlipidemia.  Bouts of thrush responding to Diflucan.  GERD maintained on Protonix.   Blood pressures were monitored on TID basis and controlled     Rehab course: During patient's stay in rehab weekly team conferences were held to monitor patient's progress, set goals and discuss barriers to discharge. At admission, patient required plus to mod assist General transfers max assist sit to side-lying moderate assist side-lying to sitting  Physical exam.  Blood pressure 116/80 pulse 96 temperature 98  respirations 18 oxygen saturation 94% room air Constitutional.  No acute  distress HEENT Head.  Normocephalic and atraumatic Eyes.  Pupils round and reactive to light no discharge without nystagmus Neck.  Supple nontender no JVD without thyromegaly Cardiac regular rate rhythm any extra sounds or murmur heard Abdomen.  Soft nontender positive bowel sounds without rebound gastrostomy tube in place Skin.  MASD two small spots increase of buttocks Neurologic.  Alert and interactive makes eye contact with examiner follows commands. Musculoskeletal.  No rigidity Comments.  Right upper extremity 4+/5 deltoids 5 -/5 otherwise Left upper extremity deltoids 4/5 otherwise 4+/5 Right lower extremity 5 -/5 Left lower extremity hip flexors 2 -/5 knee extension 2/5 dorsiflexion 4 -/5 plantarflexion 4 -/5    He/She  has had improvement in activity tolerance, balance, postural control as well as ability to compensate for deficits. He/She has had improvement in functional use RUE/LUE  and RLE/LLE as well as improvement in awareness.  Supine to sit with supervision with head of bed elevated.  Sit to stand transfers rolling walker supervision.  Manual wheelchair propulsion supervision.  Ambulates 140 feet rolling walker contact-guard assist.  Bed mobility modified independent, he could place his shoes with set up assist.  Sit to stand for amatory transfers.  Speech therapy follow-up discussing advancement in diet.  Full family teaching completed and discharged to home       Disposition: Discharged to home   Diet: Regular  Special Instructions: No driving smoking or alcohol  Patient will need follow-up at [facility] hospital for removal of gastrostomy tube that was placed 09/21/2021  Medications at discharge 1.  Tylenol as needed 2.  Aspirin 81 mg p.o. daily 3.  Urecholine 10 mg p.o. 3 times daily x1 week 4.  Voltaren gel 2 g 4 times daily to affected area 5.  Lidoderm patch change as  directed 6.  Magnesium gluconate 250 mg p.o. nightly 7.  Antivert 12.5 mg p.o. 3 times daily 8.  Melatonin 3 mg p.o. nightly 9.  Oxycodone 5 mg every 6 hours as needed pain 10.  Protonix 40 mg p.o. daily 11.  Florastor 250 mg p.o. twice daily 12.  Zocor 40 mg p.o. daily 13.  Flomax 0.4 mg daily 14.  Antivert 12.5 mg twice daily  30-35 minutes spent completing discharge summary and discharge planning  Discharge Instructions     Ambulatory referral to Physical Medicine Rehab   Complete by: As directed    Moderate complexity follow-up 2 weeks as directed debility/sepsis        Follow-up Information     Raulkar, Drema Pry, MD Follow up.   Specialty: Physical Medicine and Rehabilitation Why: 01/23/22 please arrive at 10:40am for 11am appointment, thank you! Contact information: [address] [place] [address] [phone]                 Signed: Mcarthur Rossetti Milli Woolridge 11/03/2021, 4:55 AM

## 2021-11-03 ENCOUNTER — Other Ambulatory Visit (HOSPITAL_COMMUNITY): Payer: Self-pay

## 2021-11-03 LAB — GLUCOSE, CAPILLARY
Glucose-Capillary: 121 mg/dL — ABNORMAL HIGH (ref 70–99)
Glucose-Capillary: 130 mg/dL — ABNORMAL HIGH (ref 70–99)
Glucose-Capillary: 138 mg/dL — ABNORMAL HIGH (ref 70–99)

## 2021-11-03 MED ORDER — SORBITOL 70 % SOLN
30.0000 mL | Freq: Every day | Status: DC | PRN
  Administered 2021-11-03: 30 mL via ORAL
  Filled 2021-11-03 (×2): qty 30

## 2021-11-03 MED ORDER — MAGNESIUM HYDROXIDE 400 MG/5ML PO SUSP
5.0000 mL | Freq: Every day | ORAL | Status: DC | PRN
  Administered 2021-11-03: 5 mL via ORAL
  Filled 2021-11-03: qty 30

## 2021-11-03 MED ORDER — MECLIZINE HCL 12.5 MG PO TABS
12.5000 mg | ORAL_TABLET | Freq: Two times a day (BID) | ORAL | 0 refills | Status: DC
  Filled 2021-11-03: qty 60, 30d supply, fill #0

## 2021-11-03 NOTE — Progress Notes (Signed)
Occupational Therapy Session Note  Patient Details  Name: Raymond Stuart MRN: [id number] Date of Birth: August 06, 1951  Today's Date: 11/03/2021 OT Individual Time: 3794-3276 OT Individual Time Calculation (min): 57 min    Short Term Goals: Week 3:  OT Short Term Goal 1 (Week 3): STG = LTG due to ELOS  Skilled Therapeutic Interventions/Progress Updates:  Skilled OT intervention completed with focus on d/c planning, discussion of rehab goals, and POC, BUE endurance, w/c assembly for independence with functional mobility. Pt received seated EOB, agreeable to session. Pt donned L sock and both shoes with set up A, then sit > stand at EOB using RW with distant supervision for pt to donn plants over hips. Requested to set up new w/c in room to prepare for home, with therapist removing plastic wraps, and educating pt on set up methods. Ambulatory transfer to w/c using RW and supervision. Educated pt on how to adjust leg rests. Pt self-propelled in w/c, per request to test out new chair, with supervision, for increased BUE endurance, to [employer]. Completed seated BUE and core strengthening exercises with 3 pound dowel to promote increased strength needed for functional transfers including the following: Bicep flexion 2x20, alternating russian twists x25. Cues needed for form and technique. During rest break, education provided about community reintegration with pt expressing he feels a little overwhelmed in loud environments, with therapist educating and reminding pt about the process of brain healing and how concentration may still be a challenge, with practical ways to modify/adjust to environmental demands once d/c. Pt self-propelled back in w/c to room, with supervision, with pt left seated in w/c, chair alarm on and all needs in reach at end of session.  Therapy Documentation Precautions:  Precautions Precautions: Fall Precaution Comments: peg tube Restrictions Weight Bearing Restrictions:  No  Pain: No c/o pain   Therapy/Group: Individual Therapy  Raymond Stuart E [name] 11/03/2021, 7:32 AM

## 2021-11-03 NOTE — Progress Notes (Signed)
Inpatient Rehabilitation Discharge Medication Review by a Pharmacist  A complete drug regimen review was completed for this patient to identify any potential clinically significant medication issues.  High Risk Drug Classes Is patient taking? Indication by Medication  Antipsychotic No   Anticoagulant No   Antibiotic No   Opioid Yes Oxycodone- pain (hip, knee, pressure ulcers)   Antiplatelet Yes ASA s/p CVA  Hypoglycemics/insulin No   Vasoactive Medication No   Chemotherapy No   Other No Bethanechol- urinary retention Voltaren gel- hip/knee pain Lidoderm patch- hip/knee pain Melatonin- sleep Meclizine- insomnia Multivitamins- supplement Pantoprazole- GERD Simvastatin- HLD Flomax- BPH Vitamin D- deficiency     Type of Medication Issue Identified Description of Issue Recommendation(s)  Drug Interaction(s) (clinically significant)     Duplicate Therapy     Allergy     No Medication Administration End Date     Incorrect Dose     Additional Drug Therapy Needed     Significant med changes from prior encounter (inform family/care partners about these prior to discharge).    Other       Clinically significant medication issues were identified that warrant physician communication and completion of prescribed/recommended actions by midnight of the next day:  No   Time spent performing this drug regimen review (minutes):    Sigrid Schwebach BS, PharmD, BCPS Clinical Pharmacist 11/03/2021 9:57 AM

## 2021-11-03 NOTE — Plan of Care (Signed)
  Problem: RH Balance Goal: LTG Patient will maintain dynamic sitting balance (PT) Description: LTG:  Patient will maintain dynamic sitting balance with assistance during mobility activities (PT) Outcome: Completed/Met Goal: LTG Patient will maintain dynamic standing balance (PT) Description: LTG:  Patient will maintain dynamic standing balance with assistance during mobility activities (PT) Outcome: Completed/Met   Problem: RH Bed Mobility Goal: LTG Patient will perform bed mobility with assist (PT) Description: LTG: Patient will perform bed mobility with assistance, with/without cues (PT). Outcome: Completed/Met   Problem: RH Bed to Chair Transfers Goal: LTG Patient will perform bed/chair transfers w/assist (PT) Description: LTG: Patient will perform bed to chair transfers with assistance (PT). Outcome: Completed/Met   Problem: RH Car Transfers Goal: LTG Patient will perform car transfers with assist (PT) Description: LTG: Patient will perform car transfers with assistance (PT). Outcome: Completed/Met   Problem: RH Ambulation Goal: LTG Patient will ambulate in controlled environment (PT) Description: LTG: Patient will ambulate in a controlled environment, # of feet with assistance (PT). Outcome: Completed/Met   Problem: RH Wheelchair Mobility Goal: LTG Patient will propel w/c in controlled environment (PT) Description: LTG: Patient will propel wheelchair in controlled environment, # of feet with assist (PT) Outcome: Completed/Met Goal: LTG Patient will propel w/c in home environment (PT) Description: LTG: Patient will propel wheelchair in home environment, # of feet with assistance (PT). Outcome: Completed/Met

## 2021-11-03 NOTE — Progress Notes (Signed)
Occupational Therapy Discharge Summary  Patient Details  Name: Raymond Stuart MRN: [id number] Date of Birth: Sep 05, 1951   Patient has met 9 of 9 long term goals due to improved activity tolerance, improved balance, postural control, ability to compensate for deficits, improved attention, improved awareness, and improved coordination.  Patient to discharge at overall Supervision level.  Patient's care partner is independent to provide the necessary supervision and occasional min assistance with higher level tasks at discharge.    Reasons goals not met: n/a  Recommendation:  Patient will benefit from ongoing skilled OT services in home health setting to continue to advance functional skills in the area of BADL, iADL, and Reduce care partner burden.  Equipment: BSC and TTB  Reasons for discharge: treatment goals met  Patient/family agrees with progress made and goals achieved: Yes  OT Discharge Precautions/Restrictions  Precautions Precautions: Fall Precaution Comments: peg tube- (awaiting removal after d/c due to difficulty upon first attempt per MD report) Required Braces or Orthoses: Other Brace Other Brace: L knee brace Restrictions Weight Bearing Restrictions: No ADL ADL Eating: Modified independent Where Assessed-Eating: Wheelchair Grooming: Modified independent Where Assessed-Grooming: Sitting at sink Upper Body Bathing: Setup Where Assessed-Upper Body Bathing: Shower Lower Body Bathing: Setup Where Assessed-Lower Body Bathing: Shower Upper Body Dressing: Modified independent (Device) Where Assessed-Upper Body Dressing: Edge of bed Lower Body Dressing: Supervision/safety Where Assessed-Lower Body Dressing: Edge of bed Toileting: Supervision/safety Where Assessed-Toileting: Bedside Commode Toilet Transfer: Close supervision (RW) Toilet Transfer Method: Counselling psychologist: Radiographer, therapeutic: Close supervison Clinical cytogeneticist  Method: Educational psychologist: Facilities manager: Close supervision Social research officer, government Method: Management consultant: Radio broadcast assistant, Grab bars Vision Baseline Vision/History: 1 Wears glasses (reading) Patient Visual Report: No change from baseline (reports improvement in blurriness) Vision Assessment?: Yes Ocular Range of Motion: Within Functional Limits Tracking/Visual Pursuits: Able to track stimulus in all quads without difficulty Saccades: Within functional limits Visual Fields: No apparent deficits Perception  Perception: Within Functional Limits Praxis Praxis: Intact Cognition Overall Cognitive Status: Within Functional Limits for tasks assessed Arousal/Alertness: Awake/alert Orientation Level: Oriented X4 Year: 2022 Month: December Day of Week: Correct Immediate Memory Recall: Sock;Blue;Bed Memory Recall Sock: Without Cue Memory Recall Blue: Without Cue Memory Recall Bed: Without Cue Awareness: Appears intact Problem Solving: Appears intact Safety/Judgment: Appears intact Sensation Sensation Light Touch: Appears Intact Hot/Cold: Appears Intact Proprioception: Appears Intact Coordination Gross Motor Movements are Fluid and Coordinated: Yes Fine Motor Movements are Fluid and Coordinated: No Coordination and Movement Description: generalized weakness/deconditoning and decreased balance/postural control Finger Nose Finger Test: Has improved since eval, WFL bilaterally Motor  Motor Motor: Within Functional Limits Motor - Skilled Clinical Observations: generalized weakness/deconditoning and decreased balance/postural control Mobility  Bed Mobility Bed Mobility: Rolling Right;Rolling Left;Sit to Supine;Supine to Sit Rolling Right: Independent with assistive device Rolling Left: Independent with assistive device Supine to Sit: Independent with assistive device Sit to Supine: Independent with assistive  device Transfers Sit to Stand: Independent with assistive device Stand to Sit: Independent with assistive device  Trunk/Postural Assessment  Cervical Assessment Cervical Assessment: Exceptions to [facility] (forward head) Thoracic Assessment Thoracic Assessment: Exceptions to [facility] (kyphosis) Lumbar Assessment Lumbar Assessment: Exceptions to [facility] (posterior pelvic tilt) Postural Control Postural Control: Within Functional Limits  Balance Balance Balance Assessed: Yes Standardized Balance Assessment Standardized Balance Assessment: Berg Balance Test Berg Balance Test Sit to Stand: Able to stand  independently using hands Standing Unsupported: Able to stand safely 2 minutes Sitting  with Back Unsupported but Feet Supported on Floor or Stool: Able to sit safely and securely 2 minutes Stand to Sit: Controls descent by using hands Transfers: Able to transfer safely, minor use of hands Standing Unsupported with Eyes Closed: Unable to keep eyes closed 3 seconds but stays steady Standing Ubsupported with Feet Together: Able to place feet together independently but unable to hold for 30 seconds From Standing, Reach Forward with Outstretched Arm: Can reach confidently >25 cm (10") From Standing Position, Pick up Object from Floor: Unable to try/needs assist to keep balance From Standing Position, Turn to Look Behind Over each Shoulder: Looks behind one side only/other side shows less weight shift Turn 360 Degrees: Able to turn 360 degrees safely but slowly Standing Unsupported, Alternately Place Feet on Step/Stool: Able to complete >2 steps/needs minimal assist Standing Unsupported, One Foot in Front: Able to plae foot ahead of the other independently and hold 30 seconds Standing on One Leg: Tries to lift leg/unable to hold 3 seconds but remains standing independently Total Score: 35 Static Sitting Balance Static Sitting - Balance Support: Feet supported;Bilateral upper extremity supported Static  Sitting - Level of Assistance: 7: Independent Dynamic Sitting Balance Dynamic Sitting - Balance Support: Feet supported;No upper extremity supported Dynamic Sitting - Level of Assistance: 6: Modified independent (Device/Increase time) Static Standing Balance Static Standing - Balance Support: Bilateral upper extremity supported Static Standing - Level of Assistance: 6: Modified independent (Device/Increase time) Dynamic Standing Balance Dynamic Standing - Balance Support: Bilateral upper extremity supported Dynamic Standing - Level of Assistance: 6: Modified independent (Device/Increase time) Dynamic Standing - Comments: with transfers Extremity/Trunk Assessment RUE Assessment RUE Assessment: Within Functional Limits General Strength Comments: Grossly 4+/5 LUE Assessment LUE Assessment: Within Functional Limits General Strength Comments: Grossly 4+/5   Ednamae Schiano E Marcee Jacobs 11/03/2021, 7:38 AM

## 2021-11-03 NOTE — Progress Notes (Signed)
Speech Language Pathology Discharge Summary  Patient Details  Name: Takeem Krotzer MRN: [id number] Date of Birth: 1951/05/06  Today's Date: 11/03/2021 SLP Individual Time: 1315-1345 SLP Individual Time Calculation (min): 30 min   Skilled Therapeutic Interventions:  Patient seen with spouse and daughter both present for education for discharge planned tomorrow. SLP stressed importance of patient easing back into activities and leaving extra time to perform ADL's as well as taking adequate rest breaks when working on more cognitively demanding tasks. Patient's cognition has improved significantly, however it has been over two months since he has been home and likely he will need to re acclimate himself to his daily routines and tasks. Patient, spouse, daughter all in agreement and understanding and did not have further questions.     Patient has met 6 of 6 long term goals.  Patient to discharge at overall Modified Independent level.  Reasons goals not met: N/A   Clinical Impression/Discharge Summary: Patient made excellent progress and met all LTG's focused on swallow, voice and cognitive function. At time of discharge, patient is tolerating regular solids, thin liquids with intermittent supervision. He is modified independent for complex level cognitive functioning, however SLP has recommended to patient, wife, daughter, to ease back into daily routines and to allow for more frequent rest breaks due to fatigue from cognitive and physical tasks. SLP is not recommending f/u therapy at next venue of care secondary to patient being at modified independent level and having adequate family support.  Care Partner:  Caregiver Able to Provide Assistance: Yes  Type of Caregiver Assistance: Physical;Cognitive  Recommendation:  None      Equipment: none for ST   Reasons for discharge: Discharged from hospital;Treatment goals met   Patient/Family Agrees with Progress Made and Goals Achieved: Yes     Sonia Baller, MA, CCC-SLP Speech Therapy

## 2021-11-03 NOTE — Progress Notes (Signed)
Physical Therapy Session Note  Patient Details  Name: Raymond Stuart MRN: [id number] Date of Birth: [birth year]  Today's Date: 11/03/2021 PT Individual Time: 1105-1200 PT Individual Time Calculation (min): 55 min   Short Term Goals: Week 1:  PT Short Term Goal 1 (Week 1): Pt will tolerate sitting in WC >2 hours between therapies PT Short Term Goal 1 - Progress (Week 1): Met PT Short Term Goal 2 (Week 1): Pt will perform bed mobility with mod assist consistently PT Short Term Goal 2 - Progress (Week 1): Met PT Short Term Goal 3 (Week 1): Pt will transfer to and from [facility] with max assist of 1. PT Short Term Goal 3 - Progress (Week 1): Met PT Short Term Goal 4 (Week 1): Pt will tolerate standing in stedy x 2 minutes with mod assist PT Short Term Goal 4 - Progress (Week 1): Met Week 2:  PT Short Term Goal 1 (Week 2): pt will transfer sit<>stand with LRAD and min A PT Short Term Goal 1 - Progress (Week 2): Met PT Short Term Goal 2 (Week 2): pt will ambulate 35f with LRAD and mod A of 1 PT Short Term Goal 2 - Progress (Week 2): Met PT Short Term Goal 3 (Week 2): pt will perform simulated car transfer with LRAD and max A of 1 PT Short Term Goal 3 - Progress (Week 2): Met Week 3:  PT Short Term Goal 1 (Week 3): STG=LTG due to LOS  Skilled Therapeutic Interventions/Progress Updates:    Wc propulsion mod I x 1512fincluding 2 turns performed initially and repeated at end of session.  Car transfer - performs w/supervision w/RW  Gait 3093fn uneven surface/ramp using RW w/cga inccluding turning on platform between ascend/decent. See below for gait deviations, no balance loss or instability w/activity.  Stairs: Ascends/descends 4 stairs w/2 rails w/close supervision, recalls sequencing without cueing.     Gait training using RW x [id number] I including 2 turns,  reciprocal stepping pattern with good foot clearance; however, has forward flexed posture with increased WBing through AD and lacks  terminal knee extension bilaterally during stance (L knee more impaired that R).   Picking up object off of fllor using reacher w/supervision  Pt instructed w/wc breakdown, folding, and simulated loading into trunk.    Discussed HEP, pt stated he felt comfortable w/all exercises.  Pt left oob in wc w/alarm belt set and needs in reach    Therapy Documentation Precautions:  Precautions Precautions: Fall Precaution Comments: peg tube- (awaiting removal after d/c due to difficulty upon first attempt per MD report) Required Braces or Orthoses: Other Brace Other Brace: L knee brace Restrictions Weight Bearing Restrictions: No    Therapy/Group: Individual Therapy BarbCallie Fielding  [place]9/2022, 12:13 PM

## 2021-11-03 NOTE — Progress Notes (Signed)
PROGRESS NOTE   Subjective/Complaints: No new complaints this morning Discussed wean for meclizine at home Discussed outpatient follow-up for PEG removal He is tearful and emotional since he is so excited to go home.  ROS: Denies CP, SOB, N/V/D, dizziness improved about 85% better  Objective:   DG ABDOMEN PEG TUBE LOCATION  Result Date: 11/01/2021 CLINICAL DATA:  Check gastrostomy tube EXAM: ABDOMEN - 1 VIEW COMPARISON:  None. FINDINGS: Contrast injected through the gastrostomy tube is seen in the lumen of stomach. There is no demonstrable extravasation of contrast. Bowel gas pattern is nonspecific. IMPRESSION: Tip of gastrostomy tube appears to be in the lumen of stomach. Electronically Signed   By: Ernie Avena M.D.   On: 11/01/2021 11:55   No results for input(s): WBC, HGB, HCT, PLT in the last 72 hours.   No results for input(s): NA, K, CL, CO2, GLUCOSE, BUN, CREATININE, CALCIUM in the last 72 hours.     Intake/Output Summary (Last 24 hours) at 11/03/2021 1036 Last data filed at 11/03/2021 0800 Gross per 24 hour  Intake 357 ml  Output 700 ml  Net -343 ml         Physical Exam: Vital Signs Blood pressure 108/75, pulse 76, temperature 98.2 F (36.8 C), temperature source Oral, resp. rate 18, height 6' (1.829 m), weight 79 kg, SpO2 96 %. Gen: no distress, normal appearing HEENT: oral mucosa pink and moist, NCAT Cardio: Reg rate Chest: normal effort, normal rate of breathing GI: Non-distended.  BS +.  + PEG. Skin: Warm and dry.  Stage 2 to coccyx, PEG C/D/I Psych: tearful and emotional as he is so happy to be going home tomorrow Musc: No edema in extremities.  No tenderness in extremities. Neuro: Alert, great motivation Motor:  RUE 4+/5 deltoid- 5-/5 otherwise, unchanged LUE- deltoid 4/5; otherwise 4+/5, stable RLE- 5-/5 LLE- HF 1+-2-/5; KE 2/5, DF 4-/5   Assessment/Plan: 1. Functional deficits which  require 3+ hours per day of interdisciplinary therapy in a comprehensive inpatient rehab setting. Physiatrist is providing close team supervision and 24 hour management of active medical problems listed below. Physiatrist and rehab team continue to assess barriers to discharge/monitor patient progress toward functional and medical goals  Care Tool:  Bathing    Body parts bathed by patient: Right arm, Left arm, Chest, Abdomen, Right upper leg, Left upper leg, Face, Right lower leg, Left lower leg   Body parts bathed by helper: Buttocks, Right lower leg, Left lower leg     Bathing assist Assist Level: Set up assist     Upper Body Dressing/Undressing Upper body dressing   What is the patient wearing?: Pull over shirt    Upper body assist Assist Level: Independent with assistive device    Lower Body Dressing/Undressing Lower body dressing      What is the patient wearing?: Pants     Lower body assist Assist for lower body dressing: Supervision/Verbal cueing     Toileting Toileting    Toileting assist Assist for toileting: Contact Guard/Touching assist     Transfers Chair/bed transfer  Transfers assist  Chair/bed transfer activity did not occur: Safety/medical concerns  Chair/bed transfer assist level: Contact Guard/Touching  assist Chair/bed transfer assistive device: Geologist, engineering   Ambulation assist   Ambulation activity did not occur: Safety/medical concerns  Assist level: Contact Guard/Touching assist Assistive device: Walker-rolling Max distance: 119ft   Walk 10 feet activity   Assist  Walk 10 feet activity did not occur: Safety/medical concerns  Assist level: Contact Guard/Touching assist Assistive device: Walker-rolling   Walk 50 feet activity   Assist Walk 50 feet with 2 turns activity did not occur: Safety/medical concerns  Assist level: Contact Guard/Touching assist Assistive device: Walker-rolling    Walk 150 feet  activity   Assist Walk 150 feet activity did not occur: Safety/medical concerns         Walk 10 feet on uneven surface  activity   Assist Walk 10 feet on uneven surfaces activity did not occur: Safety/medical concerns   Assist level: Minimal Assistance - Patient > 75% Assistive device: Walker-rolling   Wheelchair     Assist Is the patient using a wheelchair?: Yes Type of Wheelchair: Manual Wheelchair activity did not occur: Safety/medical concerns  Wheelchair assist level: Supervision/Verbal cueing Max wheelchair distance: 150'    Wheelchair 50 feet with 2 turns activity    Assist    Wheelchair 50 feet with 2 turns activity did not occur: Safety/medical concerns   Assist Level: Supervision/Verbal cueing   Wheelchair 150 feet activity     Assist  Wheelchair 150 feet activity did not occur: Safety/medical concerns   Assist Level: Supervision/Verbal cueing   Blood pressure 108/75, pulse 76, temperature 98.2 F (36.8 C), temperature source Oral, resp. rate 18, height 6' (1.829 m), weight 79 kg, SpO2 96 %.    Medical Problem List and Plan: 1.  Debility secondary to sepsis/multi medical with history of traumatic subdural hematoma, traumatic intraparenchymal hemorrhage/occipital fracture, aspiration pneumonia, ischemic CVA  Continue CIR, d/c tomorrow 2.  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 Given history of traumatic subdural hematoma and intraparenchymal hemorrhage will continue low dose aspirin and plan for repeat Doppler to assess propogation.  -Continue Lovenox, d/c upon discharge home             -antiplatelet therapy: Aspirin 81 mg daily 3. Pain: continue Voltaren gel, Lidoderm patch as directed, decrease oxycodone to 5mg  q6H as needed 4. Mood: Provide emotional support             -antipsychotic agents: N/A 5. Neuropsych: This patient is?  Fully capable of making decisions on  his own behalf. 6. Left buttock pressure injury: Routine skin checks, continue wound care.  7. Fluids/Electrolytes/Nutrition: Routine in and outs  8. Dysphagia/gastrostomy tube.   Gastrostomy tube placed 10/27 at [facility] and surgery tried to remove without success, discussed removal outpatient Started on regular thins on 12/2 Jevity changed to as needed 9.  AKI.  Resolved 10.  Acute urinary retention/Pseudomonas Klebsiella UTI.  d/ced foley. I&O ordered PRN.  Completed course of IV Zosyn.    Decrease urecholine to 10mg  TID  Improving 11.  Hyperlipidemia.  Zocor 12. Thrush- Diflucan 200 mg x1 and 100 mg daily x 6 days and nystatin DC'd 13. Diarrhea- resolved, continue to monitor.  15. Dizziness with therapy: orthostatic vitals ordered, TEDs. Improved with epley maneuver. Decrease meclizine to BID, discussed plan for wean at home.  16. Decreased appetite: decrease feeding supplement to BID. D/c Jevity 17.  Hyponatremia  Sodium 134 on 11/21, continue to monitor outpatient 18. Sleep disturbance  Continue melatonin  Added magnesium gluconate 250mg  HS  Ordered therapeutic grounds pass to help reset circadian rhythm  Improving 19. Tachycardia: continue to monitor HR TID  Improving 20.  Mild acute blood loss anemia  Hemoglobin 12.4 in 11/16, continue to monitor outpatient 21. GERD: transitioned protonix to po   LOS: 24 days A FACE TO FACE EVALUATION WAS PERFORMED  12/16 Rulon Abdalla 11/03/2021, 10:36 AM

## 2021-11-03 NOTE — Progress Notes (Signed)
Physical Therapy Session Note  Patient Details  Name: Raymond Stuart MRN: [id number] Date of Birth: 1951-02-05  Today's Date: 11/03/2021 PT Individual Time: 1445-1525 PT Individual Time Calculation (min): 40 min   Short Term Goals: Week 3:  PT Short Term Goal 1 (Week 3): STG=LTG due to LOS Week 4:     Skilled Therapeutic Interventions/Progress Updates:    Patient received walking out of bathroom with RW and wife present. Patient without room independence and wife not cleared to assist with transfers to this Pts knowledge. Patient returning to wc. He reports 8/10 pain in his bottom- RN notified and provided pain rx. PT providing rest breaks, distractions and repositioning to assist with pain management. PT transporting patient in wc to therapy gym for time management and energy conservation. He completed the berg balance scale scoring a 35/56 indicating increased risk for falling and benefit from using AD. Discussed findings with patient and he verbalized understanding. Patient ambulating ~144ft partway back to his room with RW. Returning to bed. Bed alarm on, call light within reach.   Therapy Documentation Precautions:  Precautions Precautions: Fall Precaution Comments: peg tube- (awaiting removal after d/c due to difficulty upon first attempt per MD report) Required Braces or Orthoses: Other Brace Other Brace: L knee brace Restrictions Weight Bearing Restrictions: No    Therapy/Group: Individual Therapy  Elizebeth Koller, PT, DPT, CBIS  11/03/2021, 7:37 AM

## 2021-11-03 NOTE — Progress Notes (Signed)
Inpatient Rehabilitation Care Coordinator Discharge Note   Patient Details  Name: Raymond Stuart MRN: [id number] Date of Birth: 1951-09-07   Discharge location: Home  Length of Stay: 25 Days  Discharge activity level: Sup/Min  Home/community participation: spouse  Patient response EL:TRVUYE Literacy - How often do you need to have someone help you when you read instructions, pamphlets, or other written material from your doctor or pharmacy?: Often  Patient response BX:IDHWYS Isolation - How often do you feel lonely or isolated from those around you?: Never  Services provided included: MD, RD, PT, OT, SLP, RN, CM, TR, Pharmacy, SW  Financial Services:  Field seismologist Utilized: Production designer, theatre/television/film  Choices offered to/list presented to: spouse  Follow-up services arranged:  Home Health [facility] Health Agency: [facility]         Patient response to transportation need: Is the patient able to respond to transportation needs?: Yes   In the past 12 months, has lack of transportation kept you from meetings, work, or from getting things needed for daily living?: No    Comments (or additional information):  Patient/Family verbalized understanding of follow-up arrangements:  Yes  Individual responsible for coordination of the follow-up plan: [name]  Confirmed correct DME delivered: Andria Rhein 11/03/2021    Andria Rhein

## 2021-11-04 NOTE — Progress Notes (Signed)
Patient discharged today in care of family. All discharge instructions previously given as confirmed by family.  Left facility with patient ordered equipments. Appears stable at time of discharge.

## 2021-11-04 NOTE — Progress Notes (Signed)
PROGRESS NOTE   Subjective/Complaints:  Pooped around 3:30am- Ready for d/c today  ROS:  Pt denies SOB, abd pain, CP, N/V/C/D, and vision changes   Objective:   No results found. No results for input(s): WBC, HGB, HCT, PLT in the last 72 hours.   No results for input(s): NA, K, CL, CO2, GLUCOSE, BUN, CREATININE, CALCIUM in the last 72 hours.     Intake/Output Summary (Last 24 hours) at 11/04/2021 0900 Last data filed at 11/04/2021 0856 Gross per 24 hour  Intake 580 ml  Output 150 ml  Net 430 ml         Physical Exam: Vital Signs Blood pressure 114/70, pulse 87, temperature (!) 97.5 F (36.4 C), resp. rate 18, height 6' (1.829 m), weight 76.7 kg, SpO2 97 %.    General: awake, alert, appropriate, sitting up in bed; NAD HENT: conjugate gaze; oropharynx moist CV: regular rate; no JVD Pulmonary: CTA B/L; no W/R/R- good air movement GI: soft, NT, ND, (+)BS Psychiatric: appropriate- happy Neurological: Ox3  Musc: No edema in extremities.  No tenderness in extremities. Neuro: Alert, great motivation Motor:  RUE 4+/5 deltoid- 5-/5 otherwise, unchanged LUE- deltoid 4/5; otherwise 4+/5, stable RLE- 5-/5 LLE- HF 1+-2-/5; KE 2/5, DF 4-/5   Assessment/Plan: 1. Functional deficits which require 3+ hours per day of interdisciplinary therapy in a comprehensive inpatient rehab setting. Physiatrist is providing close team supervision and 24 hour management of active medical problems listed below. Physiatrist and rehab team continue to assess barriers to discharge/monitor patient progress toward functional and medical goals  Care Tool:  Bathing    Body parts bathed by patient: Right arm, Left arm, Chest, Abdomen, Right upper leg, Left upper leg, Face, Right lower leg, Left lower leg, Front perineal area, Buttocks   Body parts bathed by helper: Buttocks, Right lower leg, Left lower leg     Bathing assist Assist  Level: Set up assist     Upper Body Dressing/Undressing Upper body dressing   What is the patient wearing?: Pull over shirt    Upper body assist Assist Level: Independent with assistive device    Lower Body Dressing/Undressing Lower body dressing      What is the patient wearing?: Pants, Incontinence brief     Lower body assist Assist for lower body dressing: Supervision/Verbal cueing     Toileting Toileting    Toileting assist Assist for toileting: Supervision/Verbal cueing     Transfers Chair/bed transfer  Transfers assist  Chair/bed transfer activity did not occur: Safety/medical concerns  Chair/bed transfer assist level: Supervision/Verbal cueing Chair/bed transfer assistive device: Geologist, engineering   Ambulation assist   Ambulation activity did not occur: Safety/medical concerns  Assist level: Supervision/Verbal cueing Assistive device: Walker-rolling Max distance: 130ft   Walk 10 feet activity   Assist  Walk 10 feet activity did not occur: Safety/medical concerns  Assist level: Supervision/Verbal cueing Assistive device: Walker-rolling   Walk 50 feet activity   Assist Walk 50 feet with 2 turns activity did not occur: Safety/medical concerns  Assist level: Supervision/Verbal cueing Assistive device: Walker-rolling    Walk 150 feet activity   Assist Walk 150 feet activity did not  occur: Safety/medical concerns  Assist level: Supervision/Verbal cueing Assistive device: Walker-rolling    Walk 10 feet on uneven surface  activity   Assist Walk 10 feet on uneven surfaces activity did not occur: Safety/medical concerns   Assist level: Minimal Assistance - Patient > 75% Assistive device: Walker-rolling   Wheelchair     Assist Is the patient using a wheelchair?: Yes Type of Wheelchair: Manual Wheelchair activity did not occur: Safety/medical concerns  Wheelchair assist level: Independent Max wheelchair distance:  150'    Wheelchair 50 feet with 2 turns activity    Assist    Wheelchair 50 feet with 2 turns activity did not occur: Safety/medical concerns   Assist Level: Independent   Wheelchair 150 feet activity     Assist  Wheelchair 150 feet activity did not occur: Safety/medical concerns   Assist Level: Independent   Blood pressure 114/70, pulse 87, temperature (!) 97.5 F (36.4 C), resp. rate 18, height 6' (1.829 m), weight 76.7 kg, SpO2 97 %.    Medical Problem List and Plan: 1.  Debility secondary to sepsis/multi medical with history of traumatic subdural hematoma, traumatic intraparenchymal hemorrhage/occipital fracture, aspiration pneumonia, ischemic CVA  D/c today 2.  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 Given history of traumatic subdural hematoma and intraparenchymal hemorrhage will continue low dose aspirin and plan for repeat Doppler to assess propogation.  -Continue Lovenox, d/c upon discharge home             -antiplatelet therapy: Aspirin 81 mg daily 3. Pain: continue Voltaren gel, Lidoderm patch as directed, decrease oxycodone to 5mg  q6H as needed 4. Mood: Provide emotional support             -antipsychotic agents: N/A 5. Neuropsych: This patient is?  Fully capable of making decisions on his own behalf. 6. Left buttock pressure injury: Routine skin checks, continue wound care.  7. Fluids/Electrolytes/Nutrition: Routine in and outs  8. Dysphagia/gastrostomy tube.   Gastrostomy tube placed 10/27 at [facility] and surgery tried to remove without success, discussed removal outpatient Started on regular thins on 12/2 Jevity changed to as needed 9.  AKI.  Resolved 10.  Acute urinary retention/Pseudomonas Klebsiella UTI.  d/ced foley. I&O ordered PRN.  Completed course of IV Zosyn.    Decrease urecholine to 10mg  TID  Improving  12/10- d/c on current meds 11.   Hyperlipidemia.  Zocor 12. Thrush- Diflucan 200 mg x1 and 100 mg daily x 6 days and nystatin DC'd 13. Diarrhea- resolved, continue to monitor.  15. Dizziness with therapy: orthostatic vitals ordered, TEDs. Improved with epley maneuver. Decrease meclizine to BID, discussed plan for wean at home.  16. Decreased appetite: decrease feeding supplement to BID. D/c Jevity 17.  Hyponatremia  Sodium 134 on 11/21, continue to monitor outpatient 18. Sleep disturbance  Continue melatonin  Added magnesium gluconate 250mg  HS  Ordered therapeutic grounds pass to help reset circadian rhythm  Improving 19. Tachycardia: continue to monitor HR TID  Improving 20.  Mild acute blood loss anemia  Hemoglobin 12.4 in 11/16, continue to monitor outpatient 21. GERD: transitioned protonix to po  D/c today  LOS: 25 days A FACE TO FACE EVALUATION WAS PERFORMED  Kaydince Towles 11/04/2021, 9:00 AM

## 2021-11-06 NOTE — Progress Notes (Signed)
Patient ID: Raymond Stuart, male   DOB: 17-Apr-1951, 70 y.o.   MRN: [id number]  [facility] unable to service patient due to location. [facility] referral sent to [facility]

## 2021-11-22 ENCOUNTER — Telehealth: Payer: Self-pay

## 2021-11-22 NOTE — Telephone Encounter (Signed)
Katie from [facility] called and stated the patient has not been contacted to start PT at home because the orders were faxed to his primary care at [facility] and they wanted to see him. She stated the wife of the patient was livid at them and informed her that our department discharged him. Informed her that I could give her verbal orders to start PT for the patient.

## 2022-01-03 ENCOUNTER — Telehealth: Payer: Self-pay | Admitting: *Deleted

## 2022-01-03 DIAGNOSIS — I639 Cerebral infarction, unspecified: Secondary | ICD-10-CM

## 2022-01-03 DIAGNOSIS — A419 Sepsis, unspecified organism: Secondary | ICD-10-CM

## 2022-01-03 NOTE — Telephone Encounter (Signed)
Raymond Stuart PT Called to say Raymond Stuart has met most of his goals with [facility] and is requesting he be referred to outpt PT for continued therapy.Raymond Stuart lives in [place] and would like to be referred to [facility]. Referral placed to  [email or web address] [address] [place], [place], [place] [address]  ~14.7 mi [phone].

## 2022-01-10 ENCOUNTER — Telehealth: Payer: Self-pay

## 2022-01-10 NOTE — Telephone Encounter (Signed)
Patient wife called stating Linna Hoff the PA on rehab needed to know the doctor that put patient feeding tube in. Dr. Sharen Heck Mir was the doctor that put the feeding tube in. Patient wife is asking for a phone call about getting it removed.

## 2022-01-23 ENCOUNTER — Encounter
Payer: Medicare Other | Attending: Physical Medicine and Rehabilitation | Admitting: Physical Medicine and Rehabilitation

## 2022-01-23 ENCOUNTER — Other Ambulatory Visit: Payer: Self-pay

## 2022-01-23 ENCOUNTER — Encounter: Payer: Self-pay | Admitting: Physical Medicine and Rehabilitation

## 2022-01-23 VITALS — BP 132/84 | HR 91 | Temp 98.1°F | Ht 72.0 in | Wt 199.2 lb

## 2022-01-23 DIAGNOSIS — E43 Unspecified severe protein-calorie malnutrition: Secondary | ICD-10-CM | POA: Insufficient documentation

## 2022-01-23 DIAGNOSIS — M5430 Sciatica, unspecified side: Secondary | ICD-10-CM | POA: Insufficient documentation

## 2022-01-23 DIAGNOSIS — A419 Sepsis, unspecified organism: Secondary | ICD-10-CM | POA: Insufficient documentation

## 2022-01-23 DIAGNOSIS — Z13220 Encounter for screening for lipoid disorders: Secondary | ICD-10-CM | POA: Diagnosis present

## 2022-01-23 DIAGNOSIS — R5381 Other malaise: Secondary | ICD-10-CM | POA: Insufficient documentation

## 2022-01-23 NOTE — Patient Instructions (Addendum)
Bioptemizer's: all 7 types of magnesium.   Provided with list of supplements that can help with dyslipidemia: 1) Vitamin B3 500-4,000mg  in divided doses daily (would recommend starting low as can cause uncomfortable facial flushing if started at too high a dose) 2) Phytosterols 2.15 grams daily 3) Fermented soy 30-50 grams daily 4) EGCG (found in green tea): 500-1000mg  daily 5) Omega-3 fatty acids 3000-5,000mg  daily 6) Flax seed 40 grams daily 7) Monounsaturated fats 20-40 grams daily (olives, olive oil, nuts), also reduces cardiovascular disease 8) Sesame: 40 grams daily 9) Gamma/delta tocotrienols- a family of unsaturated forms of Vitamin E- 200mg  with dinner 10) Pantethine 900mg  daily in divided doses 11) Resveratrol 250mg  daily 12) N Acetyl Cysteine 2000mg  daily in divided doses 13) Curcumin 2000-5000mg  in divided doses daily 14) Pomegranate juice: 8 ounces daily, also helps to lower blood pressure 15) Pomegranate seeds one cup daily, also helps to lower blood pressure 16) Citrus Bergamot 1000mg  daily, also helps with glucose control and weight loss 17) Vitamin C 500mg  daily 18) Quercetin 500-1000mg  daily 19) Glutathione 20) Probiotics 60-100 billion organisms per day 21) Fiber 22) Oats 23) Aged garlic (can eat as food or supplement of 600-900mg  per day) 24) Chia seeds 25 grams per day 25) Lycopene- carotenoid found in high concentrations in tomatoes. 26) Alpha linolenic acid 27) Flavonoids and anthocyanins 28) Wogonin- flavanoid that enhances reverse cholesterol transport 29) Coenzyme Q10 30) Pantethine- derivative of Vitamin B5: 300mg  three times per day or 450mg  twice per day with or without food 31) Barley and other whole grains 32) Orange juice 33) L- carnitine 34) L- Lysine 35) L- Arginine 36) Almonds 37) Morin 38) Rutin 39) Carnosine 40) Histidine  41) Kaempferol  42) Organosulfur compounds 43) Vitamin E 44) Oleic acid 45) RBO (ferulic acid  gammaoryzanol) 46) grape seed extract 47) Red wine 48) Berberine HCL 500mg  daily or twice per day- more effective and with fewer adverse effects that ezetimibe monotherapy 49) red yeast rice 2400- 4800 mg/day 50) chlorella 51) Licorice

## 2022-01-23 NOTE — Progress Notes (Signed)
Subjective:    Patient ID: Raymond Stuart, male    DOB: [birth year], 71 y.o.   MRN: [id number]  HPI Raymond Stuart is a 71 year old man who is presents for CIR follow-up after admission for debility. Having muscle soreness. Denies pain Doing outpatient therapy Wants to get the PEG removed.  Walking with walker Eating very well 199 lbs up from 173.  Threw up once when he took Tylenol He has Tramadol 5 pills He and his wife walk 1 miles per day with his wife.  He likes to hunt, play golf, and mow his yard.  Breathing is so much better.   Pain Inventory Average Pain 2 Pain Right Now 2 My pain is aching  In the last 24 hours, has pain interfered with the following? General activity 2 Relation with others 1 Enjoyment of life 2 What TIME of day is your pain at its worst? night Sleep (in general) Fair  Pain is worse with: bending and some activites Pain improves with: rest, heat/ice, and medication Relief from Meds: 6  walk without assistance walk with assistance use a walker how many minutes can you walk? 30 ability to climb steps?  yes do you drive?  no  retired I need assistance with the following:  meal prep, household duties, and shopping  loss of taste or smell  Any changes since last visit?  no  Any changes since last visit?  no    Family History  Problem Relation Age of Onset   Hypertension Mother    Social History   Socioeconomic History   Marital status: Married    Spouse name: Not on file   Number of children: Not on file   Years of education: Not on file   Highest education level: Not on file  Occupational History   Not on file  Tobacco Use   Smoking status: Never   Smokeless tobacco: Never  Substance and Sexual Activity   Alcohol use: No   Drug use: Not on file   Sexual activity: Not on file  Other Topics Concern   Not on file  Social History Narrative   Not on file   Social Determinants of Health   Financial Resource Strain: Not on file   Food Insecurity: Not on file  Transportation Needs: Not on file  Physical Activity: Not on file  Stress: Not on file  Social Connections: Not on file   Past Surgical History:  Procedure Laterality Date   LITHOTRIPSY     Past Medical History:  Diagnosis Date   GERD (gastroesophageal reflux disease)    Hyperlipidemia    Joint pain    Kidney stone    BP 132/84    Pulse 91    Temp 98.1 F (36.7 C)    Ht 6' (1.829 m)    Wt 199 lb 3.2 oz (90.4 kg)    SpO2 97%    BMI 27.02 kg/m   Opioid Risk Score:   Fall Risk Score:  `1  Depression screen PHQ 2/9  Depression screen PHQ 2/9 01/23/2022  Decreased Interest 0  Down, Depressed, Hopeless 0  PHQ - 2 Score 0  Altered sleeping 1  Tired, decreased energy 1  Change in appetite 0  Feeling bad or failure about yourself  0  Trouble concentrating 0  Moving slowly or fidgety/restless 1  Suicidal thoughts 0  PHQ-9 Score 3     Review of Systems  Constitutional:  Positive for unexpected weight change.  Gained weight since home  HENT: Negative.    Eyes: Negative.   Respiratory: Negative.    Cardiovascular: Negative.   Gastrointestinal: Negative.   Endocrine: Negative.   Genitourinary: Negative.   Musculoskeletal:  Positive for back pain and gait problem.  Skin:  Positive for rash.       itching  Allergic/Immunologic: Negative.   Hematological: Negative.   Psychiatric/Behavioral: Negative.    All other systems reviewed and are negative.     Objective:   Physical Exam  Gen: no distress, normal appearing HEENT: oral mucosa pink and moist, NCAT Cardio: Reg rate Chest: normal effort, normal rate of breathing Abd: soft, non-distended Ext: no edema Psych: pleasant, normal affect Skin: intact Neuro/Musculoskeletal:  Ambulating with RW      Assessment & Plan:  1) Impaired balance: -continue therapy  2) Protein calorie malnutrition: -placed removal for PEG removal -commended on great weight gain!  3) Sciatic nerve  pain: -discussed that he could wean to Gabapentin 300mg  HS.  -use tramadol sparingly  RETURN TO DRIVING PLAN:  WITH THE SUPERVISION OF A LICENSED DRIVER, PLEASE DRIVE IN AN EMPTY PARKING LOT FOR AT LEAST 2-3 TRIALS TO TEST REACTION TIME, VISION, USE OF EQUIPMENT IN CAR, ETC.  IF SUCCESSFUL WITH THE PARKING LOT DRIVING, PROCEED TO SUPERVISED DRIVING TRIALS IN YOUR NEIGHBORHOOD STREETS AT LOW TRAFFIC TIMES TO TEST OBSERVATION TO TRAFFIC SIGNALS, REACTION TIME, ETC. PLEASE ATTEMPT AT LEAST 2-3 TRIALS IN YOUR NEIGHBORHOOD.  IF NEIGHBORHOOD DRIVING IS SUCCESSFUL, YOU MAY PROCEED TO DRIVING IN BUSIER AREAS IN YOUR COMMUNITY WITH SUPERVISION OF A LICENSED DRIVER. PLEASE ATTEMPT AT LEAST 4-5 TRIALS.

## 2022-01-24 ENCOUNTER — Encounter
Payer: Medicare Other | Attending: Physical Medicine and Rehabilitation | Admitting: Physical Medicine and Rehabilitation

## 2022-01-24 DIAGNOSIS — Z13220 Encounter for screening for lipoid disorders: Secondary | ICD-10-CM | POA: Diagnosis not present

## 2022-01-24 LAB — LIPID PANEL W/O CHOL/HDL RATIO
Cholesterol, Total: 139 mg/dL (ref 100–199)
HDL: 37 mg/dL — ABNORMAL LOW (ref >39)
LDL Chol Calc (NIH): 72 mg/dL (ref 0–99)
Triglycerides: 174 mg/dL — ABNORMAL HIGH (ref 0–149)
VLDL Cholesterol Cal: 30 mg/dL (ref 5–40)

## 2022-01-25 NOTE — Progress Notes (Signed)
? ?Subjective:  ? ? Patient ID: Raymond Stuart, male    DOB: 06/10/1951, 71 y.o.   MRN: [id number] ? ?HPI ?An audio/video tele-health visit is felt to be the most appropriate encounter for this patient at this time. This is a follow up tele-visit via phone. The patient is at home. MD is at office. Prior to scheduling this appointment, our staff discussed the limitations of evaluation and management by telemedicine and the availability of in-person appointments. The patient expressed understanding and agreed to proceed.  ? ?Raymond Stuart is a 71 year old man who is presents for CIR follow-up after admission for debility and stroke. ?Having muscle soreness. Denies pain ?Doing outpatient therapy ?He asks about the results of his lipid panel.  ?Wants to get the PEG removed.  ?Walking with walker ?Eating very well ?199 lbs up from 173.  ?Threw up once when he took Tylenol ?He has Tramadol 5 pills ?He and his wife walk 1 miles per day with his wife.  ?He likes to hunt, play golf, and mow his yard.  ?Breathing is so much better.  ? ?Pain Inventory ?Average Pain 2 ?Pain Right Now 2 ?My pain is aching ? ?In the last 24 hours, has pain interfered with the following? ?General activity 2 ?Relation with others 1 ?Enjoyment of life 2 ?What TIME of day is your pain at its worst? night ?Sleep (in general) Fair ? ?Pain is worse with: bending and some activites ?Pain improves with: rest, heat/ice, and medication ?Relief from Meds: 6 ? ?walk without assistance ?walk with assistance ?use a walker ?how many minutes can you walk? 30 ?ability to climb steps?  yes ?do you drive?  no ? ?retired ?I need assistance with the following:  meal prep, household duties, and shopping ? ?loss of taste or smell ? ?Any changes since last visit?  no ? ?Any changes since last visit?  no ? ? ? ?Family History  ?Problem Relation Age of Onset  ? Hypertension Mother   ? ?Social History  ? ?Socioeconomic History  ? Marital status: Married  ?  Spouse name: Not on file   ? Number of children: Not on file  ? Years of education: Not on file  ? Highest education level: Not on file  ?Occupational History  ? Not on file  ?Tobacco Use  ? Smoking status: Never  ? Smokeless tobacco: Never  ?Substance and Sexual Activity  ? Alcohol use: No  ? Drug use: Not on file  ? Sexual activity: Not on file  ?Other Topics Concern  ? Not on file  ?Social History Narrative  ? Not on file  ? ?Social Determinants of Health  ? ?Financial Resource Strain: Not on file  ?Food Insecurity: Not on file  ?Transportation Needs: Not on file  ?Physical Activity: Not on file  ?Stress: Not on file  ?Social Connections: Not on file  ? ?Past Surgical History:  ?Procedure Laterality Date  ? LITHOTRIPSY    ? ?Past Medical History:  ?Diagnosis Date  ? GERD (gastroesophageal reflux disease)   ? Hyperlipidemia   ? Joint pain   ? Kidney stone   ? ?There were no vitals taken for this visit. ? ?Opioid Risk Score:   ?Fall Risk Score:  `1 ? ?Depression screen PHQ 2/9 ? ?Depression screen [facility] 2/9 01/23/2022  ?Decreased Interest 0  ?Down, Depressed, Hopeless 0  ?PHQ - 2 Score 0  ?Altered sleeping 1  ?Tired, decreased energy 1  ?Change in appetite 0  ?Feeling  bad or failure about yourself  0  ?Trouble concentrating 0  ?Moving slowly or fidgety/restless 1  ?Suicidal thoughts 0  ?PHQ-9 Score 3  ?  ? ?Review of Systems  ?Constitutional:  Positive for unexpected weight change.  ?     Gained weight since home  ?HENT: Negative.    ?Eyes: Negative.   ?Respiratory: Negative.    ?Cardiovascular: Negative.   ?Gastrointestinal: Negative.   ?Endocrine: Negative.   ?Genitourinary: Negative.   ?Musculoskeletal:  Positive for back pain and gait problem.  ?Skin:  Positive for rash.  ?     itching  ?Allergic/Immunologic: Negative.   ?Hematological: Negative.   ?Psychiatric/Behavioral: Negative.    ?All other systems reviewed and are negative. ? ?   ?Objective:  ? Physical Exam ? ?Not performed as patient was seen via phone ? ?   ?Assessment & Plan:   ?1) Impaired balance: ?-continue therapy ? ?2) Protein calorie malnutrition: ?-placed removal for PEG removal ?-commended on great weight gain! ? ?3) Sciatic nerve pain: ?-discussed that he could wean to Gabapentin 300mg  HS.  ?-use tramadol sparingly ? ?RETURN TO DRIVING PLAN: ? ?WITH THE SUPERVISION OF A LICENSED DRIVER, PLEASE DRIVE IN AN EMPTY PARKING LOT FOR AT LEAST 2-3 TRIALS TO TEST REACTION TIME, VISION, USE OF EQUIPMENT IN CAR, ETC. ? ?IF SUCCESSFUL WITH THE PARKING LOT DRIVING, PROCEED TO SUPERVISED DRIVING TRIALS IN YOUR NEIGHBORHOOD STREETS AT LOW TRAFFIC TIMES TO TEST OBSERVATION TO TRAFFIC SIGNALS, REACTION TIME, ETC. PLEASE ATTEMPT AT LEAST 2-3 TRIALS IN YOUR NEIGHBORHOOD. ? ?IF NEIGHBORHOOD DRIVING IS SUCCESSFUL, YOU MAY PROCEED TO DRIVING IN BUSIER AREAS IN YOUR COMMUNITY WITH SUPERVISION OF A LICENSED DRIVER. PLEASE ATTEMPT AT LEAST 4-5 TRIALS. ? ?4) History of CVA ?-discussed this was related to anoxic brain injury and not elevated cholesterol levels ?-given his current muscle soreness, repeated lipid panel and discussed results with patient and wife. Shows normal LDL and VLDL. Discussed decreasing statin to 20mg  to see if his muscle soreness improves, script sent for Zocor 20mg  to [facility] ?-recommend repeating lipid panel in 3 months (around 04/28/22) with his PCP to see if further adjustment is needed at that time.  ? ?5 minutes spent in reviewing lipid panel with patient and wife, discussing that LDL and VLDL are normal, discussing option of decreasing Zocor to 20mg  to see if muscle soreness improves, and repeating labs in 3 months ? ? ? ?

## 2022-01-26 MED ORDER — SIMVASTATIN 20 MG PO TABS: 20.0000 mg | ORAL_TABLET | Freq: Every day | ORAL | 3 refills | Status: DC

## 2022-01-29 ENCOUNTER — Telehealth: Payer: Self-pay | Admitting: *Deleted

## 2022-01-29 NOTE — Telephone Encounter (Signed)
I have given the number to scheduling for Interventional Radiology to Raymond Stuart to call and schedule his peg tube removal.  [phone] or [phone]. ?

## 2022-02-01 ENCOUNTER — Other Ambulatory Visit: Payer: Self-pay

## 2022-02-01 ENCOUNTER — Ambulatory Visit (HOSPITAL_COMMUNITY)
Admission: RE | Admit: 2022-02-01 | Discharge: 2022-02-01 | Disposition: A | Discharge status: Home / Self Care | Payer: Medicare Other | Source: Ambulatory Visit | Attending: Physical Medicine and Rehabilitation | Admitting: Physical Medicine and Rehabilitation

## 2022-02-01 DIAGNOSIS — A419 Sepsis, unspecified organism: Secondary | ICD-10-CM | POA: Insufficient documentation

## 2022-02-01 DIAGNOSIS — Z431 Encounter for attention to gastrostomy: Secondary | ICD-10-CM | POA: Diagnosis present

## 2022-02-01 HISTORY — PX: IR GASTROSTOMY TUBE REMOVAL: IMG5492

## 2022-02-01 MED ORDER — LIDOCAINE VISCOUS HCL 2 % MT SOLN
OROMUCOSAL | Status: AC
  Filled 2022-02-01: qty 15

## 2022-03-27 ENCOUNTER — Encounter (HOSPITAL_COMMUNITY): Payer: Self-pay | Admitting: Oncology

## 2022-03-27 ENCOUNTER — Other Ambulatory Visit: Payer: Self-pay

## 2022-03-27 ENCOUNTER — Emergency Department (HOSPITAL_COMMUNITY): Payer: Medicare Other

## 2022-03-27 ENCOUNTER — Observation Stay (HOSPITAL_COMMUNITY)
Admission: EM | Admit: 2022-03-27 | Discharge: 2022-03-29 | Disposition: A | Discharge status: Home / Self Care | Payer: Medicare Other | Attending: Internal Medicine | Admitting: Internal Medicine

## 2022-03-27 DIAGNOSIS — Z79899 Other long term (current) drug therapy: Secondary | ICD-10-CM | POA: Insufficient documentation

## 2022-03-27 DIAGNOSIS — Z8673 Personal history of transient ischemic attack (TIA), and cerebral infarction without residual deficits: Secondary | ICD-10-CM

## 2022-03-27 DIAGNOSIS — G459 Transient cerebral ischemic attack, unspecified: Principal | ICD-10-CM | POA: Insufficient documentation

## 2022-03-27 DIAGNOSIS — K219 Gastro-esophageal reflux disease without esophagitis: Secondary | ICD-10-CM | POA: Diagnosis present

## 2022-03-27 DIAGNOSIS — R42 Dizziness and giddiness: Secondary | ICD-10-CM | POA: Diagnosis present

## 2022-03-27 DIAGNOSIS — I10 Essential (primary) hypertension: Secondary | ICD-10-CM | POA: Insufficient documentation

## 2022-03-27 DIAGNOSIS — R7881 Bacteremia: Secondary | ICD-10-CM | POA: Insufficient documentation

## 2022-03-27 DIAGNOSIS — E785 Hyperlipidemia, unspecified: Secondary | ICD-10-CM | POA: Diagnosis present

## 2022-03-27 DIAGNOSIS — Z7982 Long term (current) use of aspirin: Secondary | ICD-10-CM | POA: Insufficient documentation

## 2022-03-27 LAB — ETHANOL: Alcohol, Ethyl (B): <10 mg/dL (ref <10)

## 2022-03-27 LAB — RAPID URINE DRUG SCREEN, HOSP PERFORMED
Amphetamines: NOT DETECTED
Barbiturates: NOT DETECTED
Benzodiazepines: NOT DETECTED
Cocaine: NOT DETECTED
Opiates: NOT DETECTED
Tetrahydrocannabinol: NOT DETECTED

## 2022-03-27 LAB — CBC
HCT: 46.1 % (ref 39.0–52.0)
Hemoglobin: 14.8 g/dL (ref 13.0–17.0)
MCH: 27.9 pg (ref 26.0–34.0)
MCHC: 32.1 g/dL (ref 30.0–36.0)
MCV: 86.8 fL (ref 80.0–100.0)
Platelets: 238 10*3/uL (ref 150–400)
RBC: 5.31 MIL/uL (ref 4.22–5.81)
RDW: 14.6 % (ref 11.5–15.5)
WBC: 8 10*3/uL (ref 4.0–10.5)
nRBC: 0 % (ref 0.0–0.2)

## 2022-03-27 LAB — URINALYSIS, ROUTINE W REFLEX MICROSCOPIC
Bilirubin Urine: NEGATIVE
Glucose, UA: NEGATIVE mg/dL
Hgb urine dipstick: NEGATIVE
Ketones, ur: NEGATIVE mg/dL
Nitrite: NEGATIVE
Protein, ur: NEGATIVE mg/dL
Specific Gravity, Urine: 1.015 (ref 1.005–1.030)
WBC, UA: >50 WBC/hpf — ABNORMAL HIGH (ref 0–5)
pH: 6 (ref 5.0–8.0)

## 2022-03-27 LAB — COMPREHENSIVE METABOLIC PANEL
ALT: 20 U/L (ref 0–44)
AST: 18 U/L (ref 15–41)
Albumin: 4 g/dL (ref 3.5–5.0)
Alkaline Phosphatase: 50 U/L (ref 38–126)
Anion gap: 4 — ABNORMAL LOW (ref 5–15)
BUN: 17 mg/dL (ref 8–23)
CO2: 24 mmol/L (ref 22–32)
Calcium: 9.1 mg/dL (ref 8.9–10.3)
Chloride: 112 mmol/L — ABNORMAL HIGH (ref 98–111)
Creatinine, Ser: 1.11 mg/dL (ref 0.61–1.24)
GFR, Estimated: >60 mL/min (ref >60)
Glucose, Bld: 126 mg/dL — ABNORMAL HIGH (ref 70–99)
Potassium: 4.2 mmol/L (ref 3.5–5.1)
Sodium: 140 mmol/L (ref 135–145)
Total Bilirubin: 0.5 mg/dL (ref 0.3–1.2)
Total Protein: 7.7 g/dL (ref 6.5–8.1)

## 2022-03-27 LAB — CBG MONITORING, ED: Glucose-Capillary: 103 mg/dL — ABNORMAL HIGH (ref 70–99)

## 2022-03-27 LAB — HEMOGLOBIN A1C
Hgb A1c MFr Bld: 5.8 % — ABNORMAL HIGH (ref 4.8–5.6)
Mean Plasma Glucose: 119.76 mg/dL

## 2022-03-27 MED ORDER — ASPIRIN 325 MG PO TABS
325.0000 mg | ORAL_TABLET | Freq: Once | ORAL | Status: AC
  Administered 2022-03-27: 325 mg via ORAL
  Filled 2022-03-27: qty 1

## 2022-03-27 NOTE — Plan of Care (Signed)
On-call consultation over the phone ? ?Called by Dr.  Karene Fry at [facility] ? 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  MRI negative.  Being admitted to [facility] hospitalist service.  Please call inpatient neurology service at [facility] once he arrives for formal consultation. ? ?-- ?Milon Dikes, MD ?Neurologist ?[facility] ?Pager: [phone] ? ?

## 2022-03-27 NOTE — H&P (Signed)
?History and Physical  ? ? ?Patient: Raymond Stuart OAC:[id number] DOB: 04/16/1951 ?DOA: 03/27/2022 ?DOS: the patient was seen and examined on 03/27/2022 ?PCP: Caffie Damme, MD  ?Patient coming from: Home ? ?Chief Complaint:  ?Chief Complaint  ?Patient presents with  ? Dizziness  ? ?HPI: Raymond Stuart is a 71 y.o. male with medical history significant of previous SDH, ICH, HLD, GERD. Presenting with dizziness. He was last known to be normal around 2300hrs last night. He reports at 0200hrs this morning, he felt dizzy while trying to use the bathroom. He dismissed it and went back to sleep. This morning around 0830hrs, he was trying to get out of bed, but he felt dizzy again. He called for his family. They checked his BP and it was elevated. He felt nauseous, but didn't have any vomiting. At that point, they decided to call EMS for help. He denies any other aggravating or alleviating factors.  ?  ?Review of Systems: As mentioned in the history of present illness. All other systems reviewed and are negative. ?Past Medical History:  ?Diagnosis Date  ? GERD (gastroesophageal reflux disease)   ? Hyperlipidemia   ? Joint pain   ? Kidney stone   ? ?Past Surgical History:  ?Procedure Laterality Date  ? IR GASTROSTOMY TUBE REMOVAL  02/01/2022  ? LITHOTRIPSY    ? ?Social History:  reports that he has never smoked. He has never used smokeless tobacco. He reports that he does not drink alcohol. No history on file for drug use. ? ?Allergies  ?Allergen Reactions  ? Gabapentin Other (See Comments)  ?  Was stopped by a hospitalist because of the unwanted possible side effect of drowsiness  ? Statins Other (See Comments)  ?  Unnamed statin was stopped by a hospitalist because liver enzymes became elevated- possible contraindication  ? ? ?Family History  ?Problem Relation Age of Onset  ? Hypertension Mother   ? ? ?Prior to Admission medications   ?Medication Sig Start Date End Date Taking? Authorizing Provider  ?aspirin 81 MG chewable tablet  Chew 1 tablet (81 mg total) by mouth daily. 11/02/21   Angiulli, Mcarthur Rossetti, PA-C  ?diclofenac Sodium (VOLTAREN) 1 % GEL Apply 2 g topically 4 (four) times daily as needed (Left shoulder pain). 11/02/21   Angiulli, Mcarthur Rossetti, PA-C  ?gabapentin (NEURONTIN) 300 MG capsule Take 300 mg by mouth 3 (three) times daily. 12/28/21   [provider]  ?magnesium oxide (MAG-OX) 400 MG tablet Take 1 tablet (400 mg total) by mouth at bedtime. 11/02/21   Angiulli, Mcarthur Rossetti, PA-C  ?Multiple Vitamins-Minerals (MULTIVITAMIN ADULT) CHEW Place 1 tablet into feeding tube in the morning.    [provider]  ?saccharomyces boulardii (FLORASTOR) 250 MG capsule Take 1 capsule (250 mg total) by mouth 2 (two) times daily. 11/02/21   Angiulli, Mcarthur Rossetti, PA-C  ?simvastatin (ZOCOR) 20 MG tablet Take 1 tablet (20 mg total) by mouth daily. 01/26/22   Raulkar, Drema Pry, MD  ?traMADol (ULTRAM) 50 MG tablet Take 50 mg by mouth daily as needed. 12/28/21   [provider]  ?Vitamin D3 (VITAMIN D) 25 MCG tablet Take 1 tablet (1,000 Units total) by mouth in the morning. 11/02/21   Angiulli, Mcarthur Rossetti, PA-C  ? ? ?Physical Exam: ?Vitals:  ? 03/27/22 1258 03/27/22 1421 03/27/22 1456 03/27/22 1515  ?BP:  (!) 156/87 131/84 131/82  ?Pulse: 75 74 73 67  ?Resp: (!) 22 17 (!) 22 14  ?Temp:      ?TempSrc:      ?  SpO2: 94% 100% 96% 99%  ? ?General: 71 y.o. male resting in bed in NAD ?Eyes: PERRL, normal sclera ?ENMT: Nares patent w/o discharge, orophaynx clear, dentition normal, ears w/o discharge/lesions/ulcers ?Neck: Supple, trachea midline ?Cardiovascular: RRR, +S1, S2, no m/g/r, equal pulses throughout ?Respiratory: CTABL, no w/r/r, normal WOB ?GI: BS+, NDNT, no masses noted, no organomegaly noted ?MSK: No e/c/c ?Neuro: A&O x 3, no focal deficits ?Psyc: Appropriate interaction and affect, calm/cooperative ? ?Data Reviewed: ? ?Na+  140 ?Glucose 126 ?Scr  1.11 ?WBC  8.0 ?Hgb  14.8 ? ? ?CXR: Hypoinflation of the lungs with minimal bibasilar  subsegmental ?atelectasis. ? ?CTH:  ?1. Encephalomalacia in the right frontal lobe compatible with region of prior contusion/hematoma. ?2. Small region of peripheral encephalomalacia in the right cerebellum compatible with remote infarct. ?3. Periventricular white matter and corona radiata hypodensities favor chronic ischemic microvascular white matter disease. ?4. Reduced conspicuity of the previous nondisplaced right occipital bone fracture due to interval healing response. ?5. No acute intracranial findings are identified. ? ?MRI Brain ?MRA Head ?MRA Neck ?No acute infarction, hemorrhage, or mass. ?Sequelae of prior trauma and ischemia. ?No large vessel occlusion or hemodynamically significant stenosis. ?No evidence of dissection. ? ?Assessment and Plan: ?No notes have been filed under this hospital service. ?Service: Hospitalist ?TIA ?History of stroke ?    - place in obs, tele @ [facility] ?    - imaging as above ?    - Neurology to see ?    - Swallow screen ?    - PT/OT/SLP/TOC ?    - echo, lipids, A1c ?    - permissive HTN ?    - ASA, statin ? ?HTN ?    - permissive HTN for now ? ?HLD ?    - statin ? ?GERD ?    - PPI ? ?Advance Care Planning:   Code Status: FULL ? ?Consults: Neurology ? ?Family Communication: w/ wife and dtr at bedside ? ?Severity of Illness: ?The appropriate patient status for this patient is OBSERVATION. Observation status is judged to be reasonable and necessary in order to provide the required intensity of service to ensure the patient's safety. The patient's presenting symptoms, physical exam findings, and initial radiographic and laboratory data in the context of their medical condition is felt to place them at decreased risk for further clinical deterioration. Furthermore, it is anticipated that the patient will be medically stable for discharge from the hospital within 2 midnights of admission.  ? ?Author: ?Teddy Spike, DO ?03/27/2022 3:34 PM ? ?For on call review [email or web address].  ?

## 2022-03-27 NOTE — ED Notes (Signed)
Raymond Stuart- dtr [phone] ? ?

## 2022-03-27 NOTE — ED Provider Notes (Signed)
?[facility]-EMERGENCY DEPT ?Provider Note ? ? ?CSN: [id number] ?Arrival date & time: 03/27/22  1202 ? ?  ? ?History ? ?Chief Complaint  ?Patient presents with  ? Dizziness  ? ? ?Raymond Stuart is a 71 y.o. male. ? ? ?Dizziness ?Associated symptoms: nausea   ? 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  ? ?This was provided with the patient who states that he was last normal last night when he went to sleep at 2300.  He woke up around 0 200 and felt slightly dizzy with room spinning dizziness.  When he got up this morning he noticed that symptoms have not subsided.  He decided to present to the emergency department for further evaluation.  He states that symptoms ultimately did subside around 0930 this morning.  He described room spinning dizziness, unsteadiness on his feet, mild nausea.  He denied any other symptoms.  No chest pain or shortness of breath.  No vision loss, facial droop or numbness, extremity weakness or numbness, dysarthria or dysphagia.  He presented to the [facility] long emergency department for further evaluation.  He states that symptoms have completely resolved. ? ?Home Medications ?Prior to Admission medications   ?Medication Sig Start Date End Date Taking? Authorizing Provider  ?aspirin 81 MG chewable tablet Chew 1 tablet (81 mg total) by mouth daily. 11/02/21  Yes Angiulli, Mcarthur Rossetti, PA-C  ?Chlorphen-Pseudoephed-APAP (CORICIDIN D PO) Take 1 tablet by mouth daily as needed (allergies).   Yes [provider]  ?ELDERBERRY PO Take 3,200 mg by mouth daily.   Yes [provider]  ?esomeprazole (NEXIUM) 20 MG capsule Take 40 mg by mouth daily at 12 noon.   Yes [provider]  ?gabapentin (NEURONTIN) 300 MG capsule Take 300 mg by mouth 2  (two) times daily. 12/28/21  Yes [provider]  ?saccharomyces boulardii (FLORASTOR) 250 MG capsule Take 1 capsule (250 mg total) by mouth 2 (two) times daily. ?Patient taking differently: Take 250 mg by mouth daily. 11/02/21  Yes Angiulli, Mcarthur Rossetti, PA-C  ?simvastatin (ZOCOR) 40 MG tablet Take 40 mg by mouth every other day.   Yes [provider]  ?traMADol (ULTRAM) 50 MG tablet Take 50 mg by mouth daily as needed for moderate pain. 12/28/21  Yes [provider]  ?Vitamin D3 (VITAMIN D) 25 MCG tablet Take 1 tablet (1,000 Units total) by mouth in the morning. 11/02/21  Yes Angiulli, Mcarthur Rossetti, PA-C  ?diclofenac Sodium (VOLTAREN) 1 % GEL Apply 2 g topically 4 (four) times daily as needed (Left shoulder pain). ?Patient not taking: Reported on 03/27/2022 11/02/21   Charlton Amor, PA-C  ?magnesium oxide (MAG-OX) 400 MG tablet Take 1 tablet (400 mg total) by mouth at bedtime. ?Patient not taking: Reported on 03/27/2022 11/02/21   Charlton Amor, PA-C  ?simvastatin (ZOCOR) 20 MG tablet Take 1 tablet (20 mg total) by mouth daily. ?Patient not taking: Reported on 03/27/2022 01/26/22   Horton Chin, MD  ?   ? ?Allergies    ?Gabapentin and Statins   ? ?Review of Systems   ?Review of Systems  ?Gastrointestinal:  Positive for nausea.  ?Neurological:  Positive for dizziness.  ?All other systems reviewed and are negative. ? ?Physical Exam ?Updated Vital Signs ?BP (!) 148/77   Pulse 77  Temp 97.9 ?F (36.6 ?C)   Resp 16   SpO2 99%  ?Physical Exam ?Vitals and nursing note reviewed.  ?Constitutional:   ?   General: He is not in acute distress. ?   Appearance: He is well-developed.  ?HENT:  ?   Head: Normocephalic and atraumatic.  ?Eyes:  ?   Conjunctiva/sclera: Conjunctivae normal.  ?Cardiovascular:  ?   Rate and Rhythm: Normal rate and regular rhythm.  ?   Heart sounds: No murmur heard. ?Pulmonary:  ?   Effort: Pulmonary effort is normal. No respiratory distress.  ?   Breath sounds: Normal breath  sounds.  ?Abdominal:  ?   Palpations: Abdomen is soft.  ?   Tenderness: There is no abdominal tenderness.  ?Musculoskeletal:     ?   General: No swelling.  ?   Cervical back: Neck supple.  ?Skin: ?   General: Skin is warm and dry.  ?   Capillary Refill: Capillary refill takes less than 2 seconds.  ?Neurological:  ?   Mental Status: He is alert.  ?   Comments: MENTAL STATUS EXAM:    ?Orientation: Alert and oriented to person, place and time.  ?Memory: Cooperative, follows commands well.  ?Language: Speech is clear and language is normal.  ? ?CRANIAL NERVES:    ?CN 2 (Optic): Visual fields intact to confrontation.  ?CN 3,4,6 (EOM): Pupils equal and reactive to light. Full extraocular eye movement without nystagmus.  ?CN 5 (Trigeminal): Facial sensation is normal, no weakness of masticatory muscles.  ?CN 7 (Facial): No facial weakness or asymmetry.  ?CN 8 (Auditory): Auditory acuity grossly normal.  ?CN 9,10 (Glossophar): The uvula is midline, the palate elevates symmetrically.  ?CN 11 (spinal access): Normal sternocleidomastoid and trapezius strength.  ?CN 12 (Hypoglossal): The tongue is midline. No atrophy or fasciculations..  ? ?MOTOR:  Muscle Strength: 5/5RUE, 5/5LUE, 5/5RLE, 5/5LLE.  ? ?COORDINATION:   Intact finger-to-nose, no tremor, no pronator drift.  ? ?SENSATION:   Intact to light touch all four extremities. ? ?GAIT: Gait normal without ataxia ?  ?Psychiatric:     ?   Mood and Affect: Mood normal.  ? ? ?ED Results / Procedures / Treatments   ?Labs ?(all labs ordered are listed, but only abnormal results are displayed) ?Labs Reviewed  ?COMPREHENSIVE METABOLIC PANEL - Abnormal; Notable for the following components:  ?    Result Value  ? Chloride 112 (*)   ? Glucose, Bld 126 (*)   ? Anion gap 4 (*)   ? All other components within normal limits  ?HEMOGLOBIN A1C - Abnormal; Notable for the following components:  ? Hgb A1c MFr Bld 5.8 (*)   ? All other components within normal limits  ?URINALYSIS, ROUTINE W REFLEX  MICROSCOPIC - Abnormal; Notable for the following components:  ? APPearance HAZY (*)   ? Leukocytes,Ua LARGE (*)   ? WBC, UA >50 (*)   ? Bacteria, UA RARE (*)   ? All other components within normal limits  ?CBG MONITORING, ED - Abnormal; Notable for the following components:  ? Glucose-Capillary 103 (*)   ? All other components within normal limits  ?RAPID URINE DRUG SCREEN, HOSP PERFORMED  ?CBC  ?ETHANOL  ? ? ?EKG ?EKG Interpretation ? ?Date/Time:  Tuesday Mar 27 2022 12:12:02 EDT ?Ventricular Rate:  76 ?PR Interval:  194 ?QRS Duration: 126 ?QT Interval:  390 ?QTC Calculation: 439 ?R Axis:   19 ?Text Interpretation: Sinus rhythm Right bundle branch block Baseline wander in lead(s) II  III aVF V2 Confirmed by Ernie Avena ([id number]) on 03/27/2022 1:16:13 PM ? ?Radiology ?DG Chest 2 View ? ?Result Date: 03/27/2022 ?CLINICAL DATA:  Dizziness. EXAM: CHEST - 2 VIEW COMPARISON:  September 29, 2021. FINDINGS: The heart size and mediastinal contours are within normal limits. Hypoinflation of the lungs is noted with minimal bibasilar subsegmental atelectasis. The visualized skeletal structures are unremarkable. IMPRESSION: Hypoinflation of the lungs with minimal bibasilar subsegmental atelectasis. Electronically Signed   By: Lupita Raider M.D.   On: 03/27/2022 12:49  ? ?CT HEAD WO CONTRAST ? ?Result Date: 03/27/2022 ?CLINICAL DATA:  Acute neurologic deficit. Dizziness starting this morning. Nausea. EXAM: CT HEAD WITHOUT CONTRAST TECHNIQUE: Contiguous axial images were obtained from the base of the skull through the vertex without intravenous contrast. RADIATION DOSE REDUCTION: This exam was performed according to the departmental dose-optimization program which includes automated exposure control, adjustment of the mA and/or kV according to patient size and/or use of iterative reconstruction technique. COMPARISON:  09/20/2021 FINDINGS: Brain: Right frontal lobe encephalomalacia corresponding to region of prior acute hemorrhage shown  on 09/20/2021. 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 Periventricular white matter and corona radiata hypodensities favor chronic ischemic microvascular white matter disease. Otherwise, the brainst

## 2022-03-27 NOTE — ED Notes (Signed)
Pt is in MRI  

## 2022-03-27 NOTE — ED Triage Notes (Addendum)
Pt states he woke up last night to use his urinal when he noticed he was feeling dizzy. When he got up this morning he noticed the dizziness had not subsided. LKN 03/26/22 @ 2300  Pt also reports HTN. Resolution of sx at approximately 0930 this morning.   ?

## 2022-03-28 ENCOUNTER — Observation Stay (HOSPITAL_BASED_OUTPATIENT_CLINIC_OR_DEPARTMENT_OTHER): Payer: Medicare Other

## 2022-03-28 DIAGNOSIS — R42 Dizziness and giddiness: Secondary | ICD-10-CM | POA: Diagnosis not present

## 2022-03-28 DIAGNOSIS — G459 Transient cerebral ischemic attack, unspecified: Secondary | ICD-10-CM | POA: Diagnosis not present

## 2022-03-28 LAB — ECHOCARDIOGRAM COMPLETE
AR max vel: 3.22 cm2
AV Peak grad: 4.7 mmHg
Ao pk vel: 1.08 m/s
Area-P 1/2: 3.51 cm2
Height: 73 in
S' Lateral: 2.2 cm
Weight: 3220.48 oz

## 2022-03-28 MED ORDER — ACETAMINOPHEN 325 MG PO TABS
650.0000 mg | ORAL_TABLET | ORAL | Status: DC | PRN
Start: 2022-03-28 — End: 2022-03-29

## 2022-03-28 MED ORDER — ASPIRIN 81 MG PO CHEW
81.0000 mg | CHEWABLE_TABLET | Freq: Every day | ORAL | Status: DC
  Administered 2022-03-28 – 2022-03-29 (×2): 81 mg via ORAL
  Filled 2022-03-28 (×2): qty 1

## 2022-03-28 MED ORDER — ACETAMINOPHEN 160 MG/5ML PO SOLN: 650.0000 mg | ORAL | Status: DC | PRN

## 2022-03-28 MED ORDER — GABAPENTIN 100 MG PO CAPS
200.0000 mg | ORAL_CAPSULE | Freq: Two times a day (BID) | ORAL | Status: DC
  Administered 2022-03-28 – 2022-03-29 (×2): 200 mg via ORAL
  Filled 2022-03-28 (×2): qty 2

## 2022-03-28 MED ORDER — STROKE: EARLY STAGES OF RECOVERY BOOK
Freq: Once | Status: DC
  Filled 2022-03-28: qty 1

## 2022-03-28 MED ORDER — SIMVASTATIN 40 MG PO TABS
40.0000 mg | ORAL_TABLET | Freq: Every day | ORAL | Status: DC
  Administered 2022-03-28: 40 mg via ORAL
  Filled 2022-03-28: qty 1

## 2022-03-28 MED ORDER — PANTOPRAZOLE SODIUM 40 MG PO TBEC
40.0000 mg | DELAYED_RELEASE_TABLET | Freq: Every day | ORAL | Status: DC
  Administered 2022-03-28 – 2022-03-29 (×2): 40 mg via ORAL
  Filled 2022-03-28 (×2): qty 1

## 2022-03-28 MED ORDER — SODIUM CHLORIDE 0.9 % IV SOLN
1.0000 g | INTRAVENOUS | Status: DC
  Administered 2022-03-28: 1 g via INTRAVENOUS
  Filled 2022-03-28 (×2): qty 10

## 2022-03-28 MED ORDER — SENNOSIDES-DOCUSATE SODIUM 8.6-50 MG PO TABS: 1.0000 | ORAL_TABLET | Freq: Every evening | ORAL | Status: DC | PRN

## 2022-03-28 MED ORDER — ACETAMINOPHEN 650 MG RE SUPP: 650.0000 mg | RECTAL | Status: DC | PRN

## 2022-03-28 NOTE — Hospital Course (Signed)
71 y.o. male with medical history significant of previous SDH, ICH, HLD, GERD. Presenting with dizziness. He was last known to be normal around 2300hrs last night. He reports at 0200hrs this morning, he felt dizzy while trying to use the bathroom. He dismissed it and went back to sleep. This morning around 0830hrs, he was trying to get out of bed, but he felt dizzy again. He called for his family. They checked his BP and it was elevated. He felt nauseous, but didn't have any vomiting ?

## 2022-03-28 NOTE — Consult Note (Addendum)
Neurology Consultation ? ?Reason for Consult: dizziness ?Referring Physician: Dr. Armandina Gemma ? ?CC: dizziness  ? ?History is obtained from:patient and medical record  ? ?HPI: Raymond Stuart is a 71 y.o. male with past medical history of HLD, GERD, ICH, SDH, vertigo who presents to the [facility] ED for evaluation of dizziness. He states he got lightheaded @~0200 on 5/2 when he was getting up from bed to go to the bathroom. The lightheadedness lasted about 30 seconds to 1 minute and then passed. He then went back to bed and awoke @ 0800 again with lightheadedness/dizziness while going from a laying to sitting position. He had breakfast and then got lightheaded again and went to sit in the chair which the dizziness passed after 30 seconds. He says this is different than his typical vertigo where the room is spinning and he vomits. Yesterday he had a spell after getting up from the Mri table back to his wheel chair, again the lightheadedness lasted about 30 seconds and passed. He denies any vision changes, facial droop, weakness or numbness or tingling.  ?I did sit him up from laying position and he c/o lightheadedness for about 15-30 seconds and passed. WE walked in the room, gait is slow and unbalanced but he says that is his normal gait and sometimes uses a walker.  ? ? ?LKW: 03/27/2022 @ 0200 ?tpa given?: no, not a stroke  ?Premorbid modified Rankin scale (mRS):  ?1-No significant post stroke disability and can perform usual duties with stroke symptoms ? ?ROS: Full ROS was performed and is negative except as noted in the HPI.  ? ?Past Medical History:  ?Diagnosis Date  ? GERD (gastroesophageal reflux disease)   ? Hyperlipidemia   ? Joint pain   ? Kidney stone   ? ? ?Family History  ?Problem Relation Age of Onset  ? Hypertension Mother   ? ? ? ?Social History:  ? reports that he has never smoked. He has never used smokeless tobacco. He reports that he does not drink alcohol. No history on file for drug use. ? ?Medications ?No  current facility-administered medications for this encounter. ? ?Current Outpatient Medications:  ?  aspirin 81 MG chewable tablet, Chew 1 tablet (81 mg total) by mouth daily., Disp: , Rfl:  ?  Chlorphen-Pseudoephed-APAP (CORICIDIN D PO), Take 1 tablet by mouth daily as needed (allergies)., Disp: , Rfl:  ?  ELDERBERRY PO, Take 3,200 mg by mouth daily., Disp: , Rfl:  ?  esomeprazole (NEXIUM) 20 MG capsule, Take 40 mg by mouth daily at 12 noon., Disp: , Rfl:  ?  gabapentin (NEURONTIN) 300 MG capsule, Take 300 mg by mouth 2 (two) times daily., Disp: , Rfl:  ?  saccharomyces boulardii (FLORASTOR) 250 MG capsule, Take 1 capsule (250 mg total) by mouth 2 (two) times daily. (Patient taking differently: Take 250 mg by mouth daily.), Disp: 60 capsule, Rfl: 0 ?  simvastatin (ZOCOR) 40 MG tablet, Take 40 mg by mouth every other day., Disp: , Rfl:  ?  traMADol (ULTRAM) 50 MG tablet, Take 50 mg by mouth daily as needed for moderate pain., Disp: , Rfl:  ?  Vitamin D3 (VITAMIN D) 25 MCG tablet, Take 1 tablet (1,000 Units total) by mouth in the morning., Disp: 30 tablet, Rfl: 0 ?  diclofenac Sodium (VOLTAREN) 1 % GEL, Apply 2 g topically 4 (four) times daily as needed (Left shoulder pain). (Patient not taking: Reported on 03/27/2022), Disp: 100 g, Rfl: 0 ?  magnesium oxide (MAG-OX) 400 MG  tablet, Take 1 tablet (400 mg total) by mouth at bedtime. (Patient not taking: Reported on 03/27/2022), Disp: 30 tablet, Rfl: 0 ?  simvastatin (ZOCOR) 20 MG tablet, Take 1 tablet (20 mg total) by mouth daily. (Patient not taking: Reported on 03/27/2022), Disp: 30 tablet, Rfl: 3 ? ? ?Exam: ?Current vital signs: ?BP (!) 154/90   Pulse 72   Temp 97.9 ?F (36.6 ?C)   Resp 16   SpO2 95%  ?Vital signs in last 24 hours: ?Temp:  [97.9 ?F (36.6 ?C)-98.5 ?F (36.9 ?C)] 97.9 ?F (36.6 ?C) (05/02 1558) ?Pulse Rate:  [65-91] 72 (05/03 0600) ?Resp:  [13-25] 16 (05/03 0600) ?BP: (116-165)/(60-120) 154/90 (05/03 0600) ?SpO2:  [91 %-100 %] 95 % (05/03 0600) ? ?GENERAL:  Awake, alert in NAD ?HEENT: - Normocephalic and atraumatic, dry mm ?LUNGS - Clear to auscultation bilaterally with no wheezes ?CV - S1S2 RRR, no m/r/g, equal pulses bilaterally. ?ABDOMEN - Soft, nontender, nondistended with normoactive BS ?Ext: warm, well perfused, intact peripheral pulses, no edema ? ?NEURO:  ?Mental Status: AA&Ox4 ?Language: speech is clear Naming, repetition, fluency, and comprehension intact. ?Cranial Nerves: PERRL 73mm/brisk. EOMI, visual fields full, no facial asymmetry, facial sensation intact, hearing intact, tongue/uvula/soft palate midline, normal sternocleidomastoid and trapezius muscle strength. No evidence of tongue atrophy or fibrillations ?Motor: 5/5 in all 4 extremities ?Tone: is normal and bulk is normal ?Sensation- Intact to light touch bilaterally ?Coordination: FTN intact bilaterally, no ataxia in BLE. ?Gait- slow and unbalanced  ? ?NIHSS ? ?1a Level of Conscious.: 0 ?1b LOC Questions: 0 ?1c LOC Commands: 0 ?2 Best Gaze: 0 ?3 Visual: 0 ?4 Facial Palsy: 0 ?5a Motor Arm - left: 0 ?5b Motor Arm - Right: 0 ?6a Motor Leg - Left: 0 ?6b Motor Leg - Right: 0 ?7 Limb Ataxia: 0 ?8 Sensory: 0 ?9 Best Language: 0 ?10 Dysarthria: 0 ?11 Extinct. and Inatten.: 0 ?TOTAL: 0  ? ?Imaging ?I have reviewed the images obtained: ? ?CT-head 5/2: ?1. Encephalomalacia in the right frontal lobe compatible with region of prior contusion/hematoma. ?2. Small region of peripheral encephalomalacia in the right cerebellum compatible with remote infarct. ?3. Periventricular white matter and corona radiata hypodensities favor chronic ischemic microvascular white matter disease. ?4. Reduced conspicuity of the previous nondisplaced right occipital bone fracture due to interval healing response. ?5. No acute intracranial findings are identified. ?  ?MRI examination of the brain 5/2 ?MRA Head/ neck ?-No acute infarction, hemorrhage, or mass. ?-Sequelae of prior trauma and ischemia. ? -No large vessel occlusion or  hemodynamically significant stenosis. ?-No evidence of dissection. ? ?LABS: ?LDL-c 72 ?A1c 5.8 ?UDS negative  ?UA (+) Leuks, WBC >50 ?Ethanol negative  ? ?Assessment:  ?Raymond Stuart is a 71 y.o. male with past medical history of HLD, GERD, ICH, SDH, vertigo who presents to the [facility] ED for evaluation of dizziness. He states he got lightheaded @~0200 on 5/2 when he was getting up from bed to go to the bathroom. The lightheadedness lasted about 30 seconds to 1 minute and then passed. He then went back to bed and awoke @ 0800 again with lightheadedness/dizziness while going from a laying to sitting position. He had breakfast and then got lightheaded again and went to sit in the chair which the dizziness passed after 30 seconds. He says this is different than his typical vertigo where the room is spinning and he vomits. Yesterday he had a spell after getting up from the Mri table back to his wheel chair, again the lightheadedness  lasted about 30 seconds and passed. He denies any vision changes, facial droop, weakness or numbness or tingling.  ? ?Recommendations: ?- Check orthostatics ?- no need for TIA workup or transfer to [facility], since this is not a TIA/stroke ?- Secure chat with primary MD and updated  ? ?Beulah Gandy DNP, ACNPC-AG  ? ?I have seen the patient reviewed the above note. ? ?He describes very transient dizziness/lightheadedness with positional changes.  Dix-Hallpike is negative.  He says that it is slightly similar to when he had BPPV in the past. ? ?From his description, I think that TIA is very unlikely.  Orthostasis in the setting of urinary tract infection would be one possibility, as would recurrent BPPV.  Either way, care would be relatively supportive.  If he continues to have problems as an outpatient, could consider vestibular PT. ? ?I encouraged him to, at least at first, use a walker when getting around until he is certain he is going to be stable. ? ?No further neurodiagnostic testing at this time,  neurology will be available on as-needed basis. ? ?Roland Rack, MD ?[facility] ?[phone] ? ?If 7pm- 7am, please page neurology on call as listed in [place]. ? ? ?

## 2022-03-28 NOTE — Evaluation (Signed)
Physical Therapy Evaluation ?Patient Details ?Name: Raymond Stuart ?MRN: [id number] ?DOB: November 04, 1951 ?Today's Date: 03/28/2022 ? ?History of Present Illness ? Toy Snelgrove is a 71 y.o. male with past medical history of HLD, GERD, ICH, recent hospitalization for SDH and CVA in November 2022 (with 3 week inpt rehab stay), vertigo who presents to the [facility] ED for evaluation of dizziness. MRI negative for acute abnormality.  ?Clinical Impression ? Pt is mobilizing well at a modified independent level. He ambulated 180' with RW without loss of balance even with head turns/lifts while walking. He denied dizziness with position changes and with walking. No deficits in strength nor with sensation to light touch in BLEs. Coordination appears to be intact. No deficits noted during this session. No further PT indicated, will sign off.    ?   ? ?Recommendations for follow up therapy are one component of a multi-disciplinary discharge planning process, led by the attending physician.  Recommendations may be updated based on patient status, additional functional criteria and insurance authorization. ? ?Follow Up Recommendations No PT follow up ? ?  ?Assistance Recommended at Discharge PRN  ?Patient can return home with the following ? Assistance with cooking/housework;Help with stairs or ramp for entrance;Assist for transportation ? ?  ?Equipment Recommendations None recommended by PT  ?Recommendations for Other Services ?    ?  ?Functional Status Assessment Patient has not had a recent decline in their functional status  ? ?  ?Precautions / Restrictions Precautions ?Precautions: Fall ?Precaution Comments: intermitent mild dizziness ?Restrictions ?Weight Bearing Restrictions: No  ? ?  ? ?Mobility ? Bed Mobility ?  ?  ?  ?  ?  ?  ?  ?General bed mobility comments: up in recliner ?  ? ?Transfers ?Overall transfer level: Needs assistance ?Equipment used: Rolling walker (2 wheels) ?Transfers: Sit to/from Stand ?Sit to Stand: Supervision ?  ?   ?  ?  ?  ?General transfer comment: steady, no loss of balance, denied dizziness with sit to stand ?  ? ?Ambulation/Gait ?Ambulation/Gait assistance: Supervision ?Gait Distance (Feet): 180 Feet ?Assistive device: Rolling walker (2 wheels) ?Gait Pattern/deviations: WFL(Within Functional Limits) ?Gait velocity: WNL ?  ?  ?General Gait Details: steady, no loss of balance, no dizziness; no loss of balance nor dizziness with head turns nor with looking up/down while walking ? ?Stairs ?  ?  ?  ?  ?  ? ?Wheelchair Mobility ?  ? ?Modified Rankin (Stroke Patients Only) ?  ? ?  ? ?Balance Overall balance assessment: Modified Independent ?  ?  ?  ?  ?  ?  ?  ?  ?  ?  ?  ?  ?  ?  ?  ?  ?  ?  ?   ? ? ? ?Pertinent Vitals/Pain    ? ? ?Home Living Family/patient expects to be discharged to:: Private residence ?Living Arrangements: Spouse/significant other ?Available Help at Discharge: Family;Available 24 hours/day ?Type of Home: House ?Home Access: Level entry ?  ?  ?  ?Home Layout: One level ?Home Equipment: Advice worker (2 wheels) ?Additional Comments: finished OP PT in March. was off a device, mowing his yard and doing house repairs.  ?  ?Prior Function Prior Level of Function : Independent/Modified Independent ?  ?  ?  ?  ?  ?  ?Mobility Comments: was walking with a walking stick prior to admission ?ADLs Comments: independent ?  ? ? ?Hand Dominance  ? Dominant Hand: Right ? ?  ?Extremity/Trunk Assessment  ?  Upper Extremity Assessment ?Upper Extremity Assessment: Defer to OT evaluation ?RUE Deficits / Details: WNL ROM, arthrtiic changes in hand, 5/5 strength ?RUE Sensation: WNL ?RUE Coordination: WNL ?LUE Deficits / Details: WNL ROM, arthritic changes in hand, grossly 4+/5 strength ?LUE Sensation: WNL ?LUE Coordination: WNL ?  ? ?Lower Extremity Assessment ?Lower Extremity Assessment: Overall WFL for tasks assessed ?  ? ?Cervical / Trunk Assessment ?Cervical / Trunk Assessment: Normal  ?Communication  ?  Communication: No difficulties  ?Cognition Arousal/Alertness: Awake/alert ?Behavior During Therapy: [facility] for tasks assessed/performed ?Overall Cognitive Status: Within Functional Limits for tasks assessed ?  ?  ?  ?  ?  ?  ?  ?  ?  ?  ?  ?  ?  ?  ?  ?  ?  ?  ?  ? ?  ?General Comments   ? ?  ?Exercises    ? ?Assessment/Plan  ?  ?PT Assessment Patient does not need any further PT services  ?PT Problem List   ? ?   ?  ?PT Treatment Interventions     ? ?PT Goals (Current goals can be found in the Care Plan section)  ?Acute Rehab PT Goals ?Patient Stated Goal: golf ?PT Goal Formulation: All assessment and education complete, DC therapy ? ?  ?Frequency   ?  ? ? ?Co-evaluation   ?  ?  ?  ?  ? ? ?  ?AM-PAC PT "6 Clicks" Mobility  ?Outcome Measure Help needed turning from your back to your side while in a flat bed without using bedrails?: None ?Help needed moving from lying on your back to sitting on the side of a flat bed without using bedrails?: None ?Help needed moving to and from a bed to a chair (including a wheelchair)?: None ?Help needed standing up from a chair using your arms (e.g., wheelchair or bedside chair)?: None ?Help needed to walk in hospital room?: None ?Help needed climbing 3-5 steps with a railing? : None ?6 Click Score: 24 ? ?  ?End of Session Equipment Utilized During Treatment: Gait belt ?Activity Tolerance: Patient tolerated treatment well ?Patient left: in chair;with call bell/phone within reach;with family/visitor present ?Nurse Communication: Mobility status ?  ?  ? ?Time: [id number] ?PT Time Calculation (min) (ACUTE ONLY): 27 min ? ? ?Charges:   PT Evaluation ?$PT Eval Moderate Complexity: 1 Mod ?PT Treatments ?$Gait Training: 8-22 mins ?  ?   ? ?Blondell Reveal Kistler PT 03/28/2022  ?Acute Rehabilitation Services ?Pager [phone] ?Office [phone] ? ? ?

## 2022-03-28 NOTE — Progress Notes (Signed)
?  Progress Note ? ? ?Patient: Raymond Stuart HUT:[id number] DOB: 03/10/1951 DOA: 03/27/2022     0 ?DOS: the patient was seen and examined on 03/28/2022 ?  ?Brief hospital course: ?71 y.o. male with medical history significant of previous SDH, ICH, HLD, GERD. Presenting with dizziness. He was last known to be normal around 2300hrs last night. He reports at 0200hrs this morning, he felt dizzy while trying to use the bathroom. He dismissed it and went back to sleep. This morning around 0830hrs, he was trying to get out of bed, but he felt dizzy again. He called for his family. They checked his BP and it was elevated. He felt nauseous, but didn't have any vomiting ? ?Assessment and Plan: ?No notes have been filed under this hospital service. ?Service: Hospitalist ? ?Dizziness with TIA ruled out ?-Pt noted to have hx of prior CVA and ICH ?-Orthostatics neg ?-MRI neg for acute CVA. Pt also seen by Neurology with initial suspicion for BPPV, however symptoms could not be replicated ?-Of note, presenting UA is noted to have >50 WBC's. Pt denies dysuria or abd discomfort. ?-Suspect dizziness may be related to underlying UTI ?-Will check urine cx. ?-Chart reviewed. Pt has prior Klebsiella and Pseudomonas UTI, both sensitive to rocephin. Will cont empirically for now ?-Also consider polypharmacy as pt is noted to be on gabapentin prior to admit ?-Seen by PT/OT ?  ?HTN ?    - BP stable at present ?  ?HLD ?    - statin ?  ?GERD ?    - PPI ?  ? ?  ? ?Subjective: Still feeling dizzy on working with PT today ? ?Physical Exam: ?Vitals:  ? 03/28/22 0800 03/28/22 0929 03/28/22 1024 03/28/22 1330  ?BP: 140/74 (!) 146/83 (!) 146/83 133/75  ?Pulse: 74 77 77 78  ?Resp: 17 20 20 20   ?Temp: 97.8 ?F (36.6 ?C) 98.3 ?F (36.8 ?C) 98.3 ?F (36.8 ?C) 98.6 ?F (37 ?C)  ?TempSrc: Oral Oral Oral Oral  ?SpO2: 94% 98%  96%  ?Weight:   91.3 kg   ?Height:   6\' 1"  (1.854 m)   ? ?General exam: Awake, laying in bed, in nad ?Respiratory system: Normal respiratory  effort, no wheezing ?Cardiovascular system: regular rate, s1, s2 ?Gastrointestinal system: Soft, nondistended, positive BS ?Central nervous system: CN2-12 grossly intact, strength intact ?Extremities: Perfused, no clubbing ?Skin: Normal skin turgor, no notable skin lesions seen ?Psychiatry: Mood normal // no visual hallucinations  ? ?Data Reviewed: ? ?MRI reviewed: no acute infarct, hemorrhage, or mass  ? ?Family Communication: Pt in room, family at bedside ? ?Disposition: ?Status is: Observation ?The patient remains OBS appropriate and will d/c before 2 midnights. ? Planned Discharge Destination: Home ? ? ? ? ?Author: ? , MD ?03/28/2022 4:39 PM ? ?For on call review [email or web address].  ?

## 2022-03-28 NOTE — Evaluation (Addendum)
Occupational Therapy Evaluation ?Patient Details ?Name: Raymond Stuart ?MRN: [id number] ?DOB: 1951-07-07 ?Today's Date: 03/28/2022 ? ? ?History of Present Illness Raymond Stuart is a 71 y.o. male with past medical history of HLD, GERD, ICH, SDH, vertigo who presents to the [facility] ED for evaluation of dizziness. MRI negative for acute abnormality.  ? ?Clinical Impression ?  ?Raymond Stuart is a 71 year old man who presents today with complaints of light headedness. At baseline he reports he has been independent at home after completing OP PT in March. He has been able to push mow his yard and perform house hold repairs. He reports having less energy the last two weeks and dizziness yesterday. On evaluation he demonstrates his baseline UE strength and coordination and ability to perform ADLs. He requires min guard with ambulation with walker and hand hold without it for safety. He doesn't overtly lose his balance. Therapist had patient move his head in sitting and with walking in room. Other than mild "light headedness" testing was not provocative. No nystagmus was noted during transfers or head turns. From a functional standpoint he is at his baseline and has no OT needs.   ?   ? ?Recommendations for follow up therapy are one component of a multi-disciplinary discharge planning process, led by the attending physician.  Recommendations may be updated based on patient status, additional functional criteria and insurance authorization.  ? ?Follow Up Recommendations ? No OT follow up  ?  ?Assistance Recommended at Discharge PRN  ?Patient can return home with the following   ? ?  ?Functional Status Assessment ? Patient has not had a recent decline in their functional status  ?Equipment Recommendations ? None recommended by OT  ?  ?Recommendations for Other Services   ? ? ?  ?Precautions / Restrictions Precautions ?Precautions: Fall ?Precaution Comments: intermitent mild dizziness ?Restrictions ?Weight Bearing Restrictions: No  ? ?   ? ?Mobility Bed Mobility ?Overal bed mobility: Independent ?  ?  ?  ?  ?  ?  ?  ?  ? ?Transfers ?Overall transfer level: Needs assistance ?  ?Transfers: Sit to/from Stand ?Sit to Stand: Min guard ?  ?  ?  ?  ?  ?General transfer comment: Min guard to ambulate in room with RW and then with hand hold for safety. no overt loss of balance. mild complaints of light headedness with up and down head movement and with standing - but nothing prevocative with head movement or with supine to sit. No nystagmus noted. ?  ? ?  ?Balance Overall balance assessment: Mild deficits observed, not formally tested ?  ?  ?  ?  ?  ?  ?  ?  ?  ?  ?  ?  ?  ?  ?  ?  ?  ?  ?   ? ?ADL either performed or assessed with clinical judgement  ? ?ADL Overall ADL's : Independent ?  ?  ?  ?  ?  ?  ?  ?  ?  ?  ?  ?  ?  ?  ?  ?  ?  ?  ?  ?General ADL Comments: overall supervision for safety due to complanits of mild dizziness.  ? ? ? ?Vision Patient Visual Report: No change from baseline ?   ?   ?Perception   ?  ?Praxis   ?  ? ?Pertinent Vitals/Pain Pain Assessment ?Pain Assessment: No/denies pain  ? ? ? ?Hand Dominance Right ?  ?Extremity/Trunk Assessment  Upper Extremity Assessment ?Upper Extremity Assessment: RUE deficits/detail;LUE deficits/detail ?RUE Deficits / Details: WNL ROM, arthrtiic changes in hand, 5/5 strength ?RUE Sensation: WNL ?RUE Coordination: WNL ?LUE Deficits / Details: WNL ROM, arthritic changes in hand, grossly 4+/5 strength ?LUE Sensation: WNL ?LUE Coordination: WNL ?  ?Lower Extremity Assessment ?Lower Extremity Assessment: Defer to PT evaluation ?  ?Cervical / Trunk Assessment ?Cervical / Trunk Assessment: Normal ?  ?Communication Communication ?Communication: No difficulties ?  ?Cognition Arousal/Alertness: Awake/alert ?Behavior During Therapy: [facility] for tasks assessed/performed ?Overall Cognitive Status: Within Functional Limits for tasks assessed ?  ?  ?  ?  ?  ?  ?  ?  ?  ?  ?  ?  ?  ?  ?  ?  ?  ?  ?  ?General Comments    ? ?   ?Exercises   ?  ?Shoulder Instructions    ? ? ?Home Living Family/patient expects to be discharged to:: Private residence ?Living Arrangements: Spouse/significant other ?Available Help at Discharge: Family;Available 24 hours/day ?Type of Home: House ?Home Access: Level entry ?  ?  ?Home Layout: One level ?  ?  ?Bathroom Shower/Tub: Walk-in shower ?  ?Bathroom Toilet: Standard ?  ?  ?Home Equipment: Pharmacist, hospital (2 wheels) ?  ?Additional Comments: finished OP PT in March. was off a device, mowing his yard and doing house repairs. ?  ? ?  ?Prior Functioning/Environment Prior Level of Function : Independent/Modified Independent ?  ?  ?  ?  ?  ?  ?  ?  ?  ? ?  ?  ?OT Problem List: Impaired balance (sitting and/or standing) ?  ?   ?OT Treatment/Interventions:    ?  ?OT Goals(Current goals can be found in the care plan section) Acute Rehab OT Goals ?OT Goal Formulation: All assessment and education complete, DC therapy  ?OT Frequency:   ?  ? ?Co-evaluation   ?  ?  ?  ?  ? ?  ?AM-PAC OT "6 Clicks" Daily Activity     ?Outcome Measure Help from another person eating meals?: None ?Help from another person taking care of personal grooming?: None ?Help from another person toileting, which includes using toliet, bedpan, or urinal?: None ?Help from another person bathing (including washing, rinsing, drying)?: None ?Help from another person to put on and taking off regular upper body clothing?: None ?Help from another person to put on and taking off regular lower body clothing?: None ?6 Click Score: 24 ?  ?End of Session Equipment Utilized During Treatment: Rolling walker (2 wheels) ?Nurse Communication: Mobility status ? ?Activity Tolerance: Patient tolerated treatment well ?Patient left: in chair;with call bell/phone within reach;with family/visitor present ? ?OT Visit Diagnosis: Dizziness and giddiness (R42)  ?              ?Time: 4166-0630 ?OT Time Calculation (min): 17 min ?Charges:  OT General Charges ?$OT  Visit: 1 Visit ?OT Evaluation ?$OT Eval Low Complexity: 1 Low ? ?Raymond Stuart, OTR/L ?Acute Care Rehab Services  ?Office [phone] ?Pager: [phone]  ? ?Joanathan Affeldt L Tryce Surratt ?03/28/2022, 12:26 PM ?

## 2022-03-28 NOTE — Evaluation (Signed)
SLP Cancellation Note ? ?Patient Details ?Name: Raymond Stuart ?MRN: [id number] ?DOB: 1951/06/07 ? ? ?Cancelled treatment:       Reason Eval/Treat Not Completed: Other (comment);Patient at procedure or test/unavailable (neurologist currently in room with pt and family, will continue efforts) ?Kathleen Lime, MS CCC SLP ?Acute Rehab Services ?Office [phone] ?Pager [phone] ? ? ?Raymond Stuart ?03/28/2022, 12:27 PM ? ? ? ?

## 2022-03-28 NOTE — Plan of Care (Signed)
?  Problem: Education: ?Goal: Knowledge of disease or condition will improve ?Outcome: Progressing ?Goal: Knowledge of patient specific risk factors will improve (INDIVIDUALIZE FOR PATIENT) ?Outcome: Progressing ?  ?Problem: Education: ?Goal: Knowledge of General Education information will improve ?Description: Including pain rating scale, medication(s)/side effects and non-pharmacologic comfort measures ?Outcome: Progressing ?  ?

## 2022-03-29 DIAGNOSIS — R42 Dizziness and giddiness: Secondary | ICD-10-CM | POA: Diagnosis not present

## 2022-03-29 DIAGNOSIS — Z8673 Personal history of transient ischemic attack (TIA), and cerebral infarction without residual deficits: Secondary | ICD-10-CM | POA: Diagnosis not present

## 2022-03-29 DIAGNOSIS — G459 Transient cerebral ischemic attack, unspecified: Secondary | ICD-10-CM | POA: Diagnosis not present

## 2022-03-29 LAB — COMPREHENSIVE METABOLIC PANEL
ALT: 16 U/L (ref 0–44)
AST: 16 U/L (ref 15–41)
Albumin: 3.4 g/dL — ABNORMAL LOW (ref 3.5–5.0)
Alkaline Phosphatase: 52 U/L (ref 38–126)
Anion gap: 8 (ref 5–15)
BUN: 17 mg/dL (ref 8–23)
CO2: 24 mmol/L (ref 22–32)
Calcium: 8.9 mg/dL (ref 8.9–10.3)
Chloride: 110 mmol/L (ref 98–111)
Creatinine, Ser: 1.17 mg/dL (ref 0.61–1.24)
GFR, Estimated: >60 mL/min (ref >60)
Glucose, Bld: 101 mg/dL — ABNORMAL HIGH (ref 70–99)
Potassium: 4.1 mmol/L (ref 3.5–5.1)
Sodium: 142 mmol/L (ref 135–145)
Total Bilirubin: 0.5 mg/dL (ref 0.3–1.2)
Total Protein: 6.8 g/dL (ref 6.5–8.1)

## 2022-03-29 LAB — LIPID PANEL
Cholesterol: 141 mg/dL (ref 0–200)
HDL: 31 mg/dL — ABNORMAL LOW (ref >40)
LDL Cholesterol: 81 mg/dL (ref 0–99)
Total CHOL/HDL Ratio: 4.5 RATIO
Triglycerides: 145 mg/dL (ref <150)
VLDL: 29 mg/dL (ref 0–40)

## 2022-03-29 LAB — CBC
HCT: 45.4 % (ref 39.0–52.0)
Hemoglobin: 14.4 g/dL (ref 13.0–17.0)
MCH: 27.7 pg (ref 26.0–34.0)
MCHC: 31.7 g/dL (ref 30.0–36.0)
MCV: 87.5 fL (ref 80.0–100.0)
Platelets: 218 10*3/uL (ref 150–400)
RBC: 5.19 MIL/uL (ref 4.22–5.81)
RDW: 14.6 % (ref 11.5–15.5)
WBC: 9.6 10*3/uL (ref 4.0–10.5)
nRBC: 0 % (ref 0.0–0.2)

## 2022-03-29 LAB — PROCALCITONIN: Procalcitonin: <0.1 ng/mL

## 2022-03-29 MED ORDER — GABAPENTIN 100 MG PO CAPS: 200.0000 mg | ORAL_CAPSULE | Freq: Two times a day (BID) | ORAL | 0 refills | Status: AC

## 2022-03-29 NOTE — Discharge Summary (Signed)
?Physician Discharge Summary ?  ?Patient: Raymond Stuart MRN: [id number] DOB: 02/19/1951  ?Admit date:     03/27/2022  ?Discharge date: 03/29/22  ?Discharge Physician: Pinchos Barbara  ? ?PCP: Caffie Damme, MD  ? ?Recommendations at discharge:  ? ? Follow up with PCP in 1-2 weeks ?Please focus on any UTI symptoms (see below). Klebsiella bacteruria noted this visit. Pt instructed to contact PCP should he develop symptoms ?Follow up with primary Orthopedic specialist as already scheduled ?Please note, patient's gabapentin had been decreased from 300mg  bid to 200mg  bid secondary to dizziness ?Consider outpatient referral to Urology for frequent urination ? ?Discharge Diagnoses: ?Principal Problem: ?  TIA (transient ischemic attack) ?Active Problems: ?  GERD (gastroesophageal reflux disease) ?  Hyperlipidemia ?  HTN (hypertension) ?  History of stroke ? ?Resolved Problems: ?  * No resolved hospital problems. * ? ?Hospital Course: ?71 y.o. male with medical history significant of previous SDH, ICH, HLD, GERD. Presenting with dizziness. He was last known to be normal around 2300hrs last night. He reports at 0200hrs this morning, he felt dizzy while trying to use the bathroom. He dismissed it and went back to sleep. This morning around 0830hrs, he was trying to get out of bed, but he felt dizzy again. He called for his family. They checked his BP and it was elevated. He felt nauseous, but didn't have any vomiting ? ?Assessment and Plan: ?No notes have been filed under this hospital service. ?Service: Hospitalist ?Dizziness with TIA ruled out ?-Pt noted to have hx of prior CVA and ICH ?-Orthostatics neg ?-MRI neg for acute CVA. Pt also seen by Neurology with initial suspicion for BPPV, however symptoms could not be replicated ?-Of note, presenting UA is noted to have >50 WBC's. Pt denies dysuria or abd discomfort, see below ?-Also consider polypharmacy as pt is noted to be on gabapentin prior to admit ?-Home gabapentin decreased to  200mg  bid from 300mg  bid ?  ?HTN ?    - BP remained stable this visit ?  ?HLD ?    - continued on statin ?  ?GERD ?    - Continued on PPI ? ?Klebsiella bacteruria ?- Presenting UA with >50 WBC's however pt noted to be afebrile with no leukocytosis. Pt also without dysuria or abd discomfort ?- Pro-calcitonin neg at <0.1 ?- Thus far urine cx pos for >100,000 klebsiella. Discussed case with ID on call. Given lack of symptoms or objective signs of infection, ID recommendation to hold off on further abx ?- Have updated patient on above recommendations and pt agrees. Pt is aware to contact his PCP should he develop any signs of UTI such as dysuria or fevers ?  ? ?  ? ? ?Consultants: Neurology ?Procedures performed:   ?Disposition: Home ?Diet recommendation:  ?Cardiac diet ?DISCHARGE MEDICATION: ?Allergies as of 03/29/2022   ? ?   Reactions  ? Gabapentin Other (See Comments)  ? Was stopped by a hospitalist because of the unwanted possible side effect of drowsiness  ? Statins Other (See Comments)  ? Unnamed statin was stopped by a hospitalist because liver enzymes became elevated- possible contraindication  ? ?  ? ?  ?Medication List  ?  ? ?STOP taking these medications   ? ?diclofenac Sodium 1 % Gel ?Commonly known as: VOLTAREN ?  ?magnesium oxide 400 MG tablet ?Commonly known as: MAG-OX ?  ? ?  ? ?TAKE these medications   ? ?aspirin 81 MG chewable tablet ?Chew 1 tablet (81 mg total) by  mouth daily. ?  ?CORICIDIN D PO ?Take 1 tablet by mouth daily as needed (allergies). ?  ?ELDERBERRY PO ?Take 3,200 mg by mouth daily. ?  ?esomeprazole 20 MG capsule ?Commonly known as: NEXIUM ?Take 40 mg by mouth daily at 12 noon. ?  ?Florastor 250 MG capsule ?Generic drug: saccharomyces boulardii ?Take 1 capsule (250 mg total) by mouth 2 (two) times daily. ?What changed: when to take this ?  ?gabapentin 100 MG capsule ?Commonly known as: NEURONTIN ?Take 2 capsules (200 mg total) by mouth 2 (two) times daily. ?What changed:  ?medication  strength ?how much to take ?  ?simvastatin 40 MG tablet ?Commonly known as: ZOCOR ?Take 40 mg by mouth every other day. ?What changed: Another medication with the same name was removed. Continue taking this medication, and follow the directions you see here. ?  ?traMADol 50 MG tablet ?Commonly known as: ULTRAM ?Take 50 mg by mouth daily as needed for moderate pain. ?  ?Vitamin D3 25 MCG tablet ?Commonly known as: Vitamin D ?Take 1 tablet (1,000 Units total) by mouth in the morning. ?  ? ?  ? ? Follow-up Information   ? ? Caffie Damme, MD. Schedule an appointment as soon as possible for a visit in 2 week(s).   ?Specialty: Family Medicine ?Why: Hospital follow up ?Contact information: ?[address] ?[place] [address] ?[phone] ? ? ?  ?  ? ? Adelfa Koh, MD Follow up.   ?Specialty: Orthopedic Surgery ?Why: as scheduled ?Contact information: ?[address] ?[address] ?[place] [address] ?[phone] ? ? ?  ?  ? ?  ?  ? ?  ? ?Discharge Exam: ?Filed Weights  ? 03/28/22 1024  ?Weight: 91.3 kg  ? ?General exam: Awake, laying in bed, in nad ?Respiratory system: Normal respiratory effort, no wheezing ?Cardiovascular system: regular rate, s1, s2 ?Gastrointestinal system: Soft, nondistended, positive BS ?Central nervous system: CN2-12 grossly intact, strength intact ?Extremities: Perfused, no clubbing ?Skin: Normal skin turgor, no notable skin lesions seen ?Psychiatry: Mood normal // no visual hallucinations  ? ?Condition at discharge: fair ? ?The results of significant diagnostics from this hospitalization (including imaging, microbiology, ancillary and laboratory) are listed below for reference.  ? ?Imaging Studies: ?DG Chest 2 View ? ?Result Date: 03/27/2022 ?CLINICAL DATA:  Dizziness. EXAM: CHEST - 2 VIEW COMPARISON:  September 29, 2021. FINDINGS: The heart size and mediastinal contours are within normal limits. Hypoinflation of the lungs is noted with minimal bibasilar subsegmental atelectasis.  The visualized skeletal structures are unremarkable. IMPRESSION: Hypoinflation of the lungs with minimal bibasilar subsegmental atelectasis. Electronically Signed   By: Lupita Raider M.D.   On: 03/27/2022 12:49  ? ?CT HEAD WO CONTRAST ? ?Result Date: 03/27/2022 ?CLINICAL DATA:  Acute neurologic deficit. Dizziness starting this morning. Nausea. EXAM: CT HEAD WITHOUT CONTRAST TECHNIQUE: Contiguous axial images were obtained from the base of the skull through the vertex without intravenous contrast. RADIATION DOSE REDUCTION: This exam was performed according to the departmental dose-optimization program which includes automated exposure control, adjustment of the mA and/or kV according to patient size and/or use of iterative reconstruction technique. COMPARISON:  09/20/2021 FINDINGS: Brain: Right frontal lobe encephalomalacia corresponding to region of prior acute hemorrhage shown on 09/20/2021. 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 Periventricular white matter and corona radiata hypodensities favor chronic ischemic microvascular white matter disease. Otherwise, the brainstem, cerebellum, cerebral peduncles, thalamus, basal ganglia, basilar cisterns, and ventricular system appear within normal limits. No intracranial hemorrhage,  mass lesion, or acute CVA. The previous small right-sided subdural hematoma is no longer appreciated. Vascular: Unremarkable Skull: Reduced conspicuity of the prior nondisplaced right occipital bone fracture due to interval healing response. Sinuses/Orbits: Chronic left sphenoid, left frontal, right maxillary, and left ethmoid sinusitis. Other: No supplemental non-categorized findings. IMPRESSION: 1. Encephalomalacia in the right frontal lobe compatible with region of prior contusion/hematoma. 2. Small region of peripheral encephalomalacia in the right cerebellum compatible with remote infarct. 3. Periventricular white matter and corona radiata hypodensities favor chronic  ischemic microvascular white matter disease. 4. Reduced conspicuity of the previous nondisplaced right occipital bone fracture due to interval healing response. 5. No acute intracranial findings are identified.

## 2022-03-29 NOTE — Plan of Care (Signed)

## 2022-03-29 NOTE — TOC Transition Note (Signed)
Transition of Care (TOC) - CM/SW Discharge Note ? ? ?Patient Details  ?Name: Bridger Pizzi ?MRN: [id number] ?Date of Birth: 01/17/1951 ? ?[facility]) CM/SW Contact:  ?Golda Acre, RN ?Phone Number: ?03/29/2022, 1:32 PM ? ? ?Clinical Narrative:    ? ?Transition of Care (TOC) Screening Note ? ? ?Patient Details  ?Name: Oluwatobi Visser ?Date of Birth: 1951-07-21 ? ? ?Transition of Care [facility]) CM/SW Contact:    ?Golda Acre, RN ?Phone Number: ?03/29/2022, 1:32 PM ? ? ? ?Transition of Care Department [facility]) has reviewed patient and no TOC needs have been identified at this time. We will continue to monitor patient advancement through interdisciplinary progression rounds. If new patient transition needs arise, please place a TOC consult. ? ? ? ? ?Final next level of care: Home/Self Care ?Barriers to Discharge: No Barriers Identified ? ? ?Patient Goals and CMS Choice ?Patient states their goals for this hospitalization and ongoing recovery are:: to go home ?CMS Medicare.gov Compare Post Acute Care list provided to:: Patient ?  ? ?Discharge Placement ?  ?           ?  ?  ?  ?  ? ?Discharge Plan and Services ?  ?Discharge Planning Services: CM Consult ?           ?  ?  ?  ?  ?  ?  ?  ?  ?  ?  ? ?Social Determinants of Health (SDOH) Interventions ?  ? ? ?Readmission Risk Interventions ?   ? View : No data to display.  ?  ?  ?  ? ? ? ? ? ?

## 2022-03-30 LAB — URINE CULTURE: Culture: >=100000 — AB

## 2023-02-24 IMAGING — DX DG ABDOMEN 1V
1 series · 1 of 1 positions shown · non-contrast
Comparison: None.

CLINICAL DATA: Check gastrostomy tube

EXAM:
ABDOMEN - 1 VIEW

[abdomen]
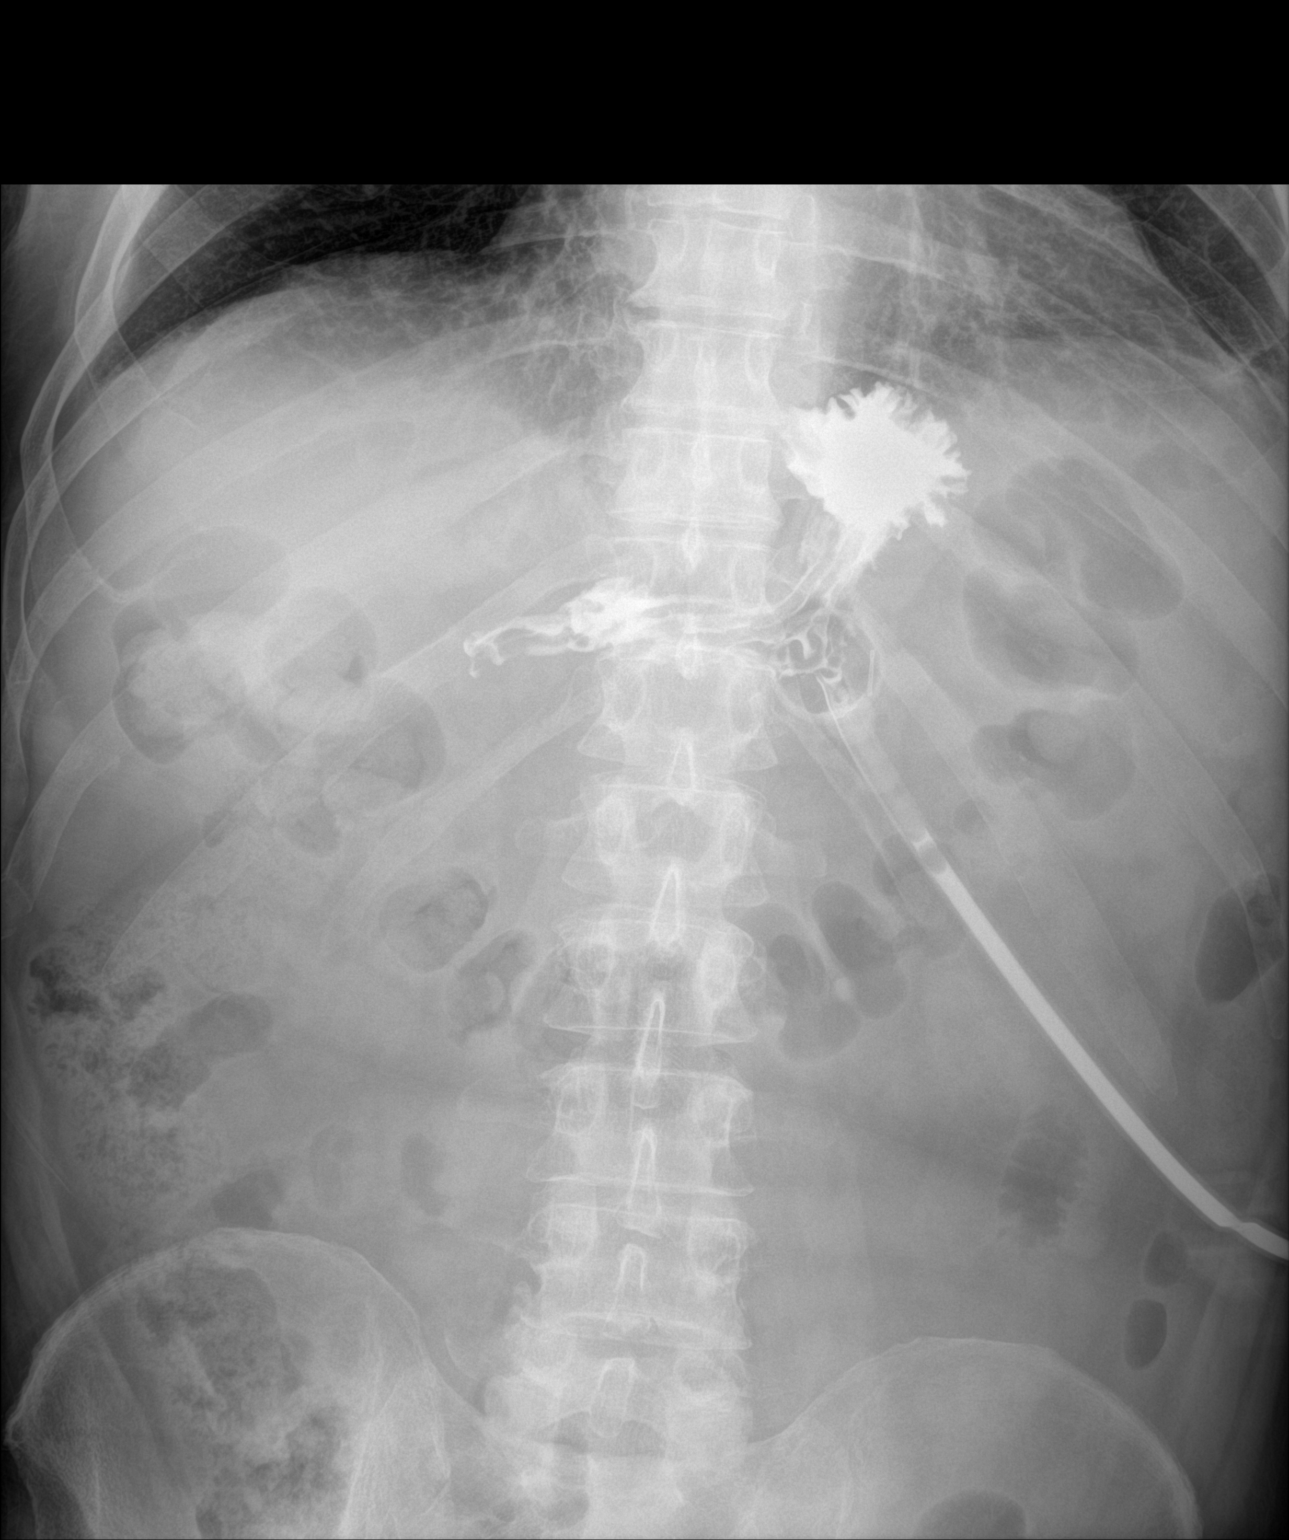

[1 of 1 positions shown; findings below may reference images not displayed]

FINDINGS: Contrast injected through the gastrostomy tube is seen in the lumen
of stomach. There is no demonstrable extravasation of contrast.
Bowel gas pattern is nonspecific.
IMPRESSION: Tip of gastrostomy tube appears to be in the lumen of stomach.

## 2023-07-20 IMAGING — CT CT HEAD W/O CM
3 series · 15 of 47 positions shown, 18 images · non-contrast
Comparison: 09/20/2021

CLINICAL DATA: Acute neurologic deficit. Dizziness starting this
morning. Nausea.



[Series 2: head wo · axial · 0.43mm/px · z∈[-114,+21]mm · 9 of 33 slices shown, 12 images]
[im 3/33  brain]
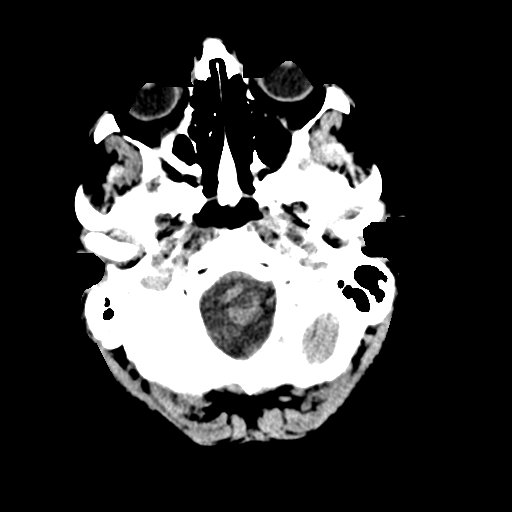
[im 3/33  bone]
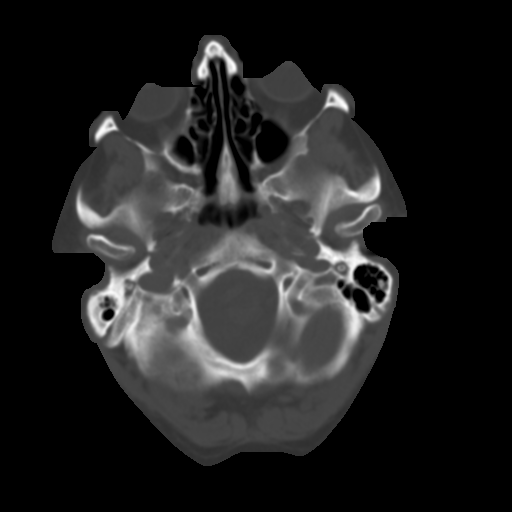
[im 6/33  brain]
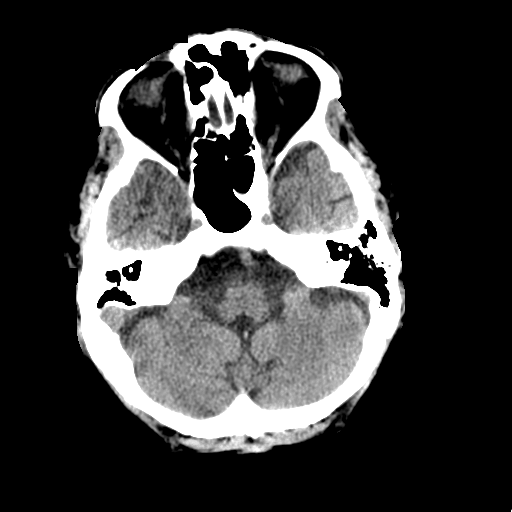
[im 9/33  brain]
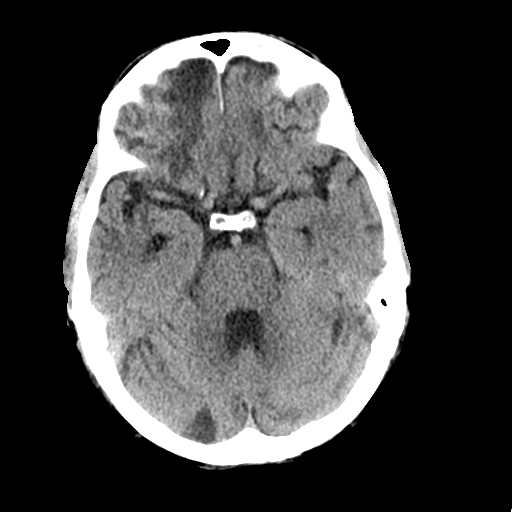
[im 13/33  brain]
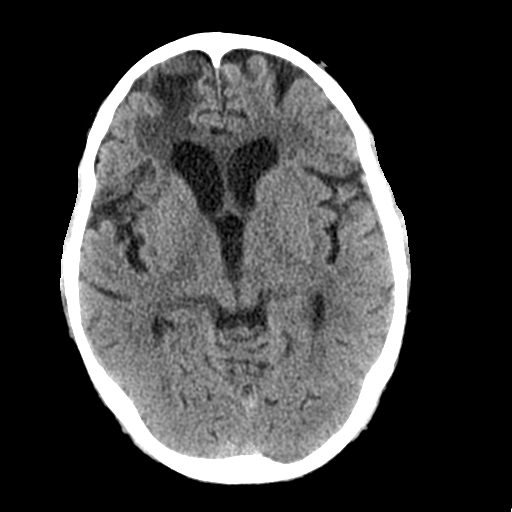
[im 17/33  brain]
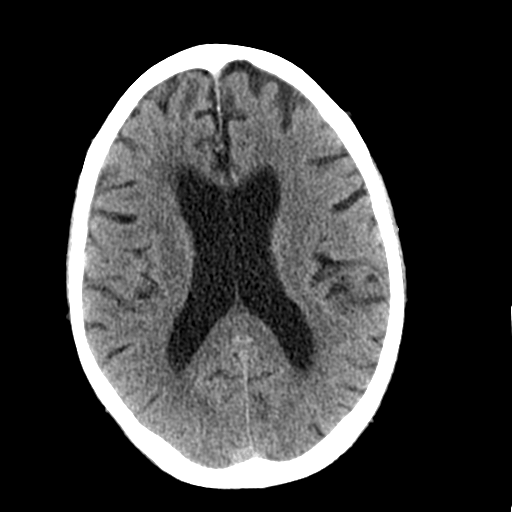
[im 17/33  bone]
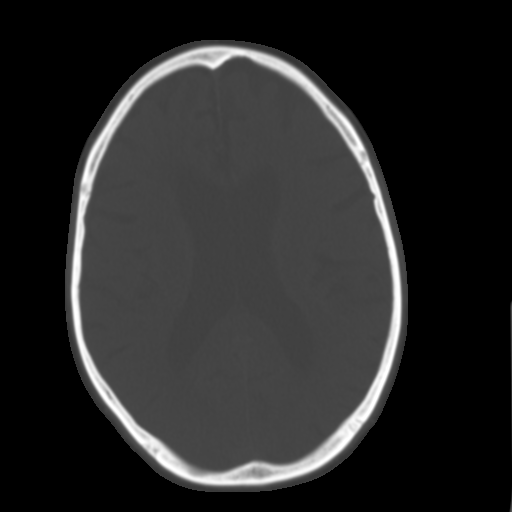
[im 20/33  brain]
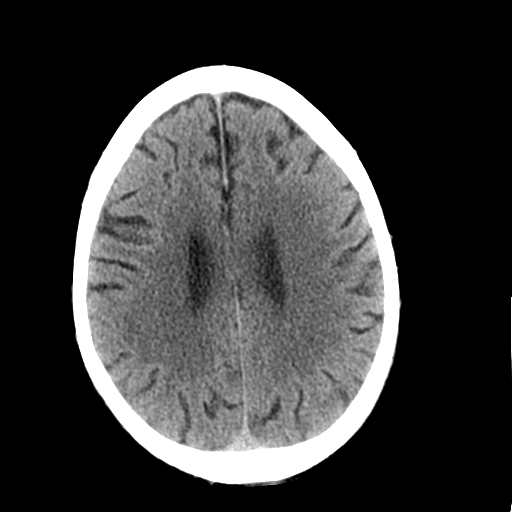
[im 24/33  brain]
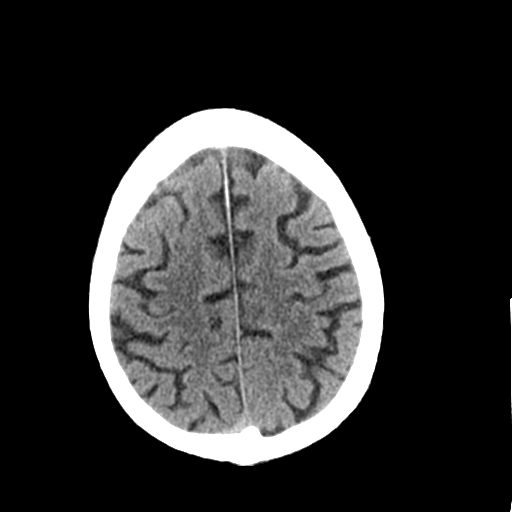
[im 27/33  brain]
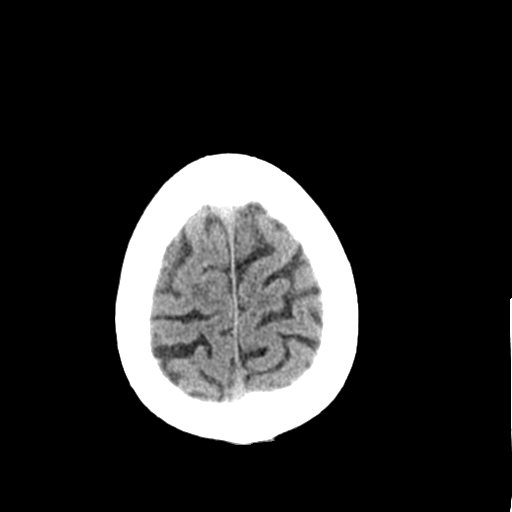
[im 30/33  brain]
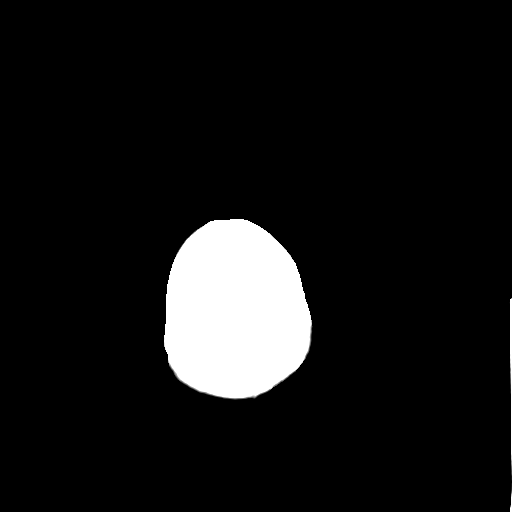
[im 30/33  bone]
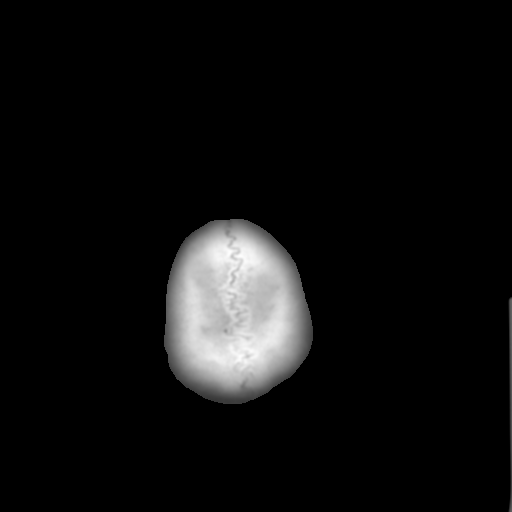

[Series 5: coronal soft tissue · coronal · 0.31mm/px · 3 of 70 slices shown]
[im 24/70  brain]
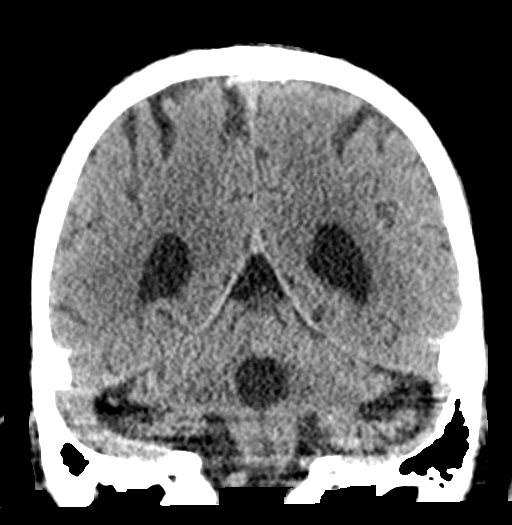
[im 31/70  brain]
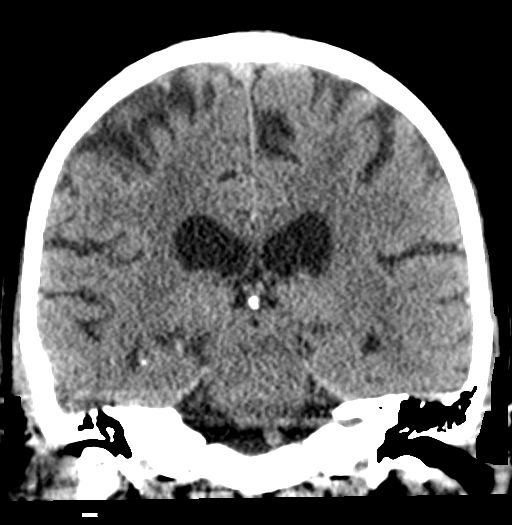
[im 39/70  brain]
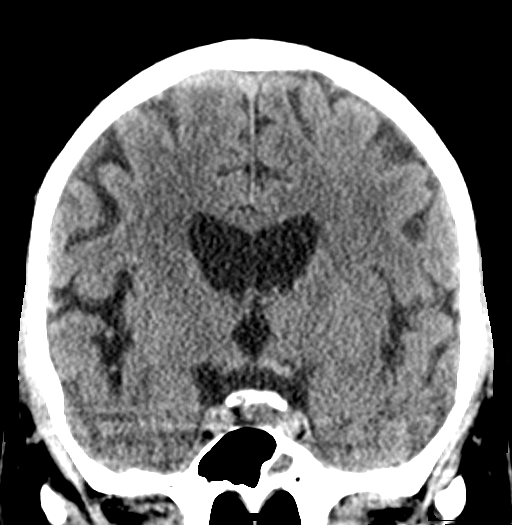

[Series 6: sagittal soft tissue · sagittal · 0.32mm/px · 3 of 53 slices shown]
[im 18/53  brain]
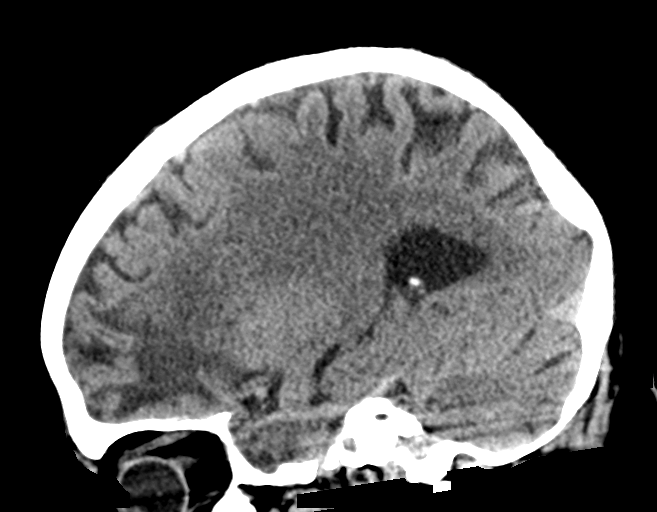
[im 27/53  brain]
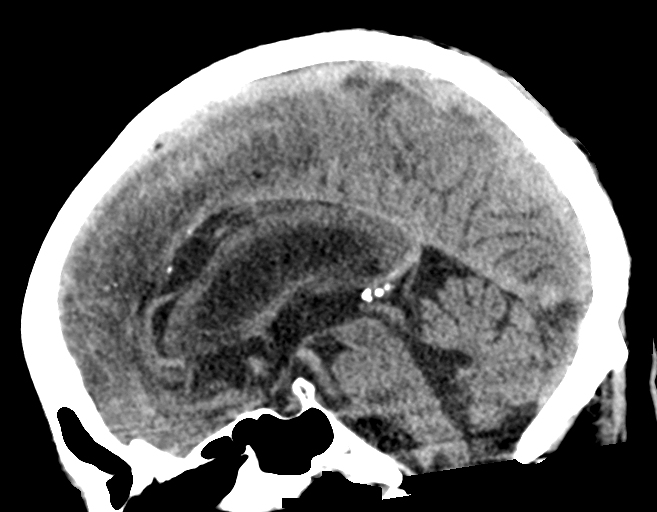
[im 35/53  brain]
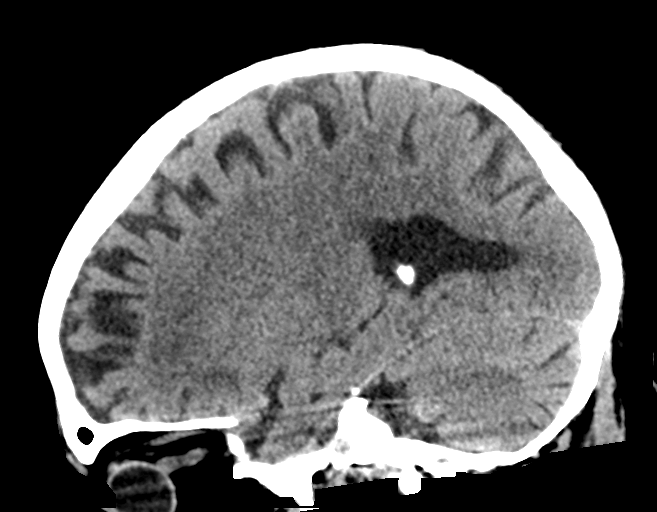

[15 of 47 positions shown; findings below may reference images not displayed]

FINDINGS: Brain: Right frontal lobe encephalomalacia corresponding to region
of prior acute hemorrhage shown on 09/20/2021.

Remote right cerebellar infarct on image 9 series 2.

Periventricular white matter and corona radiata hypodensities favor
chronic ischemic microvascular white matter disease.

Otherwise, the brainstem, cerebellum, cerebral peduncles, thalamus,
basal ganglia, basilar cisterns, and ventricular system appear
within normal limits. No intracranial hemorrhage, mass lesion, or
acute CVA. The previous small right-sided subdural hematoma is no
longer appreciated.

Vascular: Unremarkable

Skull: Reduced conspicuity of the prior nondisplaced right occipital
bone fracture due to interval healing response.

Sinuses/Orbits: Chronic left sphenoid, left frontal, right
maxillary, and left ethmoid sinusitis.

Other: No supplemental non-categorized findings.
IMPRESSION: 1. Encephalomalacia in the right frontal lobe compatible with region
of prior contusion/hematoma.
2. Small region of peripheral encephalomalacia in the right
cerebellum compatible with remote infarct.
3. Periventricular white matter and corona radiata hypodensities
favor chronic ischemic microvascular white matter disease.
4. Reduced conspicuity of the previous nondisplaced right occipital
bone fracture due to interval healing response.
5. No acute intracranial findings are identified.
